# Patient Record
Sex: Female | Born: 1960
Health system: Southern US, Community
[De-identification: ages and names within clinical notes are randomized; demographics above are authoritative.]

## PROBLEM LIST (undated history)

## (undated) DIAGNOSIS — N189 Chronic kidney disease, unspecified: Secondary | ICD-10-CM

## (undated) DIAGNOSIS — Z9289 Personal history of other medical treatment: Secondary | ICD-10-CM

## (undated) DIAGNOSIS — E785 Hyperlipidemia, unspecified: Secondary | ICD-10-CM

## (undated) DIAGNOSIS — I219 Acute myocardial infarction, unspecified: Secondary | ICD-10-CM

## (undated) DIAGNOSIS — Z8489 Family history of other specified conditions: Secondary | ICD-10-CM

## (undated) DIAGNOSIS — E669 Obesity, unspecified: Secondary | ICD-10-CM

## (undated) DIAGNOSIS — I2510 Atherosclerotic heart disease of native coronary artery without angina pectoris: Secondary | ICD-10-CM

## (undated) DIAGNOSIS — I10 Essential (primary) hypertension: Secondary | ICD-10-CM

## (undated) DIAGNOSIS — G4730 Sleep apnea, unspecified: Secondary | ICD-10-CM

## (undated) DIAGNOSIS — I509 Heart failure, unspecified: Secondary | ICD-10-CM

## (undated) DIAGNOSIS — G4733 Obstructive sleep apnea (adult) (pediatric): Secondary | ICD-10-CM

## (undated) DIAGNOSIS — E119 Type 2 diabetes mellitus without complications: Secondary | ICD-10-CM

## (undated) HISTORY — DX: Obesity, unspecified: E66.9

## (undated) HISTORY — DX: Obstructive sleep apnea (adult) (pediatric): G47.33

## (undated) HISTORY — PX: COLONOSCOPY: SHX174

## (undated) NOTE — *Deleted (*Deleted)
***In Progress*** PCP:  Marcine Matar, MD         Cardiologist:  No primary care provider on file. Primary HF: Dr. Shirlee Latch  HPI:   Misty Miller is a 27 y.o. female who has poorly controlled HTN and CHF. She has had HTN and diabetes for years. She was admitted to [facility] in 2/16 with a hypertensive emergency and flash pulmonary edema. She had been off irbesartan for months. She was initially given IV furosemide x1 and started back on irbesartan. Creatinine rose from 0.95 at admission to 2.71. Irbesartan and furosemide were stopped. Echo was done, showing EF 35-40% with basal inferior akinesis and mild to moderate MR. Troponin was mildly elevated to peak 0.35. Due to elevated creatinine, she did not have cardiac cath initially. Lexiscan Cardiolite was done, showing EF 35% but no ischemia or infarction. V/Q scan was normal. BP was controlled and she was sent home. In 12/2014, she finally had cardiac cath showing severe RCA stenosis that was treated with DES. Echo in 11/2015 showed improvement in EF back to 55%, echo in 5/18 with EF 55-60%.   She returned for followup of CHF and HTN with Dr. Shirlee Latch on 04/14/20.  BP was high. She had been out of all her meds for about 2 weeks. SBP was running in the 130s when she was on her meds.  She denied exertional dyspnea or chest pain.  No orthopnea/PND. No palpitations.  She reported using CPAP occasionally but not regularly.   Today she returns to HF clinic for pharmacist medication titration. At last visit with Dr. Shirlee Latch on 04/14/20, carvedilol 25 mg BID, spironolactone 50 mg daily, hydralazine 75 mg TID, isosorbide mononitrate 60 mg daily, and furosemide 40 mg daily was re-initiated.    Overall feeling ***. Dizziness, lightheadedness, fatigue:  Chest pain or palpitations.  How is your breathing?: *** SOB Able to complete all ADLs. Activity level ***  Weight at home pounds. Takes furosemide/torsemide/bumex *** mg *** daily.  PND/Orthopnea:    Appetite ***.   HF Medications: Carvedilol 25 mg BID  Hydralazine 75 mg TID  Isosorbide mononitrate 60 mg daily  Spironolactone 50 mg daily  Furosemide 40 mg daily  Has the patient been experiencing any side effects to the medications prescribed?  {YES NO:22349}  Does the patient have any problems obtaining medications due to transportation or finances?   {YES NO:22349} Uninsured at beginning of year, supposed to have insurance per endo note  Understanding of regimen: {excellent/good/fair/poor:19665} Understanding of indications: {excellent/good/fair/poor:19665} Potential of compliance: {excellent/good/fair/poor:19665} Patient understands to avoid NSAIDs. Patient understands to avoid decongestants.    Pertinent Lab Values 04/24/20: Serum creatinine 1.19, BUN 13, Potassium 4.4, Sodium 134  Vital Signs: . Weight: *** (last clinic weight: 183 lbs) . Blood pressure: ***  . Heart rate: ***   Assessment:  1. 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 HTN has been treated and she is s/p DES to RCA, and EF on 5/17 echo was improved back to 55%. Echo in 5/18 was similar with EF 55-60%. She is not volume overloaded on exam.  -Continue furosemide 40 mg daily  - Continue spironolactone 50 mg daily  - Continue hydralazine 75 mg TID  - Continue isosorbide mononitrate 60 mg daily  2. CAD: LHC 12/23/14 showed severe proximal RCA stenosis treated with Promus DES. She was a Plavix non-responder so was transitioned to Brilinta 90 mg bid =>now off Brilinta. No chest pain.  -  Continue ASA 81.  - Continue atorvastatin 80 mg daily   3. HTN: She had marked AKI with ARB use in the past. Continue her meds as above   4. CKD: AKI in past after starting ARB, but fully recovered. No evidence for renal artery stenosis on renal artery dopplers. Suspect some baseline renal damage from long-standing HTN and  diabetes but recent creatinine within normal range   5. OSA: Asked her to be more regularly with her CPAP   6. Diabetes: She has followup with endocrinology.     Plan: 1) Medication changes: Based on clinical presentation, vital signs and recent labs will *** 2) Labs: *** 3) Follow-up: ***   Karle Plumber, PharmD, BCPS, BCCP, CPP Heart Failure Clinic Pharmacist [phone]

---

## 1990-07-11 DIAGNOSIS — Z9289 Personal history of other medical treatment: Secondary | ICD-10-CM

## 1990-07-11 HISTORY — DX: Personal history of other medical treatment: Z92.89

## 1998-01-21 ENCOUNTER — Encounter: Admission: RE | Admit: 1998-01-21 | Discharge: 1998-01-21 | Payer: Self-pay | Admitting: *Deleted

## 1999-03-12 HISTORY — PX: TUMOR EXCISION: SHX421

## 2000-12-27 ENCOUNTER — Emergency Department (HOSPITAL_COMMUNITY): Admission: EM | Admit: 2000-12-27 | Discharge: 2000-12-27 | Payer: Self-pay | Admitting: Emergency Medicine

## 2000-12-27 ENCOUNTER — Encounter: Payer: Self-pay | Admitting: Emergency Medicine

## 2001-05-12 ENCOUNTER — Encounter: Payer: Self-pay | Admitting: Emergency Medicine

## 2001-05-12 ENCOUNTER — Emergency Department (HOSPITAL_COMMUNITY): Admission: EM | Admit: 2001-05-12 | Discharge: 2001-05-12 | Payer: Self-pay | Admitting: Emergency Medicine

## 2001-10-10 ENCOUNTER — Ambulatory Visit (HOSPITAL_COMMUNITY): Admission: RE | Admit: 2001-10-10 | Discharge: 2001-10-10 | Payer: Self-pay | Admitting: Radiology

## 2002-12-16 ENCOUNTER — Emergency Department (HOSPITAL_COMMUNITY): Admission: EM | Admit: 2002-12-16 | Discharge: 2002-12-16 | Payer: Self-pay | Admitting: Emergency Medicine

## 2003-08-26 ENCOUNTER — Ambulatory Visit (HOSPITAL_COMMUNITY): Admission: RE | Admit: 2003-08-26 | Discharge: 2003-08-26 | Payer: Self-pay | Admitting: Family Medicine

## 2003-11-07 ENCOUNTER — Ambulatory Visit (HOSPITAL_COMMUNITY): Admission: RE | Admit: 2003-11-07 | Discharge: 2003-11-07 | Payer: Self-pay | Admitting: Family Medicine

## 2004-08-22 ENCOUNTER — Emergency Department (HOSPITAL_COMMUNITY): Admission: EM | Admit: 2004-08-22 | Discharge: 2004-08-22 | Payer: Self-pay | Admitting: Emergency Medicine

## 2005-04-08 ENCOUNTER — Other Ambulatory Visit: Admission: RE | Admit: 2005-04-08 | Discharge: 2005-04-08 | Payer: Self-pay | Admitting: Family Medicine

## 2005-05-26 ENCOUNTER — Ambulatory Visit (HOSPITAL_COMMUNITY): Admission: RE | Admit: 2005-05-26 | Discharge: 2005-05-26 | Payer: Self-pay | Admitting: General Surgery

## 2005-05-26 ENCOUNTER — Encounter (INDEPENDENT_AMBULATORY_CARE_PROVIDER_SITE_OTHER): Payer: Self-pay | Admitting: *Deleted

## 2005-05-26 ENCOUNTER — Ambulatory Visit (HOSPITAL_BASED_OUTPATIENT_CLINIC_OR_DEPARTMENT_OTHER): Admission: RE | Admit: 2005-05-26 | Discharge: 2005-05-26 | Payer: Self-pay | Admitting: General Surgery

## 2005-07-27 ENCOUNTER — Encounter (INDEPENDENT_AMBULATORY_CARE_PROVIDER_SITE_OTHER): Payer: Self-pay | Admitting: Specialist

## 2005-07-27 ENCOUNTER — Ambulatory Visit (HOSPITAL_BASED_OUTPATIENT_CLINIC_OR_DEPARTMENT_OTHER): Admission: RE | Admit: 2005-07-27 | Discharge: 2005-07-27 | Payer: Self-pay | Admitting: General Surgery

## 2005-07-27 ENCOUNTER — Observation Stay (HOSPITAL_COMMUNITY): Admission: EM | Admit: 2005-07-27 | Discharge: 2005-07-28 | Payer: Self-pay | Admitting: Emergency Medicine

## 2006-01-30 ENCOUNTER — Encounter: Admission: RE | Admit: 2006-01-30 | Discharge: 2006-01-30 | Payer: Self-pay | Admitting: Family Medicine

## 2006-09-18 ENCOUNTER — Ambulatory Visit (HOSPITAL_COMMUNITY): Admission: RE | Admit: 2006-09-18 | Discharge: 2006-09-18 | Payer: Self-pay | Admitting: Internal Medicine

## 2006-10-04 ENCOUNTER — Encounter: Admission: RE | Admit: 2006-10-04 | Discharge: 2006-10-04 | Payer: Self-pay | Admitting: Internal Medicine

## 2009-03-24 ENCOUNTER — Emergency Department (HOSPITAL_COMMUNITY): Admission: EM | Admit: 2009-03-24 | Discharge: 2009-03-24 | Payer: Self-pay | Admitting: Emergency Medicine

## 2010-07-31 ENCOUNTER — Encounter: Payer: Self-pay | Admitting: Family Medicine

## 2010-10-15 LAB — POCT I-STAT, CHEM 8
BUN: 12 mg/dL (ref 6–23)
Calcium, Ion: 1.16 mmol/L (ref 1.12–1.32)
Chloride: 104 mEq/L (ref 96–112)
Creatinine, Ser: 0.9 mg/dL (ref 0.4–1.2)
Glucose, Bld: 131 mg/dL — ABNORMAL HIGH (ref 70–99)
HCT: 39 % (ref 36.0–46.0)
Hemoglobin: 13.3 g/dL (ref 12.0–15.0)
Potassium: 3.5 mEq/L (ref 3.5–5.1)
Sodium: 139 mEq/L (ref 135–145)
TCO2: 26 mmol/L (ref 0–100)

## 2010-11-26 NOTE — Op Note (Signed)
NAME:  Misty Miller, Misty Miller              ACCOUNT NO.:  [id number]   MEDICAL RECORD NO.:  [id number]          PATIENT TYPE:  AMB   LOCATION:  [facility]                         FACILITY:  [facility]   PHYSICIAN:  Leonie Man, M.D.   DATE OF BIRTH:  1960/10/04   DATE OF PROCEDURE:  05/26/2005  DATE OF DISCHARGE:                                 OPERATIVE REPORT   PREOPERATIVE DIAGNOSIS:  Lipoma right thigh.   POSTOPERATIVE DIAGNOSIS:  Lipoma right thigh.   PROCEDURES:  Excision lipoma right thigh.   SURGEON:  Leonie Man, MD.   ASSISTANT:  OR tech.   ANESTHESIA:  General.   [name] is a 50 year old woman with an enlarging mass in the anterolateral  right thigh. This does not cause any significant discomfort, but does cause  significant cosmetic deformity for her. She comes to the operating room now  for removal of this mass after the risks and potential benefits of surgery  have been discussed. All questions answered and consent obtained.   Following the induction of satisfactory general anesthesia, the patient  positioned supinely and the right thigh is prepped and draped to be included  in a sterile operative field. A transverse incision is made over the mass  deep and through the skin and subcutaneous tissue, carried down to the  capsule of the lipoma. The lipoma was a multiloculated mesh which was  dissected free on all sides removed from off of the fascia of the rectus  femoris muscle. This was removed and forwarded for pathologic evaluation.  The size of mass was approximately 8 cm in greatest diameter. Hemostasis was  obtained with electrocautery and the wound was then irrigated with normal  saline. Sponge and instrument counts verified. The wound closed then in  layers with 3-0 Vicryl in the subcutaneous tissues and 4-0 Monocryl closing  the skin. The wound is reinforced with Steri-Strips and a sterile  compressive dressing applied. Anesthetic reversed and the patient removed  from the operating room to the recovery room in stable condition. She  tolerated the procedure well.      Leonie Man, M.D.  Electronically Signed     PB/MEDQ  D:  05/26/2005  T:  05/26/2005  Job:  [id number]

## 2010-11-26 NOTE — Op Note (Signed)
NAME:  Misty Miller, Misty Miller              ACCOUNT NO.:  [id number]   MEDICAL RECORD NO.:  [id number]          PATIENT TYPE:  AMB   LOCATION:  [facility]                         FACILITY:  [facility]   PHYSICIAN:  Leonie Man, M.D.   DATE OF BIRTH:  1960/07/16   DATE OF PROCEDURE:  07/27/2005  DATE OF DISCHARGE:                                 OPERATIVE REPORT   PREOPERATIVE DIAGNOSIS:  Hematoma right thigh.   POSTOPERATIVE DIAGNOSIS:  Hematoma right thigh.   PROCEDURE:  Evacuation of hematoma and control of bleeding from right thigh.   SURGEON:  Leonie Man, M.D.   ASSISTANT:  OR tech.   ANESTHESIA:  General.   SPECIMENS:  There are no specimens sent to the lab.   ESTIMATED BLOOD LOSS:  Minimal.   COMPLICATIONS:  There are no complications. The patient returned to the PACU  in good condition.   HISTORY OF PRESENT ILLNESS:  [name] is a 50 year old patient who  underwent wide excision of a precancerous lipoma of her right thigh earlier  today. After going home, she noted significant swelling and bleeding from  her dressing. She was seen and in the emergency room and noted to have a  large hematoma within the thigh. She comes to the operating room now for  evacuation and control of bleeding after the risks and potential benefits of  surgery have been discussed.   DESCRIPTION OF PROCEDURE:  Following the induction of satisfactory general  anesthesia, the patient is positioned supinely and the right thigh is  prepped and draped to be included in a sterile operative field. The incision  is then reopened taking out the two layers of sutures holding the incision  together and the large hematoma is then evacuated in its entirety and the  wound cavity irrigated with multiple aliquots of saline. In inspection of  the wound, there was a solitary small bleeder against the rectus femoris  muscle which was bleeding. This was suture ligated with a 3-0 Vicryl suture.  There are no other bleeding  points noted. The wound was again thoroughly  irrigated. Sponge, instrument and sharp counts were verified and it was  closed in two layers with interrupted 3-0 Vicryl  sutures in the subcutaneous tissues and running 4-0 Monocryl suture in the  skin. This was then reinforced with Steri-Strips, sterile dressings were  applied, the anesthetic reversed and the patient removed from the operating  room to the recovery room in stable condition. She tolerated the procedure  well.      Leonie Man, M.D.  Electronically Signed     PB/MEDQ  D:  07/27/2005  T:  07/28/2005  Job:  [id number]

## 2010-11-26 NOTE — Op Note (Signed)
NAME:  [name], [name]              ACCOUNT NO.:  [id number]   MEDICAL RECORD NO.:  [id number]          PATIENT TYPE:  AMB   LOCATION:  [facility]                         FACILITY:  [facility]   PHYSICIAN:  Leonie Man, M.D.   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   POSTOPERATIVE DIAGNOSIS:  Atypical lipoma of right thigh incompletely  excised.   PROCEDURE:  Re-excision of a typical lipoma right thigh for complete  excision.   SURGEON:  Leonie Man, M.D.   ASSISTANT:  Nurse.   ANESTHESIA:  General.   SPECIMENS TO LAB:  Skin, fascia and prior excision site.   ESTIMATED BLOOD LOSS:  Minimal.   COMPLICATIONS:  None. The patient returned to the PACU in excellent  condition.   INDICATIONS:  Ms. Misty Miller is a 50 year old female who underwent  excision of what was felt to be lipoma of the right thigh. Previously on  pathology report, she is noted to have an atypical lipoma with transection  at some of the margins. She returned to the operating room now for re-  excision of this area so as to get complete normal margins around this area.  She understands the risks and potential benefits of surgery and gives her  consent.   DESCRIPTION OF PROCEDURE:  Following the induction of satisfactory general  anesthesia, the patient is positioned supinely and the left thigh is prepped  and draped to be included in a sterile field. An elliptical incision is made  around the old scar deep and through the skin and subcutaneous tissues and  raising flaps both superiorly and inferiorly and dissecting down around the  entire previous excision site carrying the dissection down to the fascia of  the rectus murmurs muscles. A portion of that fascia was taken along with  the specimen and removed and forwarded for pathologic  evaluation and the  specimen was marked at the 12 o'clock and 9 o'clock position with sutures.  Hemostasis was obtained with electrocautery. Sponge, instrument and sharp  counts verified. The subcutaneous  tissue was reapproximated with interrupted 2-0 Vicryl sutures. Skin closed  with running 4-0 Monocryl suture and then reinforced with Steri-Strips.  Sterile dressings applied. Anesthetic reversed. The patient removed from the  operating room to the recovery room in stable condition. She tolerated the  procedure well.      Leonie Man, M.D.  Electronically Signed     PB/MEDQ  D:  07/27/2005  T:  07/27/2005  Job:  [id number]

## 2011-09-16 ENCOUNTER — Encounter (HOSPITAL_COMMUNITY): Payer: Self-pay

## 2011-09-16 ENCOUNTER — Emergency Department (HOSPITAL_COMMUNITY)
Admission: EM | Admit: 2011-09-16 | Discharge: 2011-09-16 | Disposition: A | Discharge status: Home Health | Payer: Self-pay | Attending: Emergency Medicine | Admitting: Emergency Medicine

## 2011-09-16 ENCOUNTER — Other Ambulatory Visit: Payer: Self-pay

## 2011-09-16 DIAGNOSIS — R609 Edema, unspecified: Secondary | ICD-10-CM | POA: Insufficient documentation

## 2011-09-16 DIAGNOSIS — I10 Essential (primary) hypertension: Secondary | ICD-10-CM | POA: Insufficient documentation

## 2011-09-16 DIAGNOSIS — M79609 Pain in unspecified limb: Secondary | ICD-10-CM | POA: Insufficient documentation

## 2011-09-16 DIAGNOSIS — Z79899 Other long term (current) drug therapy: Secondary | ICD-10-CM | POA: Insufficient documentation

## 2011-09-16 HISTORY — DX: Essential (primary) hypertension: I10

## 2011-09-16 LAB — POCT I-STAT, CHEM 8
BUN: 12 mg/dL (ref 6–23)
Calcium, Ion: 1.14 mmol/L (ref 1.12–1.32)
Chloride: 104 mEq/L (ref 96–112)
Creatinine, Ser: 0.8 mg/dL (ref 0.50–1.10)
Glucose, Bld: 107 mg/dL — ABNORMAL HIGH (ref 70–99)
HCT: 38 % (ref 36.0–46.0)
Hemoglobin: 12.9 g/dL (ref 12.0–15.0)
Potassium: 4.2 mEq/L (ref 3.5–5.1)
Sodium: 140 mEq/L (ref 135–145)
TCO2: 30 mmol/L (ref 0–100)

## 2011-09-16 LAB — URINALYSIS, ROUTINE W REFLEX MICROSCOPIC
Bilirubin Urine: NEGATIVE
Glucose, UA: NEGATIVE mg/dL
Hgb urine dipstick: NEGATIVE
Ketones, ur: NEGATIVE mg/dL
Leukocytes, UA: NEGATIVE
Nitrite: NEGATIVE
Protein, ur: NEGATIVE mg/dL
Specific Gravity, Urine: 1.024 (ref 1.005–1.030)
Urobilinogen, UA: 0.2 mg/dL (ref 0.0–1.0)
pH: 5.5 (ref 5.0–8.0)

## 2011-09-16 NOTE — ED Notes (Signed)
Patient presents with edema to bilateral arms and hands x 3 weeks with left arm pain upon movement. Patient denies chest pain, SOB, n/v, headache.  Patient reports arm pain is worse upon movement.

## 2011-09-16 NOTE — ED Provider Notes (Signed)
History     CSN: [id number]  Arrival date & time 09/16/11  1258   First MD Initiated Contact with Patient 09/16/11 1534      Chief Complaint  Patient presents with  . Edema    (Consider location/radiation/quality/duration/timing/severity/associated sxs/prior treatment) HPI Patient presents with complaint of bilateral upper extremity swelling primarily in her hands. She states this has been going on for 3-4 weeks. 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 She's had no lower extremity swelling. No shortness of breath or chest pain. She does take blood pressure medications but states that she's never had good control of her blood pressure despite taking these meds. She denies any nausea or vomiting any dizziness lightheadedness no changes in her vision and no focal weakness. There no other associated systemic symptoms. Symptoms have been continuous. There are no alleviating or modifying factors.  Past Medical History  Diagnosis Date  . Hypertension     History reviewed. No pertinent past surgical history.  Family History  Problem Relation Age of Onset  . Cancer Mother     History  Substance Use Topics  . Smoking status: Never Smoker   . Smokeless tobacco: Not on file  . Alcohol Use: No    OB History    Grav Para Term Preterm Abortions TAB SAB Ect Mult Living                  Review of Systems ROS reviewed and otherwise negative except for mentioned in HPI  Allergies  Review of patient's allergies indicates no known allergies.  Home Medications   Current Outpatient Rx  Name Route Sig Dispense Refill  . HYDROCHLOROTHIAZIDE 25 MG PO TABS Oral Take 25 mg by mouth daily.    Marland Kitchen LABETALOL HCL 200 MG PO TABS Oral Take 200 mg by mouth 2 (two) times daily.    Marland Kitchen LISINOPRIL 10 MG PO TABS Oral Take 10 mg by mouth daily.      BP 215/123  Pulse 83  Temp(Src) 98.3 F (36.8 C) (Oral)  Resp 20  SpO2 98%   LMP 08/26/2011 Vitals reviewed Physical Exam Physical Examination: General appearance - alert, well appearing, and in no distress Mental status - alert, oriented to person, place, and time Mouth - mucous membranes moist, pharynx normal without lesions Chest - clear to auscultation, no wheezes, rales or rhonchi, symmetric air entry Heart - normal rate, regular rhythm, normal S1, S2, no murmurs, rubs, clicks or gallops Abdomen - soft, nontender, nondistended, no masses or organomegaly Neurological - alert, oriented, normal speech, no focal findings, strength 5/5 in extremities x 4 Musculoskeletal - mild ttp over bilateral lateral upper extremities, no focal area of point tenderness, no deformity or swelling Extremities - peripheral pulses normal, no pedal edema, no clubbing or cyanosis, some soft tissue swelling of bilateral hands- nonpitting Skin - normal coloration and turgor, no rashes, no suspicious skin lesions noted  ED Course  Procedures (including critical care time)  Date: 09/16/2011  Rate: 82  Rhythm: normal sinus rhythm  QRS Axis: normal  Intervals: normal  ST/T Wave abnormalities: normal  Conduction Disutrbances:none  Narrative Interpretation: poor r wave progression, unchanged from prior  Old EKG Reviewed: unchanged    Labs Reviewed  POCT I-STAT, CHEM 8 - Abnormal; Notable for the following:    Glucose, Bld 107 (*)    All other components within normal limits  URINALYSIS, ROUTINE W REFLEX  MICROSCOPIC   No results found.   1. Hypertension       MDM  Patient presenting with concern for bilateral upper extremity hand and arm swelling which has been going on for several weeks. She also has hypertension but denies any symptoms related to that and states that her blood pressure has been chronically elevated for quite some time despite taking her prescribed blood pressure medications. Her EKG is unchanged from prior and her urine and i-STAT revealed normal kidney  function. Her neurologic exam is normal. There is no asymmetry of the swelling to suggest DVT. It is unclear what is causing her swelling and may be related to some general fluid overload which may benefit from an increase in her diuretic or change in diuretic. She was advised to arrange for followup with her primary care Dr. to make this medication change and to assist her with better control of her blood pressure. Patient is overall nontoxic and well-hydrated in appearance and is agreeable in this plan to arrange for followup as an outpatient she was also requested the number of some primary doctors which I have provided.        Ethelda Chick, MD 09/16/11 1740

## 2011-09-16 NOTE — Discharge Instructions (Signed)
Return to the ED with any concerns including difficulty breathing, chest pain, changes in vision, confusion, weakness or numbness of one arm or one leg, decreased level of alertness or lethargy, or any other alarming symptoms.

## 2011-09-16 NOTE — ED Notes (Signed)
Patient has hypertension despite taking her blood pressure medication. Patient has not seen PCP for 1 year.

## 2014-08-16 ENCOUNTER — Encounter (HOSPITAL_COMMUNITY): Payer: Self-pay | Admitting: *Deleted

## 2014-08-16 ENCOUNTER — Inpatient Hospital Stay (HOSPITAL_COMMUNITY)
Admission: EM | Admit: 2014-08-16 | Discharge: 2014-08-20 | DRG: 291 | Disposition: A | Discharge status: Home / Self Care | Payer: Managed Care, Other (non HMO) | Attending: Internal Medicine | Admitting: Internal Medicine

## 2014-08-16 ENCOUNTER — Emergency Department (HOSPITAL_COMMUNITY): Payer: Managed Care, Other (non HMO)

## 2014-08-16 DIAGNOSIS — I509 Heart failure, unspecified: Secondary | ICD-10-CM

## 2014-08-16 DIAGNOSIS — J810 Acute pulmonary edema: Secondary | ICD-10-CM

## 2014-08-16 DIAGNOSIS — D649 Anemia, unspecified: Secondary | ICD-10-CM | POA: Diagnosis present

## 2014-08-16 DIAGNOSIS — E1165 Type 2 diabetes mellitus with hyperglycemia: Secondary | ICD-10-CM | POA: Diagnosis present

## 2014-08-16 DIAGNOSIS — I2699 Other pulmonary embolism without acute cor pulmonale: Secondary | ICD-10-CM

## 2014-08-16 DIAGNOSIS — E785 Hyperlipidemia, unspecified: Secondary | ICD-10-CM | POA: Diagnosis present

## 2014-08-16 DIAGNOSIS — N179 Acute kidney failure, unspecified: Secondary | ICD-10-CM | POA: Diagnosis present

## 2014-08-16 DIAGNOSIS — E669 Obesity, unspecified: Secondary | ICD-10-CM | POA: Diagnosis present

## 2014-08-16 DIAGNOSIS — Z79899 Other long term (current) drug therapy: Secondary | ICD-10-CM | POA: Diagnosis not present

## 2014-08-16 DIAGNOSIS — Z6837 Body mass index (BMI) 37.0-37.9, adult: Secondary | ICD-10-CM | POA: Diagnosis not present

## 2014-08-16 DIAGNOSIS — J9691 Respiratory failure, unspecified with hypoxia: Secondary | ICD-10-CM | POA: Diagnosis present

## 2014-08-16 DIAGNOSIS — R079 Chest pain, unspecified: Secondary | ICD-10-CM

## 2014-08-16 DIAGNOSIS — IMO0002 Reserved for concepts with insufficient information to code with codable children: Secondary | ICD-10-CM

## 2014-08-16 DIAGNOSIS — I161 Hypertensive emergency: Secondary | ICD-10-CM

## 2014-08-16 DIAGNOSIS — Z9189 Other specified personal risk factors, not elsewhere classified: Secondary | ICD-10-CM

## 2014-08-16 DIAGNOSIS — Z9119 Patient's noncompliance with other medical treatment and regimen: Secondary | ICD-10-CM | POA: Diagnosis present

## 2014-08-16 DIAGNOSIS — N189 Chronic kidney disease, unspecified: Secondary | ICD-10-CM

## 2014-08-16 DIAGNOSIS — R0902 Hypoxemia: Secondary | ICD-10-CM

## 2014-08-16 DIAGNOSIS — I10 Essential (primary) hypertension: Secondary | ICD-10-CM

## 2014-08-16 DIAGNOSIS — I219 Acute myocardial infarction, unspecified: Secondary | ICD-10-CM

## 2014-08-16 HISTORY — DX: Heart failure, unspecified: I50.9

## 2014-08-16 HISTORY — DX: Acute myocardial infarction, unspecified: I21.9

## 2014-08-16 HISTORY — DX: Chronic kidney disease, unspecified: N18.9

## 2014-08-16 LAB — CBC WITH DIFFERENTIAL/PLATELET
Basophils Absolute: 0 10*3/uL (ref 0.0–0.1)
Basophils Relative: 0 % (ref 0–1)
Eosinophils Absolute: 0.2 10*3/uL (ref 0.0–0.7)
Eosinophils Relative: 2 % (ref 0–5)
HCT: 39.8 % (ref 36.0–46.0)
Hemoglobin: 13.2 g/dL (ref 12.0–15.0)
Lymphocytes Relative: 41 % (ref 12–46)
Lymphs Abs: 4.2 10*3/uL — ABNORMAL HIGH (ref 0.7–4.0)
MCH: 27.8 pg (ref 26.0–34.0)
MCHC: 33.2 g/dL (ref 30.0–36.0)
MCV: 83.8 fL (ref 78.0–100.0)
Monocytes Absolute: 0.5 10*3/uL (ref 0.1–1.0)
Monocytes Relative: 4 % (ref 3–12)
Neutro Abs: 5.4 10*3/uL (ref 1.7–7.7)
Neutrophils Relative %: 53 % (ref 43–77)
Platelets: 306 10*3/uL (ref 150–400)
RBC: 4.75 MIL/uL (ref 3.87–5.11)
RDW: 14.9 % (ref 11.5–15.5)
WBC: 10.3 10*3/uL (ref 4.0–10.5)

## 2014-08-16 LAB — GLUCOSE, CAPILLARY
Glucose-Capillary: 133 mg/dL — ABNORMAL HIGH (ref 70–99)
Glucose-Capillary: 196 mg/dL — ABNORMAL HIGH (ref 70–99)

## 2014-08-16 LAB — COMPREHENSIVE METABOLIC PANEL
ALT: 15 U/L (ref 0–35)
AST: 24 U/L (ref 0–37)
Albumin: 3.5 g/dL (ref 3.5–5.2)
Alkaline Phosphatase: 98 U/L (ref 39–117)
Anion gap: 11 (ref 5–15)
BUN: 14 mg/dL (ref 6–23)
CO2: 20 mmol/L (ref 19–32)
Calcium: 8.3 mg/dL — ABNORMAL LOW (ref 8.4–10.5)
Chloride: 104 mmol/L (ref 96–112)
Creatinine, Ser: 0.95 mg/dL (ref 0.50–1.10)
GFR calc Af Amer: 78 mL/min — ABNORMAL LOW (ref >90)
GFR calc non Af Amer: 67 mL/min — ABNORMAL LOW (ref >90)
Glucose, Bld: 297 mg/dL — ABNORMAL HIGH (ref 70–99)
Potassium: 3.2 mmol/L — ABNORMAL LOW (ref 3.5–5.1)
Sodium: 135 mmol/L (ref 135–145)
Total Bilirubin: 1 mg/dL (ref 0.3–1.2)
Total Protein: 7.1 g/dL (ref 6.0–8.3)

## 2014-08-16 LAB — LIPID PANEL
Cholesterol: 224 mg/dL — ABNORMAL HIGH (ref 0–200)
HDL: 51 mg/dL (ref >39)
LDL Cholesterol: 149 mg/dL — ABNORMAL HIGH (ref 0–99)
Total CHOL/HDL Ratio: 4.4 RATIO
Triglycerides: 122 mg/dL (ref <150)
VLDL: 24 mg/dL (ref 0–40)

## 2014-08-16 LAB — TROPONIN I
Troponin I: 0.03 ng/mL (ref <0.031)
Troponin I: 0.21 ng/mL — ABNORMAL HIGH (ref <0.031)
Troponin I: 0.29 ng/mL — ABNORMAL HIGH (ref <0.031)

## 2014-08-16 LAB — I-STAT ARTERIAL BLOOD GAS, ED
Acid-base deficit: 4 mmol/L — ABNORMAL HIGH (ref 0.0–2.0)
Bicarbonate: 21.5 mEq/L (ref 20.0–24.0)
O2 Saturation: 91 %
Patient temperature: 98.6
TCO2: 23 mmol/L (ref 0–100)
pCO2 arterial: 40.4 mmHg (ref 35.0–45.0)
pH, Arterial: 7.335 — ABNORMAL LOW (ref 7.350–7.450)
pO2, Arterial: 66 mmHg — ABNORMAL LOW (ref 80.0–100.0)

## 2014-08-16 LAB — CBG MONITORING, ED
Glucose-Capillary: 206 mg/dL — ABNORMAL HIGH (ref 70–99)
Glucose-Capillary: 188 mg/dL — ABNORMAL HIGH (ref 70–99)
Glucose-Capillary: 179 mg/dL — ABNORMAL HIGH (ref 70–99)

## 2014-08-16 LAB — I-STAT CG4 LACTIC ACID, ED: Lactic Acid, Venous: 2.28 mmol/L (ref 0.5–2.0)

## 2014-08-16 LAB — TSH: TSH: 1.122 u[IU]/mL (ref 0.350–4.500)

## 2014-08-16 LAB — BRAIN NATRIURETIC PEPTIDE: B Natriuretic Peptide: 158.2 pg/mL — ABNORMAL HIGH (ref 0.0–100.0)

## 2014-08-16 LAB — D-DIMER, QUANTITATIVE: D-Dimer, Quant: 2.99 ug/mL-FEU — ABNORMAL HIGH (ref 0.00–0.48)

## 2014-08-16 LAB — MRSA PCR SCREENING: MRSA by PCR: NEGATIVE

## 2014-08-16 MED ORDER — INSULIN GLARGINE 100 UNIT/ML ~~LOC~~ SOLN
10.0000 [IU] | Freq: Every day | SUBCUTANEOUS | Status: DC
Start: 2014-08-16 — End: 2014-08-17
  Administered 2014-08-16 – 2014-08-17 (×2): 10 [IU] via SUBCUTANEOUS
  Filled 2014-08-16 (×4): qty 0.1

## 2014-08-16 MED ORDER — SODIUM CHLORIDE 0.9 % IJ SOLN
3.0000 mL | Freq: Two times a day (BID) | INTRAMUSCULAR | Status: DC
  Administered 2014-08-16 – 2014-08-19 (×7): 3 mL via INTRAVENOUS

## 2014-08-16 MED ORDER — ACETAMINOPHEN 325 MG PO TABS
650.0000 mg | ORAL_TABLET | Freq: Four times a day (QID) | ORAL | Status: DC | PRN
  Administered 2014-08-17: 650 mg via ORAL
  Filled 2014-08-16: qty 2

## 2014-08-16 MED ORDER — NITROGLYCERIN IN D5W 200-5 MCG/ML-% IV SOLN
10.0000 ug/min | INTRAVENOUS | Status: DC
  Administered 2014-08-16: 100 ug/min via INTRAVENOUS
  Filled 2014-08-16: qty 250

## 2014-08-16 MED ORDER — INSULIN ASPART 100 UNIT/ML ~~LOC~~ SOLN
0.0000 [IU] | Freq: Three times a day (TID) | SUBCUTANEOUS | Status: DC
  Administered 2014-08-16: 2 [IU] via SUBCUTANEOUS
  Administered 2014-08-16: 3 [IU] via SUBCUTANEOUS
  Administered 2014-08-16: 5 [IU] via SUBCUTANEOUS
  Administered 2014-08-17: 3 [IU] via SUBCUTANEOUS
  Administered 2014-08-17 – 2014-08-20 (×4): 2 [IU] via SUBCUTANEOUS
  Filled 2014-08-16 (×2): qty 1

## 2014-08-16 MED ORDER — ACETAMINOPHEN 650 MG RE SUPP: 650.0000 mg | Freq: Four times a day (QID) | RECTAL | Status: DC | PRN

## 2014-08-16 MED ORDER — FUROSEMIDE 10 MG/ML IJ SOLN
40.0000 mg | Freq: Once | INTRAMUSCULAR | Status: AC
  Administered 2014-08-16: 40 mg via INTRAVENOUS
  Filled 2014-08-16: qty 4

## 2014-08-16 MED ORDER — LINAGLIPTIN 5 MG PO TABS
5.0000 mg | ORAL_TABLET | Freq: Every day | ORAL | Status: DC
  Administered 2014-08-16 – 2014-08-20 (×5): 5 mg via ORAL
  Filled 2014-08-16 (×5): qty 1

## 2014-08-16 MED ORDER — ATORVASTATIN CALCIUM 20 MG PO TABS
20.0000 mg | ORAL_TABLET | Freq: Every day | ORAL | Status: DC
  Administered 2014-08-16 – 2014-08-19 (×4): 20 mg via ORAL
  Filled 2014-08-16 (×6): qty 1

## 2014-08-16 MED ORDER — LABETALOL HCL 200 MG PO TABS
200.0000 mg | ORAL_TABLET | Freq: Three times a day (TID) | ORAL | Status: DC
  Administered 2014-08-16 – 2014-08-19 (×9): 200 mg via ORAL
  Filled 2014-08-16 (×15): qty 1

## 2014-08-16 MED ORDER — ASPIRIN EC 81 MG PO TBEC
81.0000 mg | DELAYED_RELEASE_TABLET | Freq: Every day | ORAL | Status: DC
  Administered 2014-08-16 – 2014-08-20 (×5): 81 mg via ORAL
  Filled 2014-08-16 (×5): qty 1

## 2014-08-16 MED ORDER — ALBUTEROL SULFATE (2.5 MG/3ML) 0.083% IN NEBU
INHALATION_SOLUTION | RESPIRATORY_TRACT | Status: AC
  Administered 2014-08-16: 5 mg
  Filled 2014-08-16: qty 6

## 2014-08-16 MED ORDER — ONDANSETRON HCL 4 MG/2ML IJ SOLN
4.0000 mg | Freq: Four times a day (QID) | INTRAMUSCULAR | Status: DC | PRN
  Filled 2014-08-16: qty 2

## 2014-08-16 MED ORDER — SODIUM CHLORIDE 0.9 % IV SOLN
INTRAVENOUS | Status: DC
  Administered 2014-08-16: 07:00:00 via INTRAVENOUS

## 2014-08-16 MED ORDER — ASPIRIN 81 MG PO CHEW
324.0000 mg | CHEWABLE_TABLET | Freq: Once | ORAL | Status: AC
  Administered 2014-08-16: 324 mg via ORAL
  Filled 2014-08-16: qty 4

## 2014-08-16 MED ORDER — ALBUTEROL SULFATE (2.5 MG/3ML) 0.083% IN NEBU: 5.0000 mg | INHALATION_SOLUTION | Freq: Once | RESPIRATORY_TRACT | Status: DC

## 2014-08-16 MED ORDER — ONDANSETRON HCL 4 MG PO TABS: 4.0000 mg | ORAL_TABLET | Freq: Four times a day (QID) | ORAL | Status: DC | PRN

## 2014-08-16 MED ORDER — IRBESARTAN 300 MG PO TABS
300.0000 mg | ORAL_TABLET | Freq: Every day | ORAL | Status: DC
  Administered 2014-08-17: 300 mg via ORAL
  Filled 2014-08-16 (×2): qty 1

## 2014-08-16 MED ORDER — ONDANSETRON HCL 4 MG/2ML IJ SOLN
4.0000 mg | Freq: Once | INTRAMUSCULAR | Status: AC
  Administered 2014-08-16: 4 mg via INTRAVENOUS
  Filled 2014-08-16: qty 2

## 2014-08-16 MED ORDER — ENOXAPARIN SODIUM 40 MG/0.4ML ~~LOC~~ SOLN
40.0000 mg | Freq: Every day | SUBCUTANEOUS | Status: DC
  Administered 2014-08-16 – 2014-08-17 (×2): 40 mg via SUBCUTANEOUS
  Filled 2014-08-16 (×2): qty 0.4

## 2014-08-16 NOTE — H&P (Signed)
Misty Miller is an 54 y.o. female.   PCP:   Default, Provider, MD   Chief Complaint:  Flash Pulm Edema, CHF, HTN Emergency, Hypoxia, N.  HPI: 17 F c PMH c/w DM2, Lipids, obesity and HTN.  Dxed c HTN 1996 c Pre-eclampsia and has been on 3-4 drugs for control lately.  She has not seen Dr Wylene Simmer since 04/20/13.  In fact 4 of last 6 appts on our EMR were No Shows.  She reports lost her job and insurance and had no money.  Somehow she continued to take her meds.  She reports she had been feeling well up until Wednesday.  Since then she had progressive DOE.  She reports SBPs at home 140-200.  Denies recent CP, sig LE, edema or other Sxs.  She said she saw a cardiologist and had ECHO in past but could not tell me name, yr, or practice.  I could find nothing in our EMR or on EPIC.  Tonight @ 12:30 am she was brought in by ambulance with Hypoxic Resp Failurte/respiratory distress that began prior to arrival. + shortness of breath, wheezing and productive cough with clear sputum that initially began yesterday and suddenly worsened prior to arrival. She reports associated mild edema of legs bilaterally onset yesterday. She denies associated fever, chills, or chest pain. She denies PMHx of COPD, bhronchitis, emphysema. She denies prior history of heart problems. Patient states she last took her BP medication less than 24 hours ago. EKG showed Sinus Tachy, Lungs sounded wet, sats were low.  EMS gave Nitro x 2. Albuterol 5mg . 94% on Bi-Pap. 178/110 CBG 240. 20G LFA.  ABG was OK.  Cr was only 0.95. Trop I was (-), BNP was 158.  CXR c/w Vascular congestion and pulmonary edema. Likely pleural effusions. Findings suggest CHF.  EDP discussed case c Cards and CCM and recommended admit buy med/PCP.  She was placed on NTG gtt.  Bipap was continued and converted to NRB mask.  She had Emesis.  After awhile Sats stabilized.  Currently in ED she is not in distress.  She is talking full and complete sentences unlabored and doing  better.  NO current CP  Past Medical History:  Past Medical History  Diagnosis Date  . Hypertension     History reviewed. No pertinent past surgical history.    Allergies:  No Known Allergies   Medications: Prior to Admission medications   Medication Sig Start Date End Date Taking? Authorizing Provider  amoxicillin (AMOXIL) 500 MG capsule Take 500-1,000 mg by mouth every 6 (six) hours. Take 1000mg  on day one and 500mg  every 6 hours until gone   Yes Historical Provider, MD  hydrochlorothiazide (HYDRODIURIL) 25 MG tablet Take 25 mg by mouth daily.   Yes Historical Provider, MD  labetalol (NORMODYNE) 200 MG tablet Take 200 mg by mouth 2 (two) times daily.   Yes Historical Provider, MD  lisinopril (PRINIVIL,ZESTRIL) 10 MG tablet Take 10 mg by mouth daily.    Historical Provider, MD      (Not in a hospital admission)   Social History:  reports that she has never smoked. She does not have any smokeless tobacco history on file. She reports that she does not drink alcohol or use illicit drugs.  Family History: Family History  Problem Relation Age of Onset  . Cancer Mother     Review of Systems:  Review of Systems - See HPI.  + SOB/DOE/Cough/Sputum/Min LE edema. (-) CP. Nausea improving. All other ROS obtained  and (-)   Physical Exam:  Blood pressure 126/81, pulse 92, temperature 97.2 F (36.2 C), temperature source Oral, resp. rate 12, height  (1.575 m), weight 90.719 kg (200 lb), SpO2 95 %. Filed Vitals:   08/16/14 0500 08/16/14 0530 08/16/14 0545 08/16/14 0550  BP: 145/80 146/102 126/81   Pulse: 85 87 92   Temp:    97.2 F (36.2 C)  TempSrc:    Oral  Resp: Height:      Weight:      SpO2: 91% 98% 95%    General appearance: A and O.  Wearing Non-Rebreather Mask Head: Normocephalic, without obvious abnormality, atraumatic Eyes: conjunctivae/corneas clear. PERRL, EOM's intact.  Nose: Nares normal. Septum midline. Mucosa normal. No drainage or  sinus tenderness. Throat: lips, mucosa, and tongue normal; teeth and gums normal Neck: Thick.  Sitting up.  No obvious JVD Resp: Rales L>R Cardio: Reg s m GI: Overweight, soft, non-tender; bowel sounds normal; no masses,  no organomegaly Extremities: min edema and equal Bilaterally Pulses: 2+ and symmetric Lymph nodes: no cervical lymphadenopathy Neurologic: Alert and oriented X 3, normal strength and tone. Normal symmetric reflexes.     Labs on Admission:   Recent Labs  08/16/14 0254  NA 135  K 3.2*  CL 104  CO2 20  GLUCOSE 297*  BUN 14  CREATININE 0.95  CALCIUM 8.3*    Recent Labs  08/16/14 0254  AST 24  ALT 15  ALKPHOS 98  BILITOT 1.0  PROT 7.1  ALBUMIN 3.5   No results for input(s): LIPASE, AMYLASE in the last 72 hours.  Recent Labs  08/16/14 0254  WBC 10.3  NEUTROABS 5.4  HGB 13.2  HCT 39.8  MCV 83.8  PLT 306    Recent Labs  08/16/14 0254  TROPONINI 0.03   No results found for: INR, PROTIME   LAB RESULT POCT:  Results for orders placed or performed during the hospital encounter of 08/16/14  Comprehensive metabolic panel  Result Value Ref Range   Sodium 135 135 - 145 mmol/L   Potassium 3.2 (L) 3.5 - 5.1 mmol/L   Chloride 104 96 - 112 mmol/L   CO2 20 19 - 32 mmol/L   Glucose, Bld 297 (H) 70 - 99 mg/dL   BUN 14 6 - 23 mg/dL   Creatinine, Ser 4.09 0.50 - 1.10 mg/dL   Calcium 8.3 (L) 8.4 - 10.5 mg/dL   Total Protein 7.1 6.0 - 8.3 g/dL   Albumin 3.5 3.5 - 5.2 g/dL   AST 24 0 - 37 U/L   ALT 15 0 - 35 U/L   Alkaline Phosphatase 98 39 - 117 U/L   Total Bilirubin 1.0 0.3 - 1.2 mg/dL   GFR calc non Af Amer 67 (L) >90 mL/min   GFR calc Af Amer 78 (L) >90 mL/min   Anion gap 11 5 - 15  Troponin I  Result Value Ref Range   Troponin I 0.03 <0.031 ng/mL  Brain natriuretic peptide  Result Value Ref Range   B Natriuretic Peptide 158.2 (H) 0.0 - 100.0 pg/mL  CBC with Differential  Result Value Ref Range   WBC 10.3 4.0 - 10.5 K/uL   RBC 4.75  3.87 - 5.11 MIL/uL   Hemoglobin 13.2 12.0 - 15.0 g/dL   HCT 81.1 91.4 - 78.2 %   MCV 83.8 78.0 - 100.0 fL   MCH 27.8 26.0 - 34.0 pg   MCHC 33.2 30.0 - 36.0 g/dL  RDW 14.9 11.5 - 15.5 %   Platelets 306 150 - 400 K/uL   Neutrophils Relative % 53 43 - 77 %   Neutro Abs 5.4 1.7 - 7.7 K/uL   Lymphocytes Relative 41 12 - 46 %   Lymphs Abs 4.2 (H) 0.7 - 4.0 K/uL   Monocytes Relative 4 3 - 12 %   Monocytes Absolute 0.5 0.1 - 1.0 K/uL   Eosinophils Relative 2 0 - 5 %   Eosinophils Absolute 0.2 0.0 - 0.7 K/uL   Basophils Relative 0 0 - 1 %   Basophils Absolute 0.0 0.0 - 0.1 K/uL  I-Stat CG4 Lactic Acid, ED  Result Value Ref Range   Lactic Acid, Venous 2.28 (HH) 0.5 - 2.0 mmol/L   Comment NOTIFIED PHYSICIAN   I-Stat arterial blood gas, ED  Result Value Ref Range   pH, Arterial 7.335 (L) 7.350 - 7.450   pCO2 arterial 40.4 35.0 - 45.0 mmHg   pO2, Arterial 66.0 (L) 80.0 - 100.0 mmHg   Bicarbonate 21.5 20.0 - 24.0 mEq/L   TCO2 23 0 - 100 mmol/L   O2 Saturation 91.0 %   Acid-base deficit 4.0 (H) 0.0 - 2.0 mmol/L   Patient temperature 98.6 F    Collection site RADIAL, ALLEN'S TEST ACCEPTABLE    Drawn by RT    Sample type ARTERIAL       Radiological Exams on Admission: Dg Chest Port 1 View  08/16/2014   CLINICAL DATA:  Respiratory distress.  EXAM: PORTABLE CHEST - 1 VIEW  COMPARISON:  08/22/2004  FINDINGS: Heart is at the upper limits of normal. There is pulmonary edema and vascular congestion. Haziness at the lung bases likely pleural effusions. There is no pneumothorax. No acute osseous abnormalities are seen.  IMPRESSION: Vascular congestion and pulmonary edema. Likely pleural effusions. Findings suggest CHF.   Electronically Signed   By: Rubye Oaks M.D.   On: 08/16/2014 03:40      Orders placed or performed during the hospital encounter of 08/16/14  . ED EKG  . ED EKG     Assessment/Plan Active Problems:   Flash pulmonary edema   CHF (congestive heart failure)    Hypertensive emergency   HTN (hypertension)   DM (diabetes mellitus), type 2, uncontrolled   At risk for sleep apnea   HTN Emergancy leading to Flash Pulm Edema and Hypoxic resp Failure improving post BiPAP and NTG gtt. Admit to step down. Rule ourt MI c serial Enzymes. Wean NTG as able.  Titrate O2 to Nasal Canula as able. BNP was not high but clinical story c/w CHF Diurese. Repeat CXR already in ordered. Will need ECHO. May have Pulm HTN Will need Outpt OSA eval. May need LE Dopplers/CTPA to R/out chronic PEs if above eval indicates. Tighten BP control. Dr Zara Chess last note had her on HCT 25, Irbersartan 300, Lebatolol 200 BID. Doubt Pheo but get Serum Catecholamines.  Expect high d/t Stress.  But if low, it will be (-) W/up.  If + will need repeat when stable. TSH and Lipid eval ordered.  DM2 - CBG poor.  Check A1C.  She is supposed to be on Janumet.  Will start Insulin.  Hold Metformin c slight increased Lactic Acid.  Lipids - Atorvastatin  HTN - Will try and Manage c Avapro, increased dose of Lebatolol, and change HCT to Lasix.  Obesity - not morbid.  Needs weight loss.  Continue ASA.  DVT Proph.  Needs better outpt compliance with follow ups.  Delana Manganello M 08/16/2014, 5:57 AM

## 2014-08-16 NOTE — ED Notes (Signed)
Pt asleep, O2 sat dropped to 88%, pt stimulated, O2 sats increased

## 2014-08-16 NOTE — ED Notes (Signed)
Attempted report 

## 2014-08-16 NOTE — ED Notes (Signed)
Family at bedside. 

## 2014-08-16 NOTE — ED Notes (Signed)
Patients family came out of room to advise that the patient felt that she was going to vomit. RT removed patient from Bipap and applied a non-rebreather.  Patient O2 saturation is 88-90 on NRB. RT made RN and MD. RT will continue to monitor.

## 2014-08-16 NOTE — ED Provider Notes (Signed)
CSN: [id number]     Arrival date & time 08/16/14  0241 History  This chart was scribed for Audree Camel, MD by Roxy Cedar, ED Scribe. This patient was seen in room [facility] and the patient's care was started at 2:42 AM.   Chief Complaint  Patient presents with  . Respiratory Distress   Patient is a 54 y.o. female presenting with cough and shortness of breath. The history is provided by the patient and the EMS personnel. No language interpreter was used.  Cough Associated symptoms: shortness of breath and wheezing   Associated symptoms: no chest pain and no fever   Shortness of Breath Severity:  Moderate Onset quality:  Sudden Progression:  Partially resolved Chronicity:  New Worsened by:  Nothing tried Ineffective treatments:  None tried Associated symptoms: cough and wheezing   Associated symptoms: no chest pain and no fever    HPI Comments: EBUNOLUWA GERNERT is a 54 y.o. female with a PMHx of diabetes and hypertension, brought in by ambulance, who presents to the Emergency Department complaining of respiratory distress that began prior to arrival. Patient reports associated shortness of breath, wheezing and productive cough with clear sputum that initially began yesterday and suddenly worsened prior to arrival at 12:30 AM. She reports associated mild edema of legs bilaterally onset yesterday. She denies associated fever, chills, or chest pain. She denies PMHx of COPD, bhronchitis, emphysema. She denies prior history of heart problems. Patient states she last took her BP medication less than 24 hours ago.   Past Medical History  Diagnosis Date  . Hypertension    History reviewed. No pertinent past surgical history. Family History  Problem Relation Age of Onset  . Cancer Mother    History  Substance Use Topics  . Smoking status: Never Smoker   . Smokeless tobacco: Not on file  . Alcohol Use: No   OB History    No data available     Review of Systems   Constitutional: Negative for fever.  Respiratory: Positive for cough, shortness of breath and wheezing.   Cardiovascular: Positive for leg swelling. Negative for chest pain.  All other systems reviewed and are negative.  Allergies  Review of patient's allergies indicates no known allergies.  Home Medications   Prior to Admission medications   Medication Sig Start Date End Date Taking? Authorizing Provider  hydrochlorothiazide (HYDRODIURIL) 25 MG tablet Take 25 mg by mouth daily.    Historical Provider, MD  labetalol (NORMODYNE) 200 MG tablet Take 200 mg by mouth 2 (two) times daily.    Historical Provider, MD  lisinopril (PRINIVIL,ZESTRIL) 10 MG tablet Take 10 mg by mouth daily.    Historical Provider, MD   Triage Vitals: BP 187/122 mmHg  Pulse 110  Resp 126  Ht  (1.575 m)  Wt 200 lb (90.719 kg)  BMI 36.57 kg/m2  SpO2 92%  LMP  (LMP Unknown)  Physical Exam  Constitutional: She is oriented to person, place, and time. She appears well-developed and well-nourished. She appears distressed.  HENT:  Head: Normocephalic and atraumatic.  Eyes: Right eye exhibits no discharge. Left eye exhibits no discharge.  Neck: Neck supple. No tracheal deviation present.  Cardiovascular: Regular rhythm and normal heart sounds.  Tachycardia present.   Pulmonary/Chest: Effort normal. No respiratory distress. She has wheezes.  Diffuse crackles and expiratory wheezes. Increased work in breathing.  Musculoskeletal: She exhibits no edema or tenderness.  Neurological: She is alert and oriented to person, place, and time.  Skin:  Skin is warm and dry. She is not diaphoretic.  Psychiatric: She has a normal mood and affect. Her behavior is normal.  Nursing note and vitals reviewed.  ED Course  Procedures (including critical care time)  DIAGNOSTIC STUDIES: Oxygen Saturation is 92% on [place], low by my interpretation.    COORDINATION OF CARE: 2:46 AM- Discussed plans to order diagnostic imaging of  chest, EKG and lab work. Will give patient nitroglycerin . Pt advised of plan for treatment and pt agrees.  Labs Review Labs Reviewed  COMPREHENSIVE METABOLIC PANEL - Abnormal; Notable for the following:    Potassium 3.2 (*)    Glucose, Bld 297 (*)    Calcium 8.3 (*)    GFR calc non Af Amer 67 (*)    GFR calc Af Amer 78 (*)    All other components within normal limits  BRAIN NATRIURETIC PEPTIDE - Abnormal; Notable for the following:    B Natriuretic Peptide 158.2 (*)    All other components within normal limits  CBC WITH DIFFERENTIAL/PLATELET - Abnormal; Notable for the following:    Lymphs Abs 4.2 (*)    All other components within normal limits  I-STAT CG4 LACTIC ACID, ED - Abnormal; Notable for the following:    Lactic Acid, Venous 2.28 (*)    All other components within normal limits  I-STAT ARTERIAL BLOOD GAS, ED - Abnormal; Notable for the following:    pH, Arterial 7.335 (*)    pO2, Arterial 66.0 (*)    Acid-base deficit 4.0 (*)    All other components within normal limits  TROPONIN I   Imaging Review Dg Chest Port 1 View  08/16/2014   CLINICAL DATA:  Respiratory distress.  EXAM: PORTABLE CHEST - 1 VIEW  COMPARISON:  08/22/2004  FINDINGS: Heart is at the upper limits of normal. There is pulmonary edema and vascular congestion. Haziness at the lung bases likely pleural effusions. There is no pneumothorax. No acute osseous abnormalities are seen.  IMPRESSION: Vascular congestion and pulmonary edema. Likely pleural effusions. Findings suggest CHF.   Electronically Signed   By: Rubye Oaks M.D.   On: 08/16/2014 03:40     Date: 08/16/2014  Rate: 111  Rhythm: sinus tachycardia  QRS Axis: normal  Intervals: QT prolonged  ST/T Wave abnormalities: nonspecific T wave changes  Conduction Disutrbances:none  Narrative Interpretation:   Old EKG Reviewed: changes noted   CRITICAL CARE Performed by: Pricilla Loveless T   Total critical care time: 60 minutes  Critical care  time was exclusive of separately billable procedures and treating other patients.  Critical care was necessary to treat or prevent imminent or life-threatening deterioration.  Critical care was time spent personally by me on the following activities: development of treatment plan with patient and/or surrogate as well as nursing, discussions with consultants, evaluation of patient's response to treatment, examination of patient, obtaining history from patient or surrogate, ordering and performing treatments and interventions, ordering and review of laboratory studies, ordering and review of radiographic studies, pulse oximetry and re-evaluation of patient's condition.  MDM   Final diagnoses:  Acute pulmonary edema  Hypoxia    Patient with acute pulmonary edema, likely secondary to hypertensive emergency. No prior history of CHF but does have poorly controlled hypertension. Patient is quite hypoxic on room air, low 80s, high 70s. Patient is currently requiring BiPAP to maintain the saturations. She was started on IV nitroglycerin drip to help reduce her blood pressure. Her work of breathing did improve with blood pressure  control. She was still not able to be taken off of BiPAP due to hypoxia. She started to feel nauseated and vomited shortly after BiPAP was removed quickly. She is in place on nonrebreather and given Zofran. Due to the vomiting, I feel BiPAP is contraindicated at this time. She is able to talk to me in full sentences but is still hypoxic. Discussed with critical care and cardiology, they have referred patient to be admitted by medicine stepdown unit. Dr. Timothy Lasso has evaluated patient and will admit. While in the ED she has progressively become less short of breath and her O2 sats are improving. I do not feel she needs intubation at this time. BNP is less than 500 and thus this seems unlikely to be CHF. No evidence of MI at this time. I do not feel Lasix would be beneficial she does not  appear volume overloaded.  I personally performed the services described in this documentation, which was scribed in my presence. The recorded information has been reviewed and is accurate.   Audree Camel, MD 08/16/14 [phone]

## 2014-08-16 NOTE — ED Notes (Signed)
Lunch tray ordered 

## 2014-08-16 NOTE — ED Notes (Signed)
Breakfast tray ordered 

## 2014-08-16 NOTE — ED Notes (Signed)
Called pharmacy requesting Lantus be sent to ED

## 2014-08-16 NOTE — Progress Notes (Signed)
08/16/2014 11:11 AM Janann Boeve, Justice Deeds, RN, BSN UR completed

## 2014-08-16 NOTE — Consult Note (Signed)
CARDIOLOGY CONSULT NOTE  Patient ID: Misty Miller  MRN: [id number]  DOB: 03/22/1961  Admit date: 08/16/2014 Date of Consult: 08/16/2014  Primary Physician: Default, Provider, MD  Reason for Consultation: New pulmonary edema  History of Present Illness: Misty Miller is a 54 y.o. female with history of hypertension requiring 3 drugs for control.  She was in her usual state of health until yesterday when she became short of breath while walking at work.  This resolved on its own after resting. Then tonight she had difficulty sleeping, and had cough and respiratory distress.  See H&P by Dr. Timothy Lasso for full details.  She was hypertensive to 178/100 on arrival after 2 SL NTG by EMS. Bipap initially required but now on NRB mask.  CXR with pulmonary edema and pleural effusions.  No chest pain.  Past Medical History  Diagnosis Date  . Hypertension     History reviewed. No pertinent past surgical history.   Home Meds: Prior to Admission medications   Medication Sig Start Date End Date Taking? Authorizing Provider  amoxicillin (AMOXIL) 500 MG capsule Take 500-1,000 mg by mouth every 6 (six) hours. Take 1000mg  on day one and 500mg  every 6 hours until gone   Yes Historical Provider, MD  hydrochlorothiazide (HYDRODIURIL) 25 MG tablet Take 25 mg by mouth daily.   Yes Historical Provider, MD  labetalol (NORMODYNE) 200 MG tablet Take 200 mg by mouth 2 (two) times daily.   Yes Historical Provider, MD  lisinopril (PRINIVIL,ZESTRIL) 10 MG tablet Take 10 mg by mouth daily.    Historical Provider, MD    Current Medications: . albuterol  5 mg Nebulization Once  . furosemide  40 mg Intravenous Once     Allergies:   No Known Allergies  Social History:   The patient  reports that she has never smoked. She does not have any smokeless tobacco history on file. She reports that she does not drink alcohol or use illicit drugs.    Family History:   The patient's family history includes Cancer in her  mother.   ROS:  Please see the history of present illness.  All other systems reviewed and negative.   Vital Signs: Blood pressure 126/81, pulse 92, temperature 97.2 F (36.2 C), temperature source Oral, resp. rate 12, height 5\' 2"  (1.575 m), weight 90.719 kg (200 lb), SpO2 95 %.   PHYSICAL EXAM: General:  Well nourished, well developed, mildly short of breath on NRB. HEENT: normal Lymph: no adenopathy Neck: no JVD Endocrine:  No thryomegaly Vascular: No carotid bruits; FA pulses 2+ bilaterally without bruits Cardiac:  normal S1, S2; RRR; no murmur Lungs:  clear to auscultation bilaterally, no wheezing, rhonchi or rales Abd: soft, nontender, no hepatomegaly Ext: no edema Musculoskeletal:  No deformities, BUE and BLE strength normal and equal Skin: warm and dry Neuro:  CNs 2-12 intact, no focal abnormalities noted Psych:  Normal affect   EKG:  pending Labs:  Recent Labs  08/16/14 0254  TROPONINI 0.03   Lab Results  Component Value Date   WBC 10.3 08/16/2014   HGB 13.2 08/16/2014   HCT 39.8 08/16/2014   MCV 83.8 08/16/2014   PLT 306 08/16/2014    Recent Labs Lab 08/16/14 0254  NA 135  K 3.2*  CL 104  CO2 20  BUN 14  CREATININE 0.95  CALCIUM 8.3*  PROT 7.1  BILITOT 1.0  ALKPHOS 98  ALT 15  AST 24  GLUCOSE 297*   No results  found for: CHOL, HDL, LDLCALC, TRIG No results found for: DDIMER  Radiology/Studies:  Dg Chest Port 1 View  08/16/2014   CLINICAL DATA:  Respiratory distress.  EXAM: PORTABLE CHEST - 1 VIEW  COMPARISON:  08/22/2004  FINDINGS: Heart is at the upper limits of normal. There is pulmonary edema and vascular congestion. Haziness at the lung bases likely pleural effusions. There is no pneumothorax. No acute osseous abnormalities are seen.  IMPRESSION: Vascular congestion and pulmonary edema. Likely pleural effusions. Findings suggest CHF.   Electronically Signed   By: Rubye Oaks M.D.   On: 08/16/2014 03:40    ASSESSMENT AND  PLAN:  1. Flash pulmonary edema - Cardiac exam is benign - possibly caused by hypertensive emergency. - Agree with NTG gtt. - Agree with echo, will leave additional recs after echo is performed.    Kelly Splinter 08/16/2014 6:02 AM

## 2014-08-16 NOTE — ED Notes (Signed)
Per Timothy Lasso, MD nitro infusion can be discontinued

## 2014-08-16 NOTE — ED Notes (Signed)
Pt arrives from home c/o a sudden onset of SOB about an hour ago. Upon EMS arrival pt had rhonchi/rales in all lung fields. EMS gave Nitro x 2. Albuterol 5mg . Pt also has a cough. 94% on Bi-Pap.  178/110 CBG 240. 20G LFA

## 2014-08-16 NOTE — ED Notes (Signed)
Patient is resting comfortably. 

## 2014-08-17 ENCOUNTER — Inpatient Hospital Stay (HOSPITAL_COMMUNITY): Payer: Managed Care, Other (non HMO)

## 2014-08-17 DIAGNOSIS — I509 Heart failure, unspecified: Secondary | ICD-10-CM

## 2014-08-17 DIAGNOSIS — N179 Acute kidney failure, unspecified: Secondary | ICD-10-CM

## 2014-08-17 DIAGNOSIS — I10 Essential (primary) hypertension: Secondary | ICD-10-CM

## 2014-08-17 DIAGNOSIS — I5031 Acute diastolic (congestive) heart failure: Secondary | ICD-10-CM

## 2014-08-17 LAB — BASIC METABOLIC PANEL
Anion gap: 10 (ref 5–15)
BUN: 34 mg/dL — ABNORMAL HIGH (ref 6–23)
CO2: 27 mmol/L (ref 19–32)
Calcium: 9.2 mg/dL (ref 8.4–10.5)
Chloride: 102 mmol/L (ref 96–112)
Creatinine, Ser: 2.69 mg/dL — ABNORMAL HIGH (ref 0.50–1.10)
GFR calc Af Amer: 22 mL/min — ABNORMAL LOW (ref >90)
GFR calc non Af Amer: 19 mL/min — ABNORMAL LOW (ref >90)
Glucose, Bld: 138 mg/dL — ABNORMAL HIGH (ref 70–99)
Potassium: 3.7 mmol/L (ref 3.5–5.1)
Sodium: 139 mmol/L (ref 135–145)

## 2014-08-17 LAB — TROPONIN I: Troponin I: 0.35 ng/mL — ABNORMAL HIGH (ref <0.031)

## 2014-08-17 LAB — COMPREHENSIVE METABOLIC PANEL
ALT: 12 U/L (ref 0–35)
AST: 21 U/L (ref 0–37)
Albumin: 3.1 g/dL — ABNORMAL LOW (ref 3.5–5.2)
Alkaline Phosphatase: 76 U/L (ref 39–117)
Anion gap: 8 (ref 5–15)
BUN: 32 mg/dL — ABNORMAL HIGH (ref 6–23)
CO2: 27 mmol/L (ref 19–32)
Calcium: 8.7 mg/dL (ref 8.4–10.5)
Chloride: 103 mmol/L (ref 96–112)
Creatinine, Ser: 2.71 mg/dL — ABNORMAL HIGH (ref 0.50–1.10)
GFR calc Af Amer: 22 mL/min — ABNORMAL LOW (ref >90)
GFR calc non Af Amer: 19 mL/min — ABNORMAL LOW (ref >90)
Glucose, Bld: 151 mg/dL — ABNORMAL HIGH (ref 70–99)
Potassium: 3.4 mmol/L — ABNORMAL LOW (ref 3.5–5.1)
Sodium: 138 mmol/L (ref 135–145)
Total Bilirubin: 1 mg/dL (ref 0.3–1.2)
Total Protein: 5.9 g/dL — ABNORMAL LOW (ref 6.0–8.3)

## 2014-08-17 LAB — CBC
HCT: 31.3 % — ABNORMAL LOW (ref 36.0–46.0)
Hemoglobin: 10.2 g/dL — ABNORMAL LOW (ref 12.0–15.0)
MCH: 27.5 pg (ref 26.0–34.0)
MCHC: 32.6 g/dL (ref 30.0–36.0)
MCV: 84.4 fL (ref 78.0–100.0)
Platelets: 214 10*3/uL (ref 150–400)
RBC: 3.71 MIL/uL — ABNORMAL LOW (ref 3.87–5.11)
RDW: 15.4 % (ref 11.5–15.5)
WBC: 10.6 10*3/uL — ABNORMAL HIGH (ref 4.0–10.5)

## 2014-08-17 LAB — GLUCOSE, CAPILLARY
Glucose-Capillary: 148 mg/dL — ABNORMAL HIGH (ref 70–99)
Glucose-Capillary: 147 mg/dL — ABNORMAL HIGH (ref 70–99)
Glucose-Capillary: 135 mg/dL — ABNORMAL HIGH (ref 70–99)
Glucose-Capillary: 136 mg/dL — ABNORMAL HIGH (ref 70–99)

## 2014-08-17 LAB — BRAIN NATRIURETIC PEPTIDE: B Natriuretic Peptide: 185.1 pg/mL — ABNORMAL HIGH (ref 0.0–100.0)

## 2014-08-17 LAB — HEPARIN LEVEL (UNFRACTIONATED): Heparin Unfractionated: 0.48 IU/mL (ref 0.30–0.70)

## 2014-08-17 IMAGING — CR DG CHEST 1V PORT
1 series · 1 of 1 positions shown · non-contrast
Comparison: [DATE]; [DATE]

CLINICAL DATA: CHF.  Shortness of breath and cough.

EXAM:
PORTABLE CHEST - 1 VIEW

[AP]
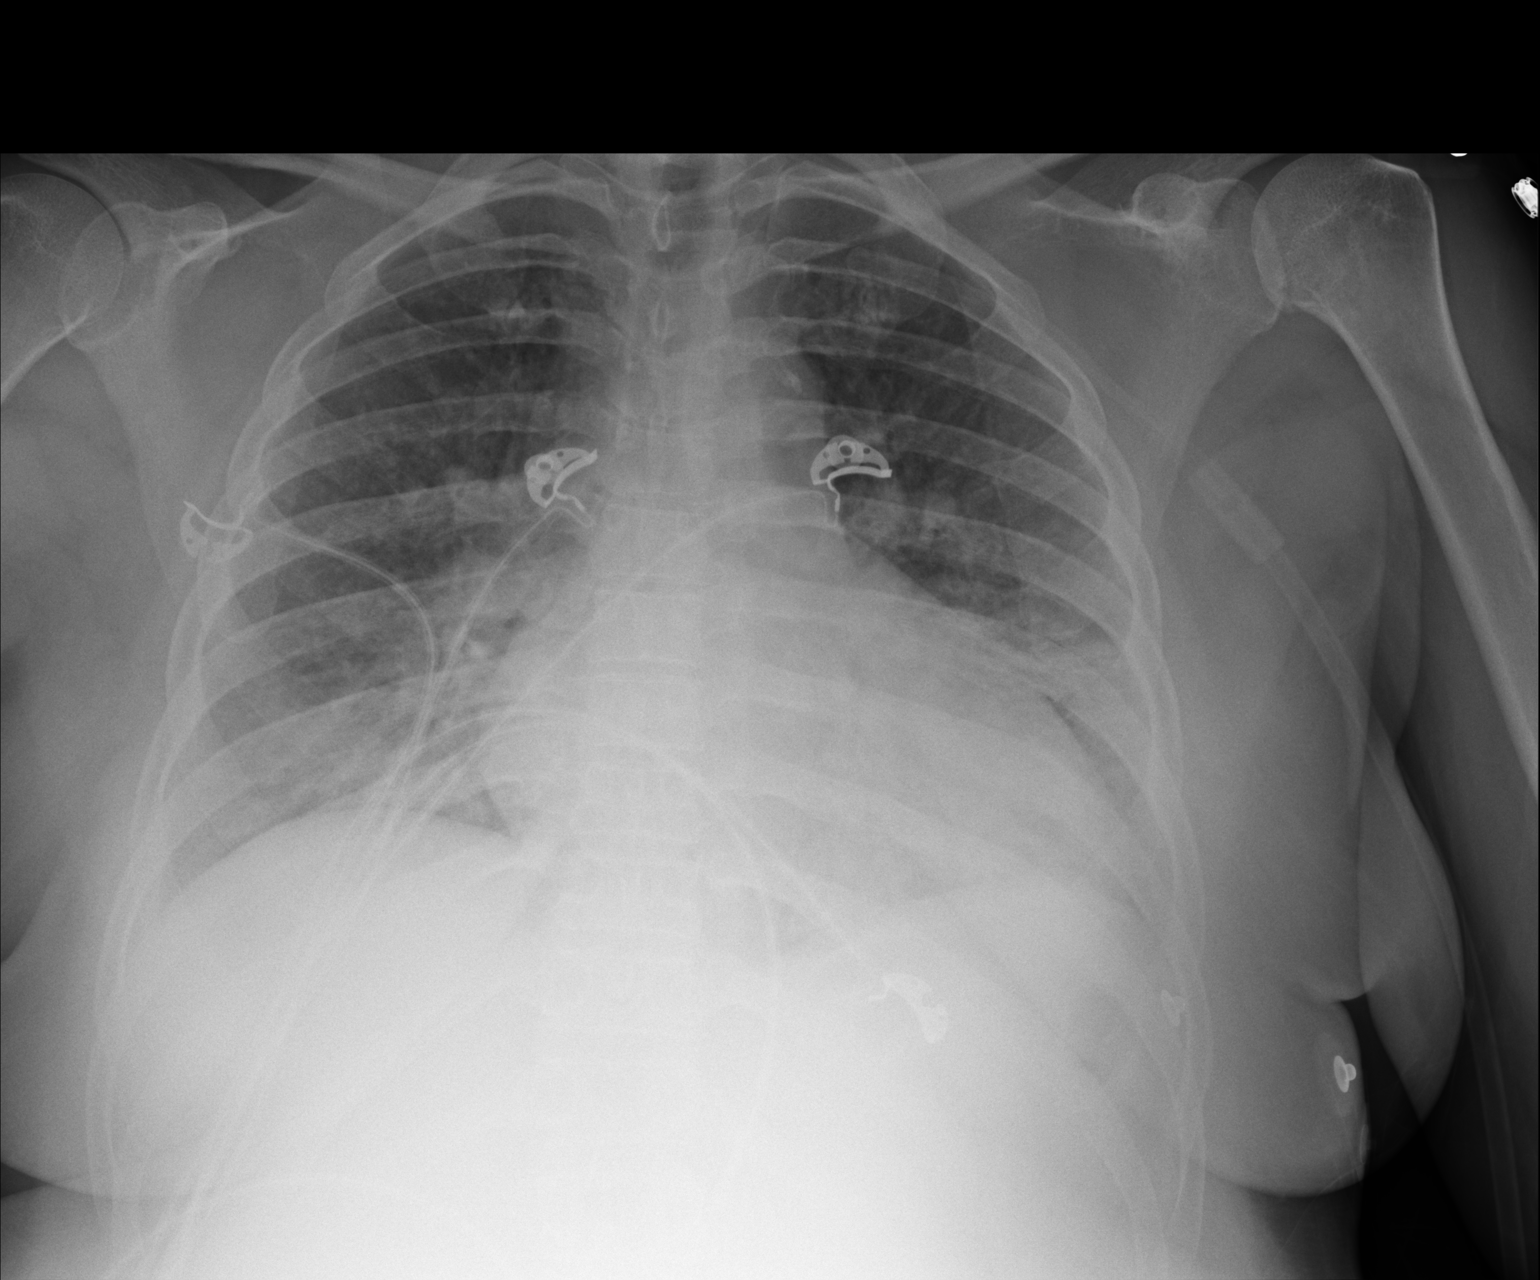

[1 of 1 positions shown; findings below may reference images not displayed]

FINDINGS: Grossly unchanged enlarged cardiac silhouette. Overall improved
aeration lungs with persistent perihilar heterogeneous opacities.
Mild residual pulmonary venous congestion without frank evidence of
edema. No new focal airspace opacities. Trace bilateral effusions
are not excluded. No pneumothorax. Unchanged bones.
IMPRESSION: 1. Improved aeration of the lungs suggests resolving edema and
atelectasis.
2. Residual pulmonary venous congestion and bilateral infrahilar
atelectasis. No new focal airspace opacities.

## 2014-08-17 MED ORDER — AMLODIPINE BESYLATE 5 MG PO TABS
5.0000 mg | ORAL_TABLET | Freq: Every day | ORAL | Status: DC
  Administered 2014-08-18 – 2014-08-20 (×3): 5 mg via ORAL
  Filled 2014-08-17 (×3): qty 1

## 2014-08-17 MED ORDER — HEPARIN BOLUS VIA INFUSION
4000.0000 [IU] | Freq: Once | INTRAVENOUS | Status: AC
  Administered 2014-08-17: 4000 [IU] via INTRAVENOUS
  Filled 2014-08-17: qty 4000

## 2014-08-17 MED ORDER — INSULIN GLARGINE 100 UNIT/ML ~~LOC~~ SOLN
12.0000 [IU] | Freq: Every day | SUBCUTANEOUS | Status: DC
  Administered 2014-08-18 – 2014-08-20 (×3): 12 [IU] via SUBCUTANEOUS
  Filled 2014-08-17 (×4): qty 0.12

## 2014-08-17 MED ORDER — TECHNETIUM TC 99M DIETHYLENETRIAME-PENTAACETIC ACID: 40.0000 | Freq: Once | INTRAVENOUS | Status: AC | PRN

## 2014-08-17 MED ORDER — TECHNETIUM TO 99M ALBUMIN AGGREGATED
6.0000 | Freq: Once | INTRAVENOUS | Status: AC | PRN
  Administered 2014-08-17: 6 via INTRAVENOUS

## 2014-08-17 MED ORDER — ENOXAPARIN SODIUM 30 MG/0.3ML ~~LOC~~ SOLN: 30.0000 mg | Freq: Every day | SUBCUTANEOUS | Status: DC

## 2014-08-17 MED ORDER — HEPARIN (PORCINE) IN NACL 100-0.45 UNIT/ML-% IJ SOLN
1000.0000 [IU]/h | INTRAMUSCULAR | Status: DC
  Administered 2014-08-17: 850 [IU]/h via INTRAVENOUS
  Administered 2014-08-18 – 2014-08-19 (×2): 1000 [IU]/h via INTRAVENOUS
  Filled 2014-08-17 (×5): qty 250

## 2014-08-17 NOTE — Progress Notes (Addendum)
ANTICOAGULATION CONSULT NOTE - Initial Consult  Pharmacy Consult for Heparin Indication: chest pain/ACS  No Known Allergies  Patient Measurements: Height: 5\' 2"  (157.5 cm) Weight: 207 lb 10.8 oz (94.2 kg) IBW/kg (Calculated) : 50.1 Heparin Dosing Weight: 72 kg  Vital Signs: Temp: 98.2 F (36.8 C) (02/07 0300) Temp Source: Oral (02/07 0300) BP: 133/68 mmHg (02/07 0600) Pulse Rate: 81 (02/07 0556)  Labs:  Recent Labs  08/16/14 0254 08/16/14 1645 08/16/14 1910 08/17/14 0324  HGB 13.2  --   --  10.2*  HCT 39.8  --   --  31.3*  PLT 306  --   --  214  CREATININE 0.95  --   --  2.71*  TROPONINI 0.03 0.21* 0.29* 0.35*    Estimated Creatinine Clearance: 25.7 mL/min (by C-G formula based on Cr of 2.71).   Medical History: Past Medical History  Diagnosis Date  . Hypertension     Assessment: 53yo female admitted on 2/6 for flash pulm edema/CHF d/t hypertensive emergency.  Determined to have small demand MI; CP and SOB resolved this AM.  Previously on dose adjusted lovenox for DVT ppx, pharmacy consulted to dose heparin for demand MI until PE ruled out.  Elevated trop x3 and D-dimer, to get V/Q scan, cannot r/o true ACS per Dr. Alford Highland note.  Hgb slightly low at 10.2, PLT WNl, no bleeding noted.  Goal of Therapy:  Heparin level 0.3-0.7 units/ml Monitor platelets by anticoagulation protocol: Yes   Plan:  Heparin bolus 4000 units IV Heparin gtt at 850 units/hr IV Check 8hr heparin level at 1830 Monitor daily HL, CBC, s/sx of bleeding  Waynette Buttery, PharmD Clinical Pharmacy Resident Pager: [phone] 08/17/2014 10:21 AM   Addum:  Heparin level therapeutic.  Cont same and f/u am labs Talbert Cage, PharmD

## 2014-08-17 NOTE — Progress Notes (Addendum)
Patient ID: Misty Miller, female   DOB: 1960/09/24, 54 y.o.   MRN: [id number]   SUBJECTIVE: No complaints today, breathing better.  Oxygen saturation 95% on room air.  No chest pain.  BP is under better control.  Of note, creatinine up from 0.9 to 2.7.  She got 1 dose Lasix 40 mg IV yesterday and irbesartan yesterday and today.    Scheduled Meds: . [START ON 08/18/2014] amLODipine  5 mg Oral Daily  . aspirin EC  81 mg Oral Daily  . atorvastatin  20 mg Oral q1800  . insulin aspart  0-15 Units Subcutaneous TID WC  . insulin glargine  10 Units Subcutaneous Daily  . labetalol  200 mg Oral 3 times per day  . linagliptin  5 mg Oral Daily  . sodium chloride  3 mL Intravenous Q12H   Continuous Infusions: . sodium chloride 10 mL/hr at 08/16/14 0700   PRN Meds:.acetaminophen **OR** acetaminophen, ondansetron **OR** ondansetron (ZOFRAN) IV    Filed Vitals:   08/17/14 0300 08/17/14 0556 08/17/14 0600 08/17/14 0830  BP: 124/67 133/68 133/68   Pulse: 76 81    Temp: 98.2 F (36.8 C)     TempSrc: Oral     Resp: 18     Height:      Weight: 207 lb 10.8 oz (94.2 kg)     SpO2: 97%   93%    Intake/Output Summary (Last 24 hours) at 08/17/14 0945 Last data filed at 08/16/14 2200  Gross per 24 hour  Intake    120 ml  Output      0 ml  Net    120 ml    LABS: Basic Metabolic Panel:  Recent Labs  47/84/12 0254 08/17/14 0324  NA 135 138  K 3.2* 3.4*  CL 104 103  CO2 20 27  GLUCOSE 297* 151*  BUN 14 32*  CREATININE 0.95 2.71*  CALCIUM 8.3* 8.7   Liver Function Tests:  Recent Labs  08/16/14 0254 08/17/14 0324  AST 24 21  ALT 15 12  ALKPHOS 98 76  BILITOT 1.0 1.0  PROT 7.1 5.9*  ALBUMIN 3.5 3.1*   No results for input(s): LIPASE, AMYLASE in the last 72 hours. CBC:  Recent Labs  08/16/14 0254 08/17/14 0324  WBC 10.3 10.6*  NEUTROABS 5.4  --   HGB 13.2 10.2*  HCT 39.8 31.3*  MCV 83.8 84.4  PLT 306 214   Cardiac Enzymes:  Recent Labs  08/16/14 1645  08/16/14 1910 08/17/14 0324  TROPONINI 0.21* 0.29* 0.35*   BNP: Invalid input(s): POCBNP D-Dimer:  Recent Labs  08/16/14 1645  DDIMER 2.99*   Hemoglobin A1C: No results for input(s): HGBA1C in the last 72 hours. Fasting Lipid Panel:  Recent Labs  08/16/14 1645  CHOL 224*  HDL 51  LDLCALC 149*  TRIG 122  CHOLHDL 4.4   Thyroid Function Tests:  Recent Labs  08/16/14 1645  TSH 1.122   Anemia Panel: No results for input(s): VITAMINB12, FOLATE, FERRITIN, TIBC, IRON, RETICCTPCT in the last 72 hours.  RADIOLOGY: Dg Chest Port 1 View  08/17/2014   CLINICAL DATA:  CHF.  Shortness of breath and cough.  EXAM: PORTABLE CHEST - 1 VIEW  COMPARISON:  08/16/2014; 08/22/2004  FINDINGS: Grossly unchanged enlarged cardiac silhouette. Overall improved aeration lungs with persistent perihilar heterogeneous opacities. Mild residual pulmonary venous congestion without frank evidence of edema. No new focal airspace opacities. Trace bilateral effusions are not excluded. No pneumothorax. Unchanged bones.  IMPRESSION: 1. Improved  aeration of the lungs suggests resolving edema and atelectasis. 2. Residual pulmonary venous congestion and bilateral infrahilar atelectasis. No new focal airspace opacities.   Electronically Signed   By: Simonne Come M.D.   On: 08/17/2014 08:37   Dg Chest Port 1 View  08/16/2014   CLINICAL DATA:  Severe shortness of breath.  Respiratory distress.  EXAM: PORTABLE CHEST - 1 VIEW  COMPARISON:  Earlier the same day at 0259 hr.  FINDINGS: Heart remains at the upper limits of normal. Grossly unchanged pulmonary edema and vascular congestion. Worsening hazy opacity at the left lung base, unchanged hazy opacity at the right lung base. No pneumothorax. Osseous structures are unchanged.  IMPRESSION: Worsening hazy opacity at the left lung base concerning for increase pleural effusion. Unchanged pulmonary edema, vascular congestion, and right pleural effusion.   Electronically Signed   By:  Rubye Oaks M.D.   On: 08/16/2014 06:54   Dg Chest Port 1 View  08/16/2014   CLINICAL DATA:  Respiratory distress.  EXAM: PORTABLE CHEST - 1 VIEW  COMPARISON:  08/22/2004  FINDINGS: Heart is at the upper limits of normal. There is pulmonary edema and vascular congestion. Haziness at the lung bases likely pleural effusions. There is no pneumothorax. No acute osseous abnormalities are seen.  IMPRESSION: Vascular congestion and pulmonary edema. Likely pleural effusions. Findings suggest CHF.   Electronically Signed   By: Rubye Oaks M.D.   On: 08/16/2014 03:40    PHYSICAL EXAM General: NAD Neck: JVP 8 cm, no thyromegaly or thyroid nodule.  Lungs: Clear to auscultation bilaterally with normal respiratory effort. CV: Nondisplaced PMI.  Heart regular S1/S2, +S4, no murmur.  No peripheral edema.   Abdomen: Soft, nontender, no hepatosplenomegaly, no distention.  Neurologic: Alert and oriented x 3.  Psych: Normal affect. Extremities: No clubbing or cyanosis.   TELEMETRY: Reviewed telemetry pt in NSR  ASSESSMENT AND PLAN: 54 yo with history of HTN and diabetes presented to [facility] with acute severe dyspnea.  She went to the ER with hypertensive emegency and flash pulmonary edema.  1. Flash pulmonary edema: Fairly sudden onset.  She says she has been taking most of her meds but had been off irbesartan for months.  She took some BP meds yesterday but does not remember which.  No chest pain, just dyspnea.  It is possible that the flash pulmonary edema could be due to uncontrolled HTN but I am also concerned about the possibility of ischemia (ischemic diastolic dysfunction with flash pulmonary edema).   - BP control is better today, continue meds (no irbesartan with AKI).  - Volume looks much better controlled now that BP is down.  JVP 8, she is off oxygen.  Given rise in creatinine, no Lasix today.  - Renal artery dopplers to look for bilateral renal artery stenosis.  - Should have ischemic workup in  some form.  With rise in creatinine, no cath for now.  May do functional study eventually.  - With sudden respiratory distress and very elevated D dimer, to get V/Q scan.  - echo today 2. HTN: BP under better control.  Continue labetalol.  Stop irbesartan.  Add amlodipine to start tomorrow.  3. AKI: Very marked rise in creatinine over one day.  She got 1 dose IV Lasix and 1 dose of irbesartan prior to having BMET drawn with creatinine 2.7.  She had not had irbesartan for about a year.  ?Hemodynamically-mediated rise in creatinine versus ARB-induced AKI.  She did not get contrast.  -  Renal artery [place] - repeat creatinine today, make sure this is really her lab. - ARB stopped.  - Would not give IV fluid at this time, she does not look dehydrated.  4. Elevated troponin: Could be demand ischemia with hypertensive emergency but cannot rule out true ACS as above triggering the current event.  Would not cath with rise in creatinine but will need at least functional study.  Will add heparin gtt for now. Continue ASA and statin.  5. DM: Per primary service.   [name] 08/17/2014 10:16 AM

## 2014-08-17 NOTE — Progress Notes (Signed)
Subjective: Admitted 2/6 c Flash Pulm edema/CHF d/t HTN Emergency Off NTG gtt and much better Ruled in for very small demand MI CXR - Improved aeration of the lungs suggests resolving edema and atelectasis. D Dimer high but 2D ECHO not done.  She is so much better post Diuresis that VTE is low likely  Doing much better - no CP or SOB this am.  Objective: Vital signs in last 24 hours: Temp:  [98.2 F (36.8 C)-99.2 F (37.3 C)] 98.2 F (36.8 C) (02/07 0300) Pulse Rate:  [76-99] 81 (02/07 0556) Resp:  [16-26] 18 (02/07 0300) BP: (107-153)/(64-86) 133/68 mmHg (02/07 0600) SpO2:  [88 %-100 %] 93 % (02/07 0830) Weight:  [94.2 kg (207 lb 10.8 oz)] 94.2 kg (207 lb 10.8 oz) (02/07 0300) Weight change: 3.481 kg (7 lb 10.8 oz) Last BM Date: 08/16/14  CBG (last 3)   Recent Labs  08/16/14 1600 08/16/14 2120 08/17/14 0824  GLUCAP 133* 196* 148*    Intake/Output from previous day:  Intake/Output Summary (Last 24 hours) at 08/17/14 1006 Last data filed at 08/16/14 2200  Gross per 24 hour  Intake    120 ml  Output      0 ml  Net    120 ml   02/06 0701 - 02/07 0700 In: 120 [P.O.:120] Out: -    Physical Exam General appearance: A and O - Off O2.  Looks much better Eyes: no scleral icterus Throat: oropharynx moist without erythema Resp: CTA B - much better Cardio: Reg GI: soft, non-tender; bowel sounds normal; no masses,  no organomegaly Extremities: no clubbing, cyanosis or edema   Lab Results:  Recent Labs  08/16/14 0254 08/17/14 0324  NA 135 138  K 3.2* 3.4*  CL 104 103  CO2 20 27  GLUCOSE 297* 151*  BUN 14 32*  CREATININE 0.95 2.71*  CALCIUM 8.3* 8.7     Recent Labs  08/16/14 0254 08/17/14 0324  AST 24 21  ALT 15 12  ALKPHOS 98 76  BILITOT 1.0 1.0  PROT 7.1 5.9*  ALBUMIN 3.5 3.1*     Recent Labs  08/16/14 0254 08/17/14 0324  WBC 10.3 10.6*  NEUTROABS 5.4  --   HGB 13.2 10.2*  HCT 39.8 31.3*  MCV 83.8 84.4  PLT 306 214    No results  found for: INR, PROTIME   Recent Labs  08/16/14 1645 08/16/14 1910 08/17/14 0324  TROPONINI 0.21* 0.29* 0.35*     Recent Labs  08/16/14 1645  TSH 1.122    No results for input(s): VITAMINB12, FOLATE, FERRITIN, TIBC, IRON, RETICCTPCT in the last 72 hours.  Micro Results: Recent Results (from the past 240 hour(s))  MRSA PCR Screening     Status: None   Collection Time: 08/16/14  2:16 PM  Result Value Ref Range Status   MRSA by PCR NEGATIVE NEGATIVE Final    Comment:        The GeneXpert MRSA Assay (FDA approved for NASAL specimens only), is one component of a comprehensive MRSA colonization surveillance program. It is not intended to diagnose MRSA infection nor to guide or monitor treatment for MRSA infections.      Studies/Results: Dg Chest Port 1 View  08/17/2014   CLINICAL DATA:  CHF.  Shortness of breath and cough.  EXAM: PORTABLE CHEST - 1 VIEW  COMPARISON:  08/16/2014; 08/22/2004  FINDINGS: Grossly unchanged enlarged cardiac silhouette. Overall improved aeration lungs with persistent perihilar heterogeneous opacities. Mild residual pulmonary venous congestion without  frank evidence of edema. No new focal airspace opacities. Trace bilateral effusions are not excluded. No pneumothorax. Unchanged bones.  IMPRESSION: 1. Improved aeration of the lungs suggests resolving edema and atelectasis. 2. Residual pulmonary venous congestion and bilateral infrahilar atelectasis. No new focal airspace opacities.   Electronically Signed   By: Simonne Come M.D.   On: 08/17/2014 08:37   Dg Chest Port 1 View  08/16/2014   CLINICAL DATA:  Severe shortness of breath.  Respiratory distress.  EXAM: PORTABLE CHEST - 1 VIEW  COMPARISON:  Earlier the same day at 0259 hr.  FINDINGS: Heart remains at the upper limits of normal. Grossly unchanged pulmonary edema and vascular congestion. Worsening hazy opacity at the left lung base, unchanged hazy opacity at the right lung base. No pneumothorax.  Osseous structures are unchanged.  IMPRESSION: Worsening hazy opacity at the left lung base concerning for increase pleural effusion. Unchanged pulmonary edema, vascular congestion, and right pleural effusion.   Electronically Signed   By: Rubye Oaks M.D.   On: 08/16/2014 06:54   Dg Chest Port 1 View  08/16/2014   CLINICAL DATA:  Respiratory distress.  EXAM: PORTABLE CHEST - 1 VIEW  COMPARISON:  08/22/2004  FINDINGS: Heart is at the upper limits of normal. There is pulmonary edema and vascular congestion. Haziness at the lung bases likely pleural effusions. There is no pneumothorax. No acute osseous abnormalities are seen.  IMPRESSION: Vascular congestion and pulmonary edema. Likely pleural effusions. Findings suggest CHF.   Electronically Signed   By: Rubye Oaks M.D.   On: 08/16/2014 03:40     Medications: Scheduled: . [START ON 08/18/2014] amLODipine  5 mg Oral Daily  . aspirin EC  81 mg Oral Daily  . atorvastatin  20 mg Oral q1800  . insulin aspart  0-15 Units Subcutaneous TID WC  . insulin glargine  10 Units Subcutaneous Daily  . labetalol  200 mg Oral 3 times per day  . linagliptin  5 mg Oral Daily  . sodium chloride  3 mL Intravenous Q12H   Continuous: . sodium chloride 10 mL/hr at 08/16/14 0700     Assessment/Plan: Active Problems:   Flash pulmonary edema   CHF (congestive heart failure)   Hypertensive emergency   HTN (hypertension)   DM (diabetes mellitus), type 2, uncontrolled   At risk for sleep apnea   Acute kidney injury  Flash Pulm Edema c Hypoxic Resp Failure d/t HTN Emergency - She is much better post Diuresis but Cr jumped up and she ruled in for Demand MI.  Still await 2D ECHO.  She was seen by Cards yesterday am but not CARDS attending.  I need their input and case discussed c Dr Maggie Font.  Lasix is off.  Avapro on hold. Check Renal US and dopplers as well as ECHO. Transfer to floor.   NTG is off  AKI - Probably from the HTN Emergency (or lab error) -  Check Renal US.  Watch Cr.  Resend.  May have Pulm HTN and may need Outpt OSA eval.  HTN - Serum catechols on order.  BP back to Normal  TSH 1.12 and D DIMER elevated @ 2.99 - She improved c Lasix and CHF care - lower risk of PE. After discussion c Dr D we will order VQ  Hyperlipidemia - poor compliance.  LDL 149  Obesity - not morbid. Needs weight loss.  DM2 - CBGs fine - On Lantus ISS.  Off Metformin d/t Lactate and Elevated Cr-  Recent Labs  08/16/14 1600 08/16/14 2120 08/17/14 0824  GLUCAP 133* 196* 148*   DVT Prophylaxis - I lowered Lovenox from 40 to 30 and Dr D put her on Hep gtt for the Demand MI and until PE ruled out  Hbg dropped to 10.2 - recheck in am.  OK to transfer to tele.    LOS: 1 day   Krue Peterka M 08/17/2014, 10:06 AM

## 2014-08-17 NOTE — Progress Notes (Signed)
*  PRELIMINARY RESULTS* Echocardiogram 2D Echocardiogram has been performed.  Misty Miller 08/17/2014, 12:41 PM

## 2014-08-18 DIAGNOSIS — J810 Acute pulmonary edema: Secondary | ICD-10-CM

## 2014-08-18 DIAGNOSIS — I5041 Acute combined systolic (congestive) and diastolic (congestive) heart failure: Secondary | ICD-10-CM

## 2014-08-18 LAB — HEPARIN LEVEL (UNFRACTIONATED): Heparin Unfractionated: 0.24 IU/mL — ABNORMAL LOW (ref 0.30–0.70)

## 2014-08-18 LAB — BASIC METABOLIC PANEL
Anion gap: 7 (ref 5–15)
Anion gap: 10 (ref 5–15)
BUN: 35 mg/dL — ABNORMAL HIGH (ref 6–23)
BUN: 38 mg/dL — ABNORMAL HIGH (ref 6–23)
CO2: 25 mmol/L (ref 19–32)
CO2: 24 mmol/L (ref 19–32)
Calcium: 9 mg/dL (ref 8.4–10.5)
Calcium: 9.1 mg/dL (ref 8.4–10.5)
Chloride: 109 mmol/L (ref 96–112)
Chloride: 105 mmol/L (ref 96–112)
Creatinine, Ser: 2.45 mg/dL — ABNORMAL HIGH (ref 0.50–1.10)
Creatinine, Ser: 2.35 mg/dL — ABNORMAL HIGH (ref 0.50–1.10)
GFR calc Af Amer: 25 mL/min — ABNORMAL LOW (ref >90)
GFR calc Af Amer: 26 mL/min — ABNORMAL LOW (ref >90)
GFR calc non Af Amer: 21 mL/min — ABNORMAL LOW (ref >90)
GFR calc non Af Amer: 22 mL/min — ABNORMAL LOW (ref >90)
Glucose, Bld: 129 mg/dL — ABNORMAL HIGH (ref 70–99)
Glucose, Bld: 118 mg/dL — ABNORMAL HIGH (ref 70–99)
Potassium: 4 mmol/L (ref 3.5–5.1)
Potassium: 4.1 mmol/L (ref 3.5–5.1)
Sodium: 141 mmol/L (ref 135–145)
Sodium: 139 mmol/L (ref 135–145)

## 2014-08-18 LAB — GLUCOSE, CAPILLARY: Glucose-Capillary: 115 mg/dL — ABNORMAL HIGH (ref 70–99)

## 2014-08-18 LAB — HEMOGLOBIN A1C
Hgb A1c MFr Bld: 8.5 % — ABNORMAL HIGH (ref 4.8–5.6)
Hgb A1c MFr Bld: 8.8 % — ABNORMAL HIGH (ref 4.8–5.6)
Mean Plasma Glucose: 197 mg/dL
Mean Plasma Glucose: 206 mg/dL

## 2014-08-18 LAB — CBC
HCT: 30.1 % — ABNORMAL LOW (ref 36.0–46.0)
Hemoglobin: 9.9 g/dL — ABNORMAL LOW (ref 12.0–15.0)
MCH: 28.3 pg (ref 26.0–34.0)
MCHC: 32.9 g/dL (ref 30.0–36.0)
MCV: 86 fL (ref 78.0–100.0)
Platelets: 202 10*3/uL (ref 150–400)
RBC: 3.5 MIL/uL — ABNORMAL LOW (ref 3.87–5.11)
RDW: 15.6 % — ABNORMAL HIGH (ref 11.5–15.5)
WBC: 8 10*3/uL (ref 4.0–10.5)

## 2014-08-18 NOTE — Progress Notes (Addendum)
Patient Profile:  54 yo with history of HTN and diabetes presented to [facility] with acute severe dyspnea. She went to the ER with hypertensive emegency and flash pulmonary edema.   Subjective: Breathing improved. No chest pain.   Objective: Vital signs in last 24 hours: Temp:  [98.5 F (36.9 C)-99.2 F (37.3 C)] 98.9 F (37.2 C) (02/08 0453) Pulse Rate:  [77-84] 80 (02/08 0453) Resp:  [16-18] 16 (02/08 0453) BP: (118-143)/(56-85) 133/85 mmHg (02/08 0453) SpO2:  [96 %] 96 % (02/08 0453) Weight:  [206 lb 3.2 oz (93.532 kg)] 206 lb 3.2 oz (93.532 kg) (02/08 0453) Last BM Date: 08/16/14  Intake/Output from previous day: 02/07 0701 - 02/08 0700 In: 480 [P.O.:480] Out: -  Intake/Output this shift: Total I/O In: 120 [P.O.:120] Out: -   Medications Current Facility-Administered Medications  Medication Dose Route Frequency Provider Last Rate Last Dose  . 0.9 %  sodium chloride infusion   Intravenous Continuous Gwen Pounds, MD 10 mL/hr at 08/16/14 0700    . acetaminophen (TYLENOL) tablet 650 mg  650 mg Oral Q6H PRN Gwen Pounds, MD   650 mg at 08/17/14 1235   Or  . acetaminophen (TYLENOL) suppository 650 mg  650 mg Rectal Q6H PRN Gwen Pounds, MD      . amLODipine (NORVASC) tablet 5 mg  5 mg Oral Daily Laurey Morale, MD   5 mg at 08/18/14 [phone]  . aspirin EC tablet 81 mg  81 mg Oral Daily Gwen Pounds, MD   81 mg at 08/18/14 9604  . atorvastatin (LIPITOR) tablet 20 mg  20 mg Oral q1800 Gwen Pounds, MD   20 mg at 08/17/14 1835  . heparin ADULT infusion 100 units/mL (25000 units/250 mL)  1,000 Units/hr Intravenous Continuous Gaspar Garbe, MD 10 mL/hr at 08/18/14 0958 1,000 Units/hr at 08/18/14 0958  . insulin aspart (novoLOG) injection 0-15 Units  0-15 Units Subcutaneous TID WC Gwen Pounds, MD   3 Units at 08/17/14 1700  . insulin glargine (LANTUS) injection 12 Units  12 Units Subcutaneous Daily Gwen Pounds, MD   12 Units at 08/18/14 1120  . labetalol (NORMODYNE) tablet 200 mg   200 mg Oral 3 times per day Gaspar Garbe, MD   200 mg at 08/18/14 0602  . linagliptin (TRADJENTA) tablet 5 mg  5 mg Oral Daily Gwen Pounds, MD   5 mg at 08/18/14 5409  . ondansetron (ZOFRAN) tablet 4 mg  4 mg Oral Q6H PRN Gwen Pounds, MD       Or  . ondansetron [facility]) injection 4 mg  4 mg Intravenous Q6H PRN Gwen Pounds, MD      . sodium chloride 0.9 % injection 3 mL  3 mL Intravenous Q12H Gwen Pounds, MD   3 mL at 08/18/14 1001    PE: General appearance: alert, cooperative and no distress Neck: no carotid bruit and no JVD Lungs: clear to auscultation bilaterally Heart: regular rate and rhythm, S1, S2 normal, no murmur, click, rub or gallop Extremities: no LEE Pulses: 2+ and symmetric Skin: warm and dry Neurologic: Grossly normal  Lab Results:   Recent Labs  08/16/14 0254 08/17/14 0324 08/18/14 0529  WBC 10.3 10.6* 8.0  HGB 13.2 10.2* 9.9*  HCT 39.8 31.3* 30.1*  PLT 306 214 202   BMET  Recent Labs  08/17/14 0324 08/17/14 1212 08/18/14 0913  NA 138 139 141  K 3.4* 3.7 4.0  CL 103  102 109  CO2 27 27 25   GLUCOSE 151* 138* 129*  BUN 32* 34* 35*  CREATININE 2.71* 2.69* 2.45*  CALCIUM 8.7 9.2 9.0   PT/INR No results for input(s): LABPROT, INR in the last 72 hours. Cholesterol  Recent Labs  08/16/14 1645  CHOL 224*   Cardiac Panel (last 3 results)  Recent Labs  08/16/14 1645 08/16/14 1910 08/17/14 0324  TROPONINI 0.21* 0.29* 0.35*    Studies/Results: 2D echo: 08/17/14 Study Conclusions  - Left ventricle: LV strain is abnormal at -12.9% The cavity size was normal. Systolic function was moderately reduced. The estimated ejection fraction was in the range of 35% to 40%. There is akinesis of the basalinferior myocardium. Doppler parameters are consistent with elevated ventricular end-diastolic filling pressure. - Aortic valve: Trileaflet; normal thickness, mildly calcified leaflets. - Mitral valve: There was mild to moderate  regurgitation. - Left atrium: The atrium was mildly dilated.  V/Q scan 08/17/14 FINDINGS: Review of chest radiograph performed 08/17/2014 demonstrates enlarged cardiac silhouette and mediastinal contours. Persistent hypoventilation. Pulmonary vasculature remains indistinct with cephalization of flow. Grossly unchanged bibasilar heterogeneous opacities. Trace pleural effusion not excluded.  Ventilation: There is minimal clumping of inhaled radiotracer about the bilateral pulmonary hila, right greater than left. There is a minimal amount of ingested radiotracer seen within the superior aspect of the esophagus.  Perfusion: There is homogeneous distribution of injected radiotracer without discrete segmental or subsegmental mismatched filling defect to suggest pulmonary embolism.  IMPRESSION: Pulmonary embolism absent (very low probability of pulmonary embolism).    Assessment/Plan  Active Problems:   Flash pulmonary edema   CHF (congestive heart failure)   Hypertensive emergency   HTN (hypertension)   DM (diabetes mellitus), type 2, uncontrolled   At risk for sleep apnea   Acute kidney injury  1. Flash Pulmonary Edema: in the setting of hypertensive urgency. BP is improved. Renal dopplers pending. Also with systolic dysfunction with EF of 35-40% and akinesis of the basal inferior myocardium. V/Q scan low risk for PE.   2. Systolic dysfunction: EF 35-40% on echo with akinesis of the basal inferior myocardium. She is not currently a candidate for LHC due to AKI with SCr at 2.45. Recommend NST to assess for ischemia. If high risk, may consider cath if improvement in renal function. Consider adjusting BP meds for HF (change BB from labetalol  to coreg + add hydralazine and Imdur for afterload reduction (no ACE/ARB given renal function).   3. HTN: better controlled. See recs in #2.   4. AKI: Scr improving but remains elevated. Renal dopplers pending.   5. Elevated troponin: high  likelihood secondary to demand ischemia, however due to LV dysfunction with WMA, recommend continuation of heparin until stress test.     LOS: 2 days    Brittainy M. Sharol Harness, PA-C 08/18/2014 11:33 AM  I have seen and examined the patient along with Brittainy M. Sharol Harness, PA-C.  I have reviewed the chart, notes and new data.  I agree with PA's note.  PLAN: Continue vasodilators/diuretics/beta blockers for systolic CHF Differential diagnosis is hypertensive cardiomyopathy versus ischemic cardiomyopathy. Would like to avoid cath unless clearly necessary due to risk of contrast related acute renal failure. Lexiscan Myoview first.  Thurmon Fair, MD, [facility] [phone] 08/18/2014, 12:41 PM

## 2014-08-18 NOTE — Progress Notes (Signed)
ANTICOAGULATION CONSULT NOTE   Pharmacy Consult for Heparin Indication: chest pain/ACS  No Known Allergies  Labs:  Recent Labs  08/16/14 0254 08/16/14 1645 08/16/14 1910 08/17/14 0324 08/17/14 1212 08/17/14 1810 08/18/14 0529  HGB 13.2  --   --  10.2*  --   --  9.9*  HCT 39.8  --   --  31.3*  --   --  30.1*  PLT 306  --   --  214  --   --  202  HEPARINUNFRC  --   --   --   --   --  0.48 0.24*  CREATININE 0.95  --   --  2.71* 2.69*  --   --   TROPONINI 0.03 0.21* 0.29* 0.35*  --   --   --     Estimated Creatinine Clearance: 25.8 mL/min (by C-G formula based on Cr of 2.69).   Medical History: Past Medical History  Diagnosis Date  . Hypertension     Assessment: 54 year old female continues on heparin for chest pain Heparin level low this AM at 0.24 No bleeding noted  Goal of Therapy:  Heparin level 0.3-0.7 units/ml Monitor platelets by anticoagulation protocol: Yes   Plan:  Increase heparin to 1000 units / hr Follow up AM labs  Thank you. Okey Regal, PharmD [phone] 08/18/2014 9:34 AM

## 2014-08-18 NOTE — Progress Notes (Signed)
Subjective: Feeling ok this AM.  Family in room with her   Her sister is also my patient and was present as well.  Notes that since she had lost insurance she is working for a [employer] and is at this point on her [employer], but was not covered for some time.  Was refilling meds that had year refills still active, but not taking them every day in an effort to "stretch them out" as she had not been see by me since 2014.  Objective: Vital signs in last 24 hours: Temp:  [98.2 F (36.8 C)-99.2 F (37.3 C)] 98.9 F (37.2 C) (02/08 0453) Pulse Rate:  [77-84] 80 (02/08 0453) Resp:  [16-18] 16 (02/08 0453) BP: (118-143)/(56-85) 133/85 mmHg (02/08 0453) SpO2:  [91 %-96 %] 96 % (02/08 0453) Weight:  [93.532 kg (206 lb 3.2 oz)] 93.532 kg (206 lb 3.2 oz) (02/08 0453) Weight change: -0.668 kg (-1 lb 7.6 oz) Last BM Date: 08/16/14  Intake/Output from previous day: 02/07 0701 - 02/08 0700 In: 480 [P.O.:480] Out: -  Intake/Output this shift: Total I/O In: 120 [P.O.:120] Out: -   General appearance: alert, cooperative and appears stated age Neck: no adenopathy, no carotid bruit, no JVD, supple, symmetrical, trachea midline and thyroid not enlarged, symmetric, no tenderness/mass/nodules Resp: clear to auscultation bilaterally Cardio: regular rate and rhythm, S1, S2 normal, no murmur, click, rub or gallop GI: soft, non-tender; bowel sounds normal; no masses,  no organomegaly Extremities: extremities normal, atraumatic, no cyanosis or edema Skin: Skin color, texture, turgor normal. No rashes or lesions Neurologic: Grossly normal  Lab Results:  Recent Labs  08/17/14 0324 08/18/14 0529  WBC 10.6* 8.0  HGB 10.2* 9.9*  HCT 31.3* 30.1*  PLT 214 202   BMET  Recent Labs  08/17/14 0324 08/17/14 1212  NA 138 139  K 3.4* 3.7  CL 103 102  CO2 27 27  GLUCOSE 151* 138*  BUN 32* 34*  CREATININE 2.71* 2.69*  CALCIUM 8.7 9.2    Studies/Results: US Renal  08/17/2014   CLINICAL  DATA:  Acute kidney injury  EXAM: RENAL/URINARY TRACT ULTRASOUND COMPLETE  COMPARISON:  None.  FINDINGS: Right Kidney:  Length: 11.9 cm. Echogenic renal parenchyma, suggesting medical renal disease. No hydronephrosis.  Left Kidney:  Length: 11.3 cm. Echogenic renal parenchyma, suggesting medical renal disease. No hydronephrosis.  Bladder:  Underdistended but unremarkable.  Additional comments: Contracted gallbladder with possible gallstones. Small bilateral pleural effusions.  IMPRESSION: Echogenic renal parenchyma, suggesting medical renal disease.  No hydronephrosis.   Electronically Signed   By: Charline Bills M.D.   On: 08/17/2014 13:22   Nm Pulmonary Perf And Vent  08/17/2014   CLINICAL DATA:  Shortness of breath for 5 days. History of hypertension. Former smoker. Evaluate for pulmonary embolism.  EXAM: NUCLEAR MEDICINE VENTILATION - PERFUSION LUNG SCAN  TECHNIQUE: Ventilation images were obtained in multiple projections using inhaled aerosol technetium 99 M DTPA. Perfusion images were obtained in multiple projections after intravenous injection of Tc-71m MAA.  RADIOPHARMACEUTICALS:  40 mCi Tc-34m DTPA aerosol and 6 mCi Tc-56m MAA  COMPARISON:  Chest radiograph - 08/17/2014; 08/16/2014; 08/22/2004  FINDINGS: Review of chest radiograph performed 08/17/2014 demonstrates enlarged cardiac silhouette and mediastinal contours. Persistent hypoventilation. Pulmonary vasculature remains indistinct with cephalization of flow. Grossly unchanged bibasilar heterogeneous opacities. Trace pleural effusion not excluded.  Ventilation: There is minimal clumping of inhaled radiotracer about the bilateral pulmonary hila, right greater than left. There is a minimal amount of ingested radiotracer seen  within the superior aspect of the esophagus.  Perfusion: There is homogeneous distribution of injected radiotracer without discrete segmental or subsegmental mismatched filling defect to suggest pulmonary embolism.  IMPRESSION:  Pulmonary embolism absent (very low probability of pulmonary embolism).   Electronically Signed   By: Simonne Come M.D.   On: 08/17/2014 15:11   Dg Chest Port 1 View  08/17/2014   CLINICAL DATA:  CHF.  Shortness of breath and cough.  EXAM: PORTABLE CHEST - 1 VIEW  COMPARISON:  08/16/2014; 08/22/2004  FINDINGS: Grossly unchanged enlarged cardiac silhouette. Overall improved aeration lungs with persistent perihilar heterogeneous opacities. Mild residual pulmonary venous congestion without frank evidence of edema. No new focal airspace opacities. Trace bilateral effusions are not excluded. No pneumothorax. Unchanged bones.  IMPRESSION: 1. Improved aeration of the lungs suggests resolving edema and atelectasis. 2. Residual pulmonary venous congestion and bilateral infrahilar atelectasis. No new focal airspace opacities.   Electronically Signed   By: Simonne Come M.D.   On: 08/17/2014 08:37   ------------------------------------------------------------------- Transthoracic Echocardiography  Patient:  Misty Miller, Misty Miller MR #:    [id number] Study Date: 08/17/2014 Gender:   F Age:    54 Height:   157.5 cm Weight:   93.9 kg BSA:    2.07 m^2 Pt. Status: Room:    [facility]  ATTENDING  Tisovec, Adelfa Koh Christa See, Margit Banda REFERRING  Gwen Pounds SONOGRAPHER Jeryl Columbia ADMITTING  Pricilla Loveless T PERFORMING  [facility], Inpatient  cc:  ------------------------------------------------------------------- LV EF: 35% -  40%  ------------------------------------------------------------------- Indications:   CHF - 428.0.  ------------------------------------------------------------------- History:  PMH: Flash Pulmonary Edema. Congestive heart failure. Risk factors: Hypertension. Diabetes mellitus.  ------------------------------------------------------------------- Study Conclusions  - Left ventricle: LV strain is abnormal at -12.9% The cavity  size was normal. Systolic function was moderately reduced. The estimated ejection fraction was in the range of 35% to 40%. There is akinesis of the basalinferior myocardium. Doppler parameters are consistent with elevated ventricular end-diastolic filling pressure. - Aortic valve: Trileaflet; normal thickness, mildly calcified leaflets. - Mitral valve: There was mild to moderate regurgitation. - Left atrium: The atrium was mildly dilated.  Transthoracic echocardiography. M-mode, complete 2D, spectral Doppler, and color Doppler. Birthdate: Patient birthdate: 01-19-1961. Age: Patient is 54 yr old. Sex: Gender: female. BMI: 37.9 kg/m^2. Blood pressure:   125/67 Patient status: Inpatient. Study date: Study date: 08/17/2014. Study time: 12:06 PM. Location: Bedside.  -------------------------------------------------------------------  ------------------------------------------------------------------- Left ventricle: LV strain is abnormal at -12.9% The cavity size was normal. Systolic function was moderately reduced. The estimated ejection fraction was in the range of 35% to 40%. Regional wall motion abnormalities:  There is akinesis of the basalinferior myocardium. Doppler parameters are consistent with elevated ventricular end-diastolic filling pressure.  ------------------------------------------------------------------- Aortic valve:  Trileaflet; normal thickness, mildly calcified leaflets. Mobility was not restricted. Doppler: Transvalvular velocity was within the normal range. There was no stenosis. There was no regurgitation.  ------------------------------------------------------------------- Aorta: Aortic root: The aortic root was normal in size.  ------------------------------------------------------------------- Mitral valve:  Structurally normal valve.  Mobility was not restricted. Doppler: Transvalvular velocity was within the  normal range. There was no evidence for stenosis. There was mild to moderate regurgitation.  Peak gradient (D): 5 mm Hg.  ------------------------------------------------------------------- Left atrium: The atrium was mildly dilated.  ------------------------------------------------------------------- Right ventricle: The cavity size was normal. Wall thickness was normal. Systolic function was normal.  ------------------------------------------------------------------- Pulmonic valve:  Doppler: Transvalvular velocity was within the normal range. There was no evidence for stenosis.  -------------------------------------------------------------------  Tricuspid valve:  Structurally normal valve.  Doppler: Transvalvular velocity was within the normal range. There was no regurgitation.  ------------------------------------------------------------------- Pulmonary artery:  The main pulmonary artery was normal-sized. Systolic pressure was within the normal range.  ------------------------------------------------------------------- Right atrium: The atrium was normal in size.  ------------------------------------------------------------------- Pericardium: There was no pericardial effusion.  ------------------------------------------------------------------- Systemic veins: Inferior vena cava: The vessel was normal in size.  ------------------------------------------------------------------- Measurements  Left ventricle               Value    Reference LV ID, ED, PLAX chordal           51  mm   43 - 52 LV ID, ES, PLAX chordal           38  mm   23 - 38 LV fx shortening, PLAX chordal   (L)   25  %   >=29 LV PW thickness, ED             13  mm   --------- IVS/LV PW ratio, ED             0.77     <=1.3 LV e&', lateral               21.1 cm/s  --------- LV  E/e&', lateral              5.12     --------- LV e&', medial                17.4 cm/s  --------- LV E/e&', medial               6.21     --------- LV e&', average               19.25 cm/s  --------- LV E/e&', average              5.61     ---------  Ventricular septum             Value    Reference IVS thickness, ED              10  mm   ---------  Aorta                    Value    Reference Aortic root ID, ED             29  mm   ---------  Left atrium                 Value    Reference LA ID, A-P, ES               43  mm   --------- LA ID/bsa, A-P               2.07 cm/m^2 <=2.2 LA volume, S                67.5 ml   --------- LA volume/bsa, S              32.5 ml/m^2 --------- LA volume, ES, 1-p A4C           65.2 ml   --------- LA volume/bsa, ES, 1-p A4C         31.4 ml/m^2 --------- LA volume, ES, 1-p A2C           69.2 ml   --------- LA volume/bsa, ES, 1-p A2C  33.4 ml/m^2 ---------  Mitral valve                Value    Reference Mitral E-wave peak velocity         108  cm/s  --------- Mitral A-wave peak velocity         75.3 cm/s  --------- Mitral deceleration time          162  ms   150 - 230 Mitral peak gradient, D           5   mm Hg --------- Mitral E/A ratio, peak           1.4     --------- Mitral maximal regurg velocity,       584  cm/s  --------- PISA Mitral regurg VTI, PISA           171  cm   ---------  Systemic veins               Value    Reference Estimated CVP                3   mm Hg  ---------  Legend: (L) and (H) mark values outside specified reference range.  ------------------------------------------------------------------- Prepared and Electronically Authenticated by  Armanda Magic, MD 2016-02-07T13:14:00 Medications:  I have reviewed the patient's current medications. Scheduled: . amLODipine  5 mg Oral Daily  . aspirin EC  81 mg Oral Daily  . atorvastatin  20 mg Oral q1800  . insulin aspart  0-15 Units Subcutaneous TID WC  . insulin glargine  12 Units Subcutaneous Daily  . labetalol  200 mg Oral 3 times per day  . linagliptin  5 mg Oral Daily  . sodium chloride  3 mL Intravenous Q12H   Continuous: . sodium chloride 10 mL/hr at 08/16/14 0700  . heparin 850 Units/hr (08/17/14 1123)   HQI:[id number] **OR** acetaminophen, ondansetron **OR** ondansetron (ZOFRAN) IV  Assessment/Plan: Chf with flash pulmonary edema.  Her echo result shows EF moderately reduced with inferior/basal hypokinesis.  This is coupled with elevation in troponins as well.  Cardiology following and while she would be a good candidate for heart cath, her increased renal indices may mean the need for medical therapy and to attempt at a later date. Acute renal insufficiency:  Her BMET is yet to result, and ? If lost today, will order a stat read so we can have a result for when Cards sees her later today. DM2:  Given renal insufficiency and possible cath, Metformin held.  Tradjenta + Lantus + SSI for now, wil reconsider other orals once renal indices settle down. Hyperlipidemia:  Atorvastatin HTN:  Off ARB due to acute renal insufficiency.  Likely chronic poor control with rationing, but hard to know baseline as had not been seen by me since 2014.  Pheo workup pending but doubt this will be positive. Anemia:  9.9, will watch on Heparin, anemia panel for tomorrow  Dispo:  Likely home tomorrow if no intervention planned with close medical followup.  This will depend on her Renal indices  and Cards impression as she clearly has an abnormal echo suggestive of infarct/ischemia/   LOS: 2 days   TISOVEC,RICHARD W 08/18/2014, 8:51 AM

## 2014-08-18 NOTE — Progress Notes (Signed)
Nutrition Education Note  RD consulted for nutrition education for low salt diet and weight loss for patient with diabetes.   RD provided "Low Sodium Nutrition Therapy" handout from the Academy of [employer]. Reviewed patient's dietary recall. Provided examples on ways to decrease sodium intake in diet. Discouraged intake of processed foods and use of salt shaker. Encouraged fresh fruits and vegetables as well as whole grain sources of carbohydrates to maximize fiber intake. Discussed ways to balance meals and snacks to better control blood sugar. Answered patient's questions regarding diabetic diet. Pt reports being noncompliant with her medications and diet PTA but, she is ready to get back on track. Pt expresses interest in losing weight. RD provided and discussed "Weight loss Tips" from the Academy of [employer] and [employer]etics. Discussed appropriate portion sizes of carbohydrate containing foods and provided sample menus.   RD discussed why it is important for patient to adhere to diet recommendations, and emphasized foods to avoid. Teach back method used.  Expect good compliance.  Body mass index is 37.71 kg/(m^2). Pt meets criteria for Obesity based on current BMI.  Current diet order is Heart Healthy/Carb Modified, patient is consuming approximately 75-100% of meals at this time. Labs and medications reviewed. No further nutrition interventions warranted at this time. RD contact information provided. If additional nutrition issues arise, please re-consult RD.   Ian Malkin RD, LDN Inpatient Clinical Dietitian Pager: [phone] After Hours Pager: [phone]

## 2014-08-19 ENCOUNTER — Other Ambulatory Visit (HOSPITAL_COMMUNITY): Payer: Managed Care, Other (non HMO)

## 2014-08-19 ENCOUNTER — Inpatient Hospital Stay (HOSPITAL_COMMUNITY): Payer: Managed Care, Other (non HMO)

## 2014-08-19 DIAGNOSIS — R079 Chest pain, unspecified: Secondary | ICD-10-CM

## 2014-08-19 DIAGNOSIS — E1165 Type 2 diabetes mellitus with hyperglycemia: Secondary | ICD-10-CM

## 2014-08-19 LAB — GLUCOSE, CAPILLARY
Glucose-Capillary: 123 mg/dL — ABNORMAL HIGH (ref 70–99)
Glucose-Capillary: 103 mg/dL — ABNORMAL HIGH (ref 70–99)
Glucose-Capillary: 119 mg/dL — ABNORMAL HIGH (ref 70–99)
Glucose-Capillary: 144 mg/dL — ABNORMAL HIGH (ref 70–99)
Glucose-Capillary: 85 mg/dL (ref 70–99)
Glucose-Capillary: 147 mg/dL — ABNORMAL HIGH (ref 70–99)

## 2014-08-19 LAB — HEPARIN LEVEL (UNFRACTIONATED): Heparin Unfractionated: 0.21 IU/mL — ABNORMAL LOW (ref 0.30–0.70)

## 2014-08-19 MED ORDER — REGADENOSON 0.4 MG/5ML IV SOLN
0.4000 mg | Freq: Once | INTRAVENOUS | Status: AC
  Administered 2014-08-19: 0.4 mg via INTRAVENOUS
  Filled 2014-08-19: qty 5

## 2014-08-19 MED ORDER — TECHNETIUM TC 99M SESTAMIBI GENERIC - CARDIOLITE
30.0000 | Freq: Once | INTRAVENOUS | Status: AC | PRN
  Administered 2014-08-19: 30 via INTRAVENOUS

## 2014-08-19 MED ORDER — TECHNETIUM TC 99M SESTAMIBI GENERIC - CARDIOLITE
10.0000 | Freq: Once | INTRAVENOUS | Status: AC | PRN
  Administered 2014-08-19: 10 via INTRAVENOUS

## 2014-08-19 MED ORDER — CARVEDILOL 12.5 MG PO TABS
12.5000 mg | ORAL_TABLET | Freq: Two times a day (BID) | ORAL | Status: DC
  Administered 2014-08-19 – 2014-08-20 (×2): 12.5 mg via ORAL
  Filled 2014-08-19 (×5): qty 1

## 2014-08-19 MED ORDER — REGADENOSON 0.4 MG/5ML IV SOLN
INTRAVENOUS | Status: AC
  Filled 2014-08-19: qty 5

## 2014-08-19 NOTE — Progress Notes (Signed)
    Subjective:  No SOB  Objective:  Vital Signs in the last 24 hours: Temp:  [98.7 F (37.1 C)-99.4 F (37.4 C)] 99.4 F (37.4 C) (02/09 0444) Pulse Rate:  [79-81] 79 (02/09 0444) Resp:  [18] 18 (02/09 0444) BP: (121-140)/(70-86) 121/74 mmHg (02/09 0444) SpO2:  [97 %-98 %] 97 % (02/09 0444)  Intake/Output from previous day:  Intake/Output Summary (Last 24 hours) at 08/19/14 0930 Last data filed at 08/18/14 1804  Gross per 24 hour  Intake  32202 ml  Output      0 ml  Net  12170 ml    Physical Exam: General appearance: alert, cooperative, no distress and mildly obese Lungs: scattered crackles Heart: regular rate and rhythm   Rate: 78  Rhythm: normal sinus rhythm  Lab Results:  Recent Labs  08/17/14 0324 08/18/14 0529  WBC 10.6* 8.0  HGB 10.2* 9.9*  PLT 214 202    Recent Labs  08/18/14 0849 08/18/14 0913  NA 139 141  K 4.1 4.0  CL 105 109  CO2 24 25  GLUCOSE 118* 129*  BUN 38* 35*  CREATININE 2.35* 2.45*    Recent Labs  08/16/14 1910 08/17/14 0324  TROPONINI 0.29* 0.35*   No results for input(s): INR in the last 72 hours.  Imaging: Imaging results have been reviewed  Cardiac Studies:  Assessment/Plan:   Active Problems:   Flash pulmonary edema   CHF (congestive heart failure)   Hypertensive emergency   HTN (hypertension)   DM (diabetes mellitus), type 2, uncontrolled   At risk for sleep apnea   Acute kidney injury   PLAN: Lexsiscan Myoview this am  Corine Shelter PA-C Beeper [phone] 08/19/2014, 9:30 AM   Tolerated Lexiscan well, images pending I have seen and examined the patient along with Corine Shelter PA-C.  I have reviewed the chart, notes and new data.  I agree with PA's note.  Intake of 12L is probably an error, as is the output of zero. Weight is down 1 lb since yesterday  PLAN: Further diagnostic evaluation based on Myoview results. Prefer carvedilol to labetalol for depressed LVEF  Thurmon Fair, MD,  [facility] [phone] 08/19/2014, 10:38 AM

## 2014-08-19 NOTE — Plan of Care (Signed)
Problem: Phase III Progression Outcomes Goal: Other Phase III Outcomes/Goals Outcome: Progressing Pt verbalizes NPO status for am stress test

## 2014-08-19 NOTE — Progress Notes (Signed)
Subjective: No issues overnight.  Stress test this AM.  Objective: Vital signs in last 24 hours: Temp:  [98.7 F (37.1 C)-99.4 F (37.4 C)] 99.4 F (37.4 C) (02/09 0444) Pulse Rate:  [79-81] 79 (02/09 0444) Resp:  [18] 18 (02/09 0444) BP: (121-140)/(70-86) 121/74 mmHg (02/09 0444) SpO2:  [97 %-98 %] 97 % (02/09 0444) Weight change:  Last BM Date: 08/16/14  Intake/Output from previous day: 02/08 0701 - 02/09 0700 In: 16109 [P.O.:12290] Out: -  Intake/Output this shift:   General appearance: alert, cooperative and appears stated age Neck: no adenopathy, no carotid bruit, no JVD, supple, symmetrical, trachea midline and thyroid not enlarged, symmetric, no tenderness/mass/nodules Resp: clear to auscultation bilaterally Cardio: regular rate and rhythm, S1, S2 normal, no murmur, click, rub or gallop GI: soft, non-tender; bowel sounds normal; no masses, no organomegaly Extremities: extremities normal, atraumatic, no cyanosis or edema Skin: Skin color, texture, turgor normal. No rashes or lesions Neurologic: Grossly normal  Lab Results:  Recent Labs  08/17/14 0324 08/18/14 0529  WBC 10.6* 8.0  HGB 10.2* 9.9*  HCT 31.3* 30.1*  PLT 214 202   BMET  Recent Labs  08/18/14 0849 08/18/14 0913  NA 139 141  K 4.1 4.0  CL 105 109  CO2 24 25  GLUCOSE 118* 129*  BUN 38* 35*  CREATININE 2.35* 2.45*  CALCIUM 9.1 9.0    Studies/Results: US Renal  08/17/2014   CLINICAL DATA:  Acute kidney injury  EXAM: RENAL/URINARY TRACT ULTRASOUND COMPLETE  COMPARISON:  None.  FINDINGS: Right Kidney:  Length: 11.9 cm. Echogenic renal parenchyma, suggesting medical renal disease. No hydronephrosis.  Left Kidney:  Length: 11.3 cm. Echogenic renal parenchyma, suggesting medical renal disease. No hydronephrosis.  Bladder:  Underdistended but unremarkable.  Additional comments: Contracted gallbladder with possible gallstones. Small bilateral pleural effusions.  IMPRESSION: Echogenic renal parenchyma,  suggesting medical renal disease.  No hydronephrosis.   Electronically Signed   By: Charline Bills M.D.   On: 08/17/2014 13:22   Nm Pulmonary Perf And Vent  08/17/2014   CLINICAL DATA:  Shortness of breath for 5 days. History of hypertension. Former smoker. Evaluate for pulmonary embolism.  EXAM: NUCLEAR MEDICINE VENTILATION - PERFUSION LUNG SCAN  TECHNIQUE: Ventilation images were obtained in multiple projections using inhaled aerosol technetium 99 M DTPA. Perfusion images were obtained in multiple projections after intravenous injection of Tc-62m MAA.  RADIOPHARMACEUTICALS:  40 mCi Tc-62m DTPA aerosol and 6 mCi Tc-109m MAA  COMPARISON:  Chest radiograph - 08/17/2014; 08/16/2014; 08/22/2004  FINDINGS: Review of chest radiograph performed 08/17/2014 demonstrates enlarged cardiac silhouette and mediastinal contours. Persistent hypoventilation. Pulmonary vasculature remains indistinct with cephalization of flow. Grossly unchanged bibasilar heterogeneous opacities. Trace pleural effusion not excluded.  Ventilation: There is minimal clumping of inhaled radiotracer about the bilateral pulmonary hila, right greater than left. There is a minimal amount of ingested radiotracer seen within the superior aspect of the esophagus.  Perfusion: There is homogeneous distribution of injected radiotracer without discrete segmental or subsegmental mismatched filling defect to suggest pulmonary embolism.  IMPRESSION: Pulmonary embolism absent (very low probability of pulmonary embolism).   Electronically Signed   By: Simonne Come M.D.   On: 08/17/2014 15:11    Medications:  I have reviewed the patient's current medications. Scheduled: . amLODipine  5 mg Oral Daily  . aspirin EC  81 mg Oral Daily  . atorvastatin  20 mg Oral q1800  . insulin aspart  0-15 Units Subcutaneous TID WC  . insulin glargine  12  Units Subcutaneous Daily  . labetalol  200 mg Oral 3 times per day  . linagliptin  5 mg Oral Daily  . sodium chloride  3  mL Intravenous Q12H   Continuous: . sodium chloride 10 mL/hr at 08/16/14 0700  . heparin 1,000 Units/hr (08/18/14 1141)   TWS:[id number] **OR** acetaminophen, ondansetron **OR** ondansetron (ZOFRAN) IV  Assessment/Plan: CHF with flash pulmonary edema. Her echo result shows EF moderately reduced with inferior/basal hypokinesis. This is coupled with elevation in troponins as well. Cardiology following and while she would be a good candidate for heart cath, her increased renal indices may mean the need for medical therapy and to attempt at a later date.  Stress test this AM. Acute renal insufficiency: Her BMET is yet to result, and ? If lost today, will order a stat read so we can have a result for when Cards sees her later today. DM2: Given renal insufficiency and possible cath, Metformin held. Tradjenta + Lantus + SSI for now, wil reconsider other orals once renal indices settle down. Hyperlipidemia: Atorvastatin HTN: Off ARB due to acute renal insufficiency. Likely chronic poor control with rationing, but hard to know baseline as had not been seen by me since 2014. Pheo workup pending but doubt this will be positive. Anemia: 9.9, will watch on Heparin, anemia panel for tomorrow  Dispo: If Cards can get stress test and meds finalized by end of day, possibly home after stress test as she is clinically stable with close f/u at my office next week for repeat renal indices and DM med adjustment.   LOS: 3 days   Teresea Donley W 08/19/2014, 7:54 AM

## 2014-08-19 NOTE — Progress Notes (Signed)
Called Dr. Wylene Simmer to see if results of stress test or plan to discharge were ready. Dr. Wylene Simmer has deferred to cardiology to give results to pt.  Will inform pt. Pt resting with call bell within reach.  Will continue to monitor. Thomas Hoff, RN

## 2014-08-20 LAB — HEPARIN LEVEL (UNFRACTIONATED): Heparin Unfractionated: 0.24 IU/mL — ABNORMAL LOW (ref 0.30–0.70)

## 2014-08-20 LAB — CBC
HCT: 31 % — ABNORMAL LOW (ref 36.0–46.0)
Hemoglobin: 10 g/dL — ABNORMAL LOW (ref 12.0–15.0)
MCH: 27.6 pg (ref 26.0–34.0)
MCHC: 32.3 g/dL (ref 30.0–36.0)
MCV: 85.6 fL (ref 78.0–100.0)
Platelets: 213 10*3/uL (ref 150–400)
RBC: 3.62 MIL/uL — ABNORMAL LOW (ref 3.87–5.11)
RDW: 15.5 % (ref 11.5–15.5)
WBC: 7.5 10*3/uL (ref 4.0–10.5)

## 2014-08-20 LAB — GLUCOSE, CAPILLARY
Glucose-Capillary: 98 mg/dL (ref 70–99)
Glucose-Capillary: 116 mg/dL — ABNORMAL HIGH (ref 70–99)
Glucose-Capillary: 124 mg/dL — ABNORMAL HIGH (ref 70–99)

## 2014-08-20 LAB — BASIC METABOLIC PANEL
Anion gap: 6 (ref 5–15)
BUN: 25 mg/dL — ABNORMAL HIGH (ref 6–23)
CO2: 23 mmol/L (ref 19–32)
Calcium: 8.9 mg/dL (ref 8.4–10.5)
Chloride: 108 mmol/L (ref 96–112)
Creatinine, Ser: 1.78 mg/dL — ABNORMAL HIGH (ref 0.50–1.10)
GFR calc Af Amer: 36 mL/min — ABNORMAL LOW (ref >90)
GFR calc non Af Amer: 31 mL/min — ABNORMAL LOW (ref >90)
Glucose, Bld: 127 mg/dL — ABNORMAL HIGH (ref 70–99)
Potassium: 4.3 mmol/L (ref 3.5–5.1)
Sodium: 137 mmol/L (ref 135–145)

## 2014-08-20 MED ORDER — ASPIRIN 81 MG PO TBEC: 81.0000 mg | DELAYED_RELEASE_TABLET | Freq: Every day | ORAL | Status: DC

## 2014-08-20 MED ORDER — INSULIN PEN NEEDLE 32G X 5 MM MISC: 1.0000 | Freq: Every day | Status: DC

## 2014-08-20 MED ORDER — CARVEDILOL 12.5 MG PO TABS: 12.5000 mg | ORAL_TABLET | Freq: Two times a day (BID) | ORAL | Status: DC

## 2014-08-20 MED ORDER — INSULIN GLARGINE 100 UNIT/ML SOLOSTAR PEN: 12.0000 [IU] | PEN_INJECTOR | Freq: Every day | SUBCUTANEOUS | Status: DC

## 2014-08-20 MED ORDER — ATORVASTATIN CALCIUM 20 MG PO TABS: 20.0000 mg | ORAL_TABLET | Freq: Every day | ORAL | Status: DC

## 2014-08-20 MED ORDER — LINAGLIPTIN 5 MG PO TABS: 5.0000 mg | ORAL_TABLET | Freq: Every day | ORAL | Status: DC

## 2014-08-20 MED ORDER — AMLODIPINE BESYLATE 5 MG PO TABS: 10.0000 mg | ORAL_TABLET | Freq: Every day | ORAL | Status: DC

## 2014-08-20 NOTE — Progress Notes (Signed)
Patient is pending discharge. Echo shows EF 35-40%, akinesis of basal inferior myocardium. Myoview yesterday showed intermediate risk study, no ischemia or scar, EF 35%. BP better controlled.  Will arrange Cardiology followup. Expect uptitrate coreg on followup. Renal function prevent addition of ACEI or ARB, will add hydralazine and Imdur as BP can tolerate. Plan to repeat echo in 3 month.  Cardiology will sign off.   Ramond Dial PA Pager: [phone]

## 2014-08-20 NOTE — Progress Notes (Signed)
Heart Failure Navigator Consult Note  Presentation: Misty Miller is a 69 F c PMH c/w DM2, Lipids, obesity and HTN. Dxed c HTN 1996 c Pre-eclampsia and has been on 3-4 drugs for control lately. She has not seen Dr Wylene Simmer since 04/20/13. In fact 4 of last 6 appts on our EMR were No Shows. She reports lost her job and insurance and had no money. Somehow she continued to take her meds. She reports she had been feeling well up until Wednesday. Since then she had progressive DOE. She reports SBPs at home 140-200. Denies recent CP, sig LE, edema or other Sxs. She said she saw a cardiologist and had ECHO in past but could not tell me name, yr, or practice. I could find nothing in our EMR or on EPIC. Tonight @ 12:30 am she was brought in by ambulance with Hypoxic Resp Failurte/respiratory distress that began prior to arrival. + shortness of breath, wheezing and productive cough with clear sputum that initially began yesterday and suddenly worsened prior to arrival. She reports associated mild edema of legs bilaterally onset yesterday. She denies associated fever, chills, or chest pain. She denies PMHx of COPD, bhronchitis, emphysema. She denies prior history of heart problems. Patient states she last took her BP medication less than 24 hours ago. EKG showed Sinus Tachy, Lungs sounded wet, sats were low. EMS gave Nitro x 2. Albuterol 5mg . 94% on Bi-Pap. 178/110 CBG 240. 20G LFA. ABG was OK. Cr was only 0.95. Trop I was (-), BNP was 158. CXR c/w Vascular congestion and pulmonary edema. Likely pleural effusions. Findings suggest CHF. EDP discussed case c Cards and CCM and recommended admit buy med/PCP. She was placed on NTG gtt. Bipap was continued and converted to NRB mask. She had Emesis. After awhile Sats stabilized. Currently in ED she is not in distress. She is talking full and complete sentences unlabored and doing better. NO current CP.   Past Medical History  Diagnosis Date  .  Hypertension     History   Social History  . Marital Status: Married    Spouse Name: N/A  . Number of Children: N/A  . Years of Education: N/A   Social History Main Topics  . Smoking status: Never Smoker   . Smokeless tobacco: Not on file  . Alcohol Use: No  . Drug Use: No  . Sexual Activity: Not on file   Other Topics Concern  . None   Social History Narrative    ECHO:Study Conclusions--08/17/14  - Left ventricle: LV strain is abnormal at -12.9% The cavity size was normal. Systolic function was moderately reduced. The estimated ejection fraction was in the range of 35% to 40%. There is akinesis of the basalinferior myocardium. Doppler parameters are consistent with elevated ventricular end-diastolic filling pressure. - Aortic valve: Trileaflet; normal thickness, mildly calcified leaflets. - Mitral valve: There was mild to moderate regurgitation. - Left atrium: The atrium was mildly dilated.  Transthoracic echocardiography. M-mode, complete 2D, spectral Doppler, and color Doppler. Birthdate: Patient birthdate: [birth year]. Age: Patient is 54 yr old. Sex: Gender: female. BMI: 37.9 kg/m^2. Blood pressure:   125/67 Patient status: Inpatient. Study date: Study date: 08/17/2014. Study time: 12:06 PM. Location: Bedside.  BNP    Component Value Date/Time   BNP 185.1* 08/17/2014 0324    ProBNP No results found for: PROBNP   Education Assessment and Provision:  Detailed education and instructions provided on heart failure disease management including the following:  Signs and symptoms of Heart Failure  When to call the physician Importance of daily weights Low sodium diet Fluid restriction Medication management Anticipated future follow-up appointments  Patient education given on each of the above topics.  Patient acknowledges understanding and acceptance of all instructions.  I spoke at length with Ms. Oshea regarding her new HF  diagnosis.  She seems very motivated and willing to make changes necessary for her health.  She is able to teach back above topics.  We discussed a low sodium diet and high sodium foods to avoid.  She knows when to call physician and has a scale for daily weights.  I encouraged her to call with me with any issues/concerns.  Education Materials:  "Living Better With Heart Failure" Booklet, Daily Weight Tracker Tool    High Risk Criteria for Readmission and/or Poor Patient Outcomes:   EF <30%- No 35-40%  2 or more admissions in 6 months- No  Difficult social situation- No  Demonstrates medication noncompliance- No   Barriers of Care:  Knowledge--new HF  Discharge Planning:   Plans to discharge to home with her daughter

## 2014-08-20 NOTE — Discharge Summary (Signed)
DISCHARGE SUMMARY  LAKEISHIA TRULUCK  MR#: [id number]  DOB:[birth year]  Date of Admission: 08/16/2014 Date of Discharge: 08/20/2014  Attending Physician:TISOVEC,RICHARD W  Patient's WUJ:WJXBJYN, Adelfa Koh  Consults:  [place], Cards  Discharge Diagnoses: Active Problems:   Flash pulmonary edema   CHF (congestive heart failure), EF 35%  Nonreversible ischemia per nuclear scan   Hypertensive emergency   HTN (hypertension)   DM (diabetes mellitus), type 2, uncontrolled   At risk for sleep apnea   Acute kidney injury, resolving   Hyperlipidemia   Obesity   Medicine and visit noncompliance    Discharge Medications:   Medication List    STOP taking these medications        amoxicillin 500 MG capsule  Commonly known as:  AMOXIL     hydrochlorothiazide 25 MG tablet  Commonly known as:  HYDRODIURIL     labetalol 200 MG tablet  Commonly known as:  NORMODYNE     lisinopril 10 MG tablet  Commonly known as:  PRINIVIL,ZESTRIL      TAKE these medications        amLODipine 5 MG tablet  Commonly known as:  NORVASC  Take 2 tablets (10 mg total) by mouth daily.     aspirin 81 MG EC tablet  Take 1 tablet (81 mg total) by mouth daily.     atorvastatin 20 MG tablet  Commonly known as:  LIPITOR  Take 1 tablet (20 mg total) by mouth daily at 6 PM.     carvedilol 12.5 MG tablet  Commonly known as:  COREG  Take 1 tablet (12.5 mg total) by mouth 2 (two) times daily with a meal.     Insulin Glargine 100 UNIT/ML Solostar Pen  Commonly known as:  LANTUS SOLOSTAR  Inject 12 Units into the skin daily at 10 pm.     Insulin Pen Needle 32G X 5 MM Misc  1 Mutually Defined by Does not apply route daily at 8 pm.     linagliptin 5 MG Tabs tablet  Commonly known as:  TRADJENTA  Take 1 tablet (5 mg total) by mouth daily.        Hospital Procedures: US Renal  08/17/2014   CLINICAL DATA:  Acute kidney injury  EXAM: RENAL/URINARY TRACT ULTRASOUND COMPLETE  COMPARISON:  None.  FINDINGS:  Right Kidney:  Length: 11.9 cm. Echogenic renal parenchyma, suggesting medical renal disease. No hydronephrosis.  Left Kidney:  Length: 11.3 cm. Echogenic renal parenchyma, suggesting medical renal disease. No hydronephrosis.  Bladder:  Underdistended but unremarkable.  Additional comments: Contracted gallbladder with possible gallstones. Small bilateral pleural effusions.  IMPRESSION: Echogenic renal parenchyma, suggesting medical renal disease.  No hydronephrosis.   Electronically Signed   By: Charline Bills M.D.   On: 08/17/2014 13:22   Nm Myocar Multi W/spect W/wall Motion / Ef  08/19/2014   CLINICAL DATA:  Chest pain  EXAM: MYOCARDIAL IMAGING WITH SPECT (REST AND PHARMACOLOGIC-STRESS)  GATED LEFT VENTRICULAR WALL MOTION STUDY  LEFT VENTRICULAR EJECTION FRACTION  TECHNIQUE: Standard myocardial SPECT imaging was performed after resting intravenous injection of 10 mCi Tc-25m sestamibi. Subsequently, intravenous infusion of Lexiscan was performed under the supervision of the Cardiology staff. At peak effect of the drug, 30 mCi Tc-38m sestamibi was injected intravenously and standard myocardial SPECT imaging was performed. Quantitative gated imaging was also performed to evaluate left ventricular wall motion, and estimate left ventricular ejection fraction.  COMPARISON:  None.  FINDINGS: Baseline EKG showed NSR with nonspecific T wave abnormality. During [employer]  infusion there were no changes from baseline EKG.  RAW images show mild breast attenuation artifact and increased gut uptake below the diaphragm.  Perfusion: No decreased activity in the left ventricle on stress imaging to suggest reversible ischemia or infarction.  Wall Motion: Dilated Left ventricular cavity with moderate to severely reduced LV function.  Left Ventricular Ejection Fraction: 35 %  End diastolic volume 149 ml  End systolic volume 97 ml  IMPRESSION: 1. No reversible ischemia or infarction.  2.  Moderate to severely reduced LV function.   3. Left ventricular ejection fraction 35%  4. Intermediate-risk stress test findings*.  *2012 Appropriate Use Criteria for Coronary Revascularization Focused Update: J Am Coll Cardiol. 2012;59(9):857-881. http://content.dementiazones.com.aspx?articleid=[email or web address]   Electronically Signed   By: Armanda Magic   On: 08/19/2014 17:37   Nm Pulmonary Perf And Vent  08/17/2014   CLINICAL DATA:  Shortness of breath for 5 days. History of hypertension. Former smoker. Evaluate for pulmonary embolism.  EXAM: NUCLEAR MEDICINE VENTILATION - PERFUSION LUNG SCAN  TECHNIQUE: Ventilation images were obtained in multiple projections using inhaled aerosol technetium 99 M DTPA. Perfusion images were obtained in multiple projections after intravenous injection of Tc-58m MAA.  RADIOPHARMACEUTICALS:  40 mCi Tc-82m DTPA aerosol and 6 mCi Tc-42m MAA  COMPARISON:  Chest radiograph - 08/17/2014; 08/16/2014; 08/22/2004  FINDINGS: Review of chest radiograph performed 08/17/2014 demonstrates enlarged cardiac silhouette and mediastinal contours. Persistent hypoventilation. Pulmonary vasculature remains indistinct with cephalization of flow. Grossly unchanged bibasilar heterogeneous opacities. Trace pleural effusion not excluded.  Ventilation: There is minimal clumping of inhaled radiotracer about the bilateral pulmonary hila, right greater than left. There is a minimal amount of ingested radiotracer seen within the superior aspect of the esophagus.  Perfusion: There is homogeneous distribution of injected radiotracer without discrete segmental or subsegmental mismatched filling defect to suggest pulmonary embolism.  IMPRESSION: Pulmonary embolism absent (very low probability of pulmonary embolism).   Electronically Signed   By: Simonne Come M.D.   On: 08/17/2014 15:11   Dg Chest Port 1 View  08/17/2014   CLINICAL DATA:  CHF.  Shortness of breath and cough.  EXAM: PORTABLE CHEST - 1 VIEW  COMPARISON:  08/16/2014; 08/22/2004  FINDINGS: Grossly  unchanged enlarged cardiac silhouette. Overall improved aeration lungs with persistent perihilar heterogeneous opacities. Mild residual pulmonary venous congestion without frank evidence of edema. No new focal airspace opacities. Trace bilateral effusions are not excluded. No pneumothorax. Unchanged bones.  IMPRESSION: 1. Improved aeration of the lungs suggests resolving edema and atelectasis. 2. Residual pulmonary venous congestion and bilateral infrahilar atelectasis. No new focal airspace opacities.   Electronically Signed   By: Simonne Come M.D.   On: 08/17/2014 08:37   Dg Chest Port 1 View  08/16/2014   CLINICAL DATA:  Severe shortness of breath.  Respiratory distress.  EXAM: PORTABLE CHEST - 1 VIEW  COMPARISON:  Earlier the same day at 0259 hr.  FINDINGS: Heart remains at the upper limits of normal. Grossly unchanged pulmonary edema and vascular congestion. Worsening hazy opacity at the left lung base, unchanged hazy opacity at the right lung base. No pneumothorax. Osseous structures are unchanged.  IMPRESSION: Worsening hazy opacity at the left lung base concerning for increase pleural effusion. Unchanged pulmonary edema, vascular congestion, and right pleural effusion.   Electronically Signed   By: Rubye Oaks M.D.   On: 08/16/2014 06:54   Dg Chest Port 1 View  08/16/2014   CLINICAL DATA:  Respiratory distress.  EXAM: PORTABLE CHEST - 1 VIEW  COMPARISON:  08/22/2004  FINDINGS: Heart is at the upper limits of normal. There is pulmonary edema and vascular congestion. Haziness at the lung bases likely pleural effusions. There is no pneumothorax. No acute osseous abnormalities are seen.  IMPRESSION: Vascular congestion and pulmonary edema. Likely pleural effusions. Findings suggest CHF.   Electronically Signed   By: Rubye Oaks M.D.   On: 08/16/2014 03:40   ------------------------------------------------------------------- Transthoracic Echocardiography  Patient:  Gailene, Youkhana MR #:     [id number] Study Date: 08/17/2014 Gender:   F Age:    57 Height:   157.5 cm Weight:   93.9 kg BSA:    2.07 m^2 Pt. Status: Room:    [facility]  ATTENDING  Tisovec, Adelfa Koh Christa See, Margit Banda REFERRING  Gwen Pounds SONOGRAPHER Jeryl Columbia ADMITTING  Pricilla Loveless T PERFORMING  [facility], Inpatient  cc:  ------------------------------------------------------------------- LV EF: 35% -  40%  ------------------------------------------------------------------- Indications:   CHF - 428.0.  ------------------------------------------------------------------- History:  PMH: Flash Pulmonary Edema. Congestive heart failure. Risk factors: Hypertension. Diabetes mellitus.  ------------------------------------------------------------------- Study Conclusions  - Left ventricle: LV strain is abnormal at -12.9% The cavity size was normal. Systolic function was moderately reduced. The estimated ejection fraction was in the range of 35% to 40%. There is akinesis of the basalinferior myocardium. Doppler parameters are consistent with elevated ventricular end-diastolic filling pressure. - Aortic valve: Trileaflet; normal thickness, mildly calcified leaflets. - Mitral valve: There was mild to moderate regurgitation. - Left atrium: The atrium was mildly dilated.  Transthoracic echocardiography. M-mode, complete 2D, spectral Doppler, and color Doppler. Birthdate: Patient birthdate: 12-16-1960. Age: Patient is 55 yr old. Sex: Gender: female. BMI: 37.9 kg/m^2. Blood pressure:   125/67 Patient status: Inpatient. Study date: Study date: 08/17/2014. Study time: 12:06 PM. Location: Bedside.  -------------------------------------------------------------------  ------------------------------------------------------------------- Left ventricle: LV strain is abnormal at -12.9% The cavity size was normal. Systolic function  was moderately reduced. The estimated ejection fraction was in the range of 35% to 40%. Regional wall motion abnormalities:  There is akinesis of the basalinferior myocardium. Doppler parameters are consistent with elevated ventricular end-diastolic filling pressure.  ------------------------------------------------------------------- Aortic valve:  Trileaflet; normal thickness, mildly calcified leaflets. Mobility was not restricted. Doppler: Transvalvular velocity was within the normal range. There was no stenosis. There was no regurgitation.  ------------------------------------------------------------------- Aorta: Aortic root: The aortic root was normal in size.  ------------------------------------------------------------------- Mitral valve:  Structurally normal valve.  Mobility was not restricted. Doppler: Transvalvular velocity was within the normal range. There was no evidence for stenosis. There was mild to moderate regurgitation.  Peak gradient (D): 5 mm Hg.  ------------------------------------------------------------------- Left atrium: The atrium was mildly dilated.  ------------------------------------------------------------------- Right ventricle: The cavity size was normal. Wall thickness was normal. Systolic function was normal.  ------------------------------------------------------------------- Pulmonic valve:  Doppler: Transvalvular velocity was within the normal range. There was no evidence for stenosis.  ------------------------------------------------------------------- Tricuspid valve:  Structurally normal valve.  Doppler: Transvalvular velocity was within the normal range. There was no regurgitation.  ------------------------------------------------------------------- Pulmonary artery:  The main pulmonary artery was normal-sized. Systolic pressure was within the normal  range.  ------------------------------------------------------------------- Right atrium: The atrium was normal in size.  ------------------------------------------------------------------- Pericardium: There was no pericardial effusion.  ------------------------------------------------------------------- Systemic veins: Inferior vena cava: The vessel was normal in size.  ------------------------------------------------------------------- Measurements  Left ventricle               Value    Reference LV ID, ED, PLAX chordal  51  mm   43 - 52 LV ID, ES, PLAX chordal           38  mm   23 - 38 LV fx shortening, PLAX chordal   (L)   25  %   >=29 LV PW thickness, ED             13  mm   --------- IVS/LV PW ratio, ED             0.77     <=1.3 LV e&', lateral               21.1 cm/s  --------- LV E/e&', lateral              5.12     --------- LV e&', medial                17.4 cm/s  --------- LV E/e&', medial               6.21     --------- LV e&', average               19.25 cm/s  --------- LV E/e&', average              5.61     ---------  Ventricular septum             Value    Reference IVS thickness, ED              10  mm   ---------  Aorta                    Value    Reference Aortic root ID, ED             29  mm   ---------  Left atrium                 Value    Reference LA ID, A-P, ES               43  mm   --------- LA ID/bsa, A-P               2.07 cm/m^2 <=2.2 LA volume, S                67.5 ml   --------- LA volume/bsa, S              32.5 ml/m^2 --------- LA volume, ES, 1-p A4C            65.2 ml   --------- LA volume/bsa, ES, 1-p A4C         31.4 ml/m^2 --------- LA volume, ES, 1-p A2C           69.2 ml   --------- LA volume/bsa, ES, 1-p A2C         33.4 ml/m^2 ---------  Mitral valve                Value    Reference Mitral E-wave peak velocity         108  cm/s  --------- Mitral A-wave peak velocity         75.3 cm/s  --------- Mitral deceleration time          162  ms   150 - 230 Mitral peak gradient, D           5   mm Hg --------- Mitral E/A ratio, peak  1.4     --------- Mitral maximal regurg velocity,       584  cm/s  --------- PISA Mitral regurg VTI, PISA           171  cm   ---------  Systemic veins               Value    Reference Estimated CVP                3   mm Hg ---------  Legend: (L) and (H) mark values outside specified reference range.  ------------------------------------------------------------------- Prepared and Electronically Authenticated by  Armanda Magic, MD 2016-02-07T13:14:00  History of Present Illness: Patient is a 54 year old AA female with history of HTN and DM2 last seen by me in 2014 who was lost to followup due to personal and insurance reasons who presented to the ER with flash pulmonary edema and HTN urgency  Hospital Course: [place] was admitted by my partner Dr. Timothy Lasso with [facility] to the cardiac unit and was initiated on a NTG drip due to flash pulmonary edema.  Given Lasix IV in the ER as well.  Had considerable improvement with her breathing over the course of the next day and was transferred to the telemetry floor.  Cardiology consultation occurred at admission as well.  It was noted that she had been using meds last prescribed well over a year ago, and with questioning, she had  "rationed" her meds but not made any attempt to come back to the office to be seen.  Her A1C was in the high 8 range per labs as well.  During the above event, her renal indices jumped from 0.9 at admission to the mid 2's.  ACE inhibitor was held (although she was not likely taking it).  Converted from Labetalol to Coreg and Amlodipine was added as well.  Echo showed at 35% EF with inferiorbasal hypokinesis.  Subsequent nuclear scan showed irreversible areas.  Decision not to pursue cardiac cath was based on her poor renal function acutely.  She remained on IV heparin during this time as well.  As she had clinically improved but was still somewhat hypertensive, her amlodipine was increased prior to discharge.  She was also instructed on the use of insulin as she is not capable of taking metformin and certainly not TZD's given her above diagnoses.  She is being discharged home in stable condition with close followup early next week on her HTN and renal function planned.  Day of Discharge Exam BP 150/87 mmHg  Pulse 76  Temp(Src) 99 F (37.2 C) (Oral)  Resp 18  Ht 5\' 2"  (1.575 m)  Wt 93.532 kg (206 lb 3.2 oz)  BMI 37.71 kg/m2  SpO2 94%  LMP  (LMP Unknown)  Physical Exam: General appearance: alert, cooperative and appears stated age Neck: no adenopathy, no carotid bruit, no JVD, supple, symmetrical, trachea midline and thyroid not enlarged, symmetric, no tenderness/mass/nodules Resp: clear to auscultation bilaterally Cardio: regular rate and rhythm, S1, S2 normal, no murmur, click, rub or gallop GI: soft, non-tender; bowel sounds normal; no masses, no organomegaly Extremities: extremities normal, atraumatic, no cyanosis or edema Skin: Skin color, texture, turgor normal. No rashes or lesions Neurologic: Grossly normal  Discharge Labs:  Recent Labs  08/18/14 0913 08/20/14 0522  NA 141 137  K 4.0 4.3  CL 109 108  CO2 25 23  GLUCOSE 129* 127*  BUN 35* 25*  CREATININE 2.45* 1.78*   CALCIUM 9.0  8.9    Recent Labs  08/18/14 0529 08/20/14 0522  WBC 8.0 7.5  HGB 9.9* 10.0*  HCT 30.1* 31.0*  MCV 86.0 85.6  PLT 202 213   LDL cholesterol 149. Total 224, HDL 51, TG 122 TSH 1.122 MRSA screen negative A1C 8.8%  Discharge instructions:  Take insulin as prescribed, do not miss followup next week   Disposition: Home  Follow-up Appts: Follow-up with Dr. Wylene Simmer at [facility]  early next week.  Call for appointment.  Condition on Discharge: Stable  Tests Needing Follow-up: BMET early next week, repeat BP check and adjustment of meds, to possibly include metformin and ARB if renal indices have normalized.  Signed: Gaspar Garbe 08/20/2014, 8:10 AM

## 2014-08-20 NOTE — Progress Notes (Signed)
DC IV and tele per MD orders and protocol; DC instructions reviewed with patient and family at bedside; no further questions from patient or family.  Avangeline Stockburger M, RN   

## 2014-08-21 LAB — CATECHOLAMINES, FRACTIONATED, PLASMA
Norepinephrine: 444 pg/mL
Total Catecholamines (Nor+Epi): 444 pg/mL

## 2014-09-01 ENCOUNTER — Ambulatory Visit (HOSPITAL_COMMUNITY)
Admission: RE | Admit: 2014-09-01 | Discharge: 2014-09-01 | Disposition: A | Discharge status: Home / Self Care | Payer: Managed Care, Other (non HMO) | Source: Ambulatory Visit | Attending: Cardiology | Admitting: Cardiology

## 2014-09-01 VITALS — BP 146/82 | HR 78 | Wt 202.5 lb

## 2014-09-01 DIAGNOSIS — I129 Hypertensive chronic kidney disease with stage 1 through stage 4 chronic kidney disease, or unspecified chronic kidney disease: Secondary | ICD-10-CM | POA: Diagnosis not present

## 2014-09-01 DIAGNOSIS — Z7982 Long term (current) use of aspirin: Secondary | ICD-10-CM | POA: Diagnosis not present

## 2014-09-01 DIAGNOSIS — N189 Chronic kidney disease, unspecified: Secondary | ICD-10-CM | POA: Insufficient documentation

## 2014-09-01 DIAGNOSIS — E119 Type 2 diabetes mellitus without complications: Secondary | ICD-10-CM | POA: Diagnosis not present

## 2014-09-01 DIAGNOSIS — E785 Hyperlipidemia, unspecified: Secondary | ICD-10-CM | POA: Diagnosis not present

## 2014-09-01 DIAGNOSIS — I5022 Chronic systolic (congestive) heart failure: Secondary | ICD-10-CM | POA: Insufficient documentation

## 2014-09-01 DIAGNOSIS — I5041 Acute combined systolic (congestive) and diastolic (congestive) heart failure: Secondary | ICD-10-CM

## 2014-09-01 DIAGNOSIS — I701 Atherosclerosis of renal artery: Secondary | ICD-10-CM | POA: Insufficient documentation

## 2014-09-01 DIAGNOSIS — Z79899 Other long term (current) drug therapy: Secondary | ICD-10-CM | POA: Insufficient documentation

## 2014-09-01 DIAGNOSIS — I10 Essential (primary) hypertension: Secondary | ICD-10-CM

## 2014-09-01 MED ORDER — ISOSORB DINITRATE-HYDRALAZINE 20-37.5 MG PO TABS: 1.0000 | ORAL_TABLET | Freq: Three times a day (TID) | ORAL | Status: DC

## 2014-09-01 MED ORDER — FUROSEMIDE 40 MG PO TABS: 40.0000 mg | ORAL_TABLET | Freq: Every day | ORAL | Status: DC

## 2014-09-01 NOTE — Progress Notes (Signed)
Patient ID: [name], female   DOB: 1961/04/30, 54 y.o.   MRN: [id number] PCP: Dr. Wylene Simmer  54 yo with poorly controlled HTN and CHF presents for cardiology followup.  She has had HTN and diabetes for years.  She has had somewhat sporadic medical care due to economic issues (out of work).  She had been on BP meds in the past but had been rationing them due to finances.  She was admitted to [facility] in 2/16 with a hypertensive emergency and flash pulmonary edema. She had been off irbesartan for months.  She was initially given IV Lasix (just one dose) and started back on irbesartan.  Creatinine rose from 0.95 at admission to 2.71.  Irbesartan and Lasix were stopped.  Echo was done, showing EF 35-40% with basal inferior akinesis and mild to moderate MR.  Troponin was mildly elevated to peak 0.35.  Due to elevated creatinine, she did not have cardiac cath.  Lexiscan Cardiolite was done, showing EF 35% but no ischemia or infarction.  V/Q scan was normal.  BP was controlled and she was sent home.  Given AKI, no Lasix was prescribed.   Patient reports ongoing dyspnea.  She is short of breath walking from bedroom to bathroom.  She has 4-pillow orthopnea.  She has a dry cough.  No chest pain, no lightheadedness.  SBP running in the 140s-150s at home.  She is taking her meds. She has bendopnea.   ECG: NSR, nonspecific T wave flattening, normal QRS  Labs (2/16): K 4.3, creatinine 0.95 => 2.71 => 1.78, TnI 0.35, Hgb 10, LDL 149  PMH: 1. HTN: For > 18 yrs, poor control historically.  2. Type II diabetes. 3. Chronic systolic CHF: Admitted 2/16 with hypertensive emergency and pulmonary edema.  Echo (2/16) with EF 35-40%, basal inferior akinesis, mild-moderate MR.  Lexiscan Cardiolite (2/16) with EF 35%, no ischemia or infarction.  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   4. Hyperlipidemia 5. AKI/CKD: AKI in 2/16 in the setting of ARB and diuresis.   SH: Married, unemployed, nonsmoker, no ETOH.   FH:  Diabetes and HTN in multiple family members.  Grandfather MI at 60.  Mother with CHF, no history of MI.  ROS: All systems reviewed and negative except as per HPI.   Current Outpatient Prescriptions  Medication Sig Dispense Refill  . amLODipine (NORVASC) 5 MG tablet Take 2 tablets (10 mg total) by mouth daily. 30 tablet 2  . aspirin EC 81 MG EC tablet Take 1 tablet (81 mg total) by mouth daily.    Marland Kitchen atorvastatin (LIPITOR) 20 MG tablet Take 1 tablet (20 mg total) by mouth daily at 6 PM. 30 tablet 2  . carvedilol (COREG) 12.5 MG tablet Take 1 tablet (12.5 mg total) by mouth 2 (two) times daily with a meal. 60 tablet 2  . furosemide (LASIX) 40 MG tablet Take 1 tablet (40 mg total) by mouth daily. 30 tablet 3  . isosorbide-hydrALAZINE (BIDIL) 20-37.5 MG per tablet Take 1 tablet by mouth 3 (three) times daily. 90 tablet 3   No current facility-administered medications for this encounter.   BP 146/82 mmHg  Pulse 78  Wt 202 lb 8 oz (91.853 kg)  SpO2 98%  LMP  (LMP Unknown) General: NAD Neck: JVP 10-12 cm, no thyromegaly or thyroid nodule.  Lungs: Clear to auscultation bilaterally with normal respiratory effort. CV: Nondisplaced PMI.  Heart regular S1/S2, +S4, no murmur.  No peripheral edema.  No carotid bruit.  Normal pedal pulses.  Abdomen:  Soft, nontender, no hepatosplenomegaly, no distention.  Skin: Intact without lesions or rashes.  Neurologic: Alert and oriented x 3.  Psych: Normal affect. Extremities: No clubbing or cyanosis.  HEENT: Normal.   Assessment/Plan: 1. Chronic systolic CHF: EF 38-46% by echo.  No ischemia or infarction on Cardiolite.  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  This may be due to years of poorly controlled HTN.  She is volume overloaded on exam with NYHA class III symptoms.  - Needs good BP control. - Add Lasix 40 mg daily, will need to follow creatinine closely.   - Continue current Coreg. - Add Bidil 1 tab tid.  - Repeat echo in 6  months or so after BP is controlled.  2. HTN: BP poorly controlled still.  As above, imperative to control.  Will add Bidil 1 tab tid.  She will get renal artery dopplers to look for bilateral renal artery stenosis with flash pulmonary edema.  3. CKD: Creatinine rose rapidly during last admission.  Suspect some baseline renal damage from long-standing HTN and diabetes.   - Repeat BMET in 10 days after starting Lasix.  - I will refer to nephrology.   [name] 09/01/2014 4:30 PM

## 2014-09-01 NOTE — Patient Instructions (Signed)
START Bidil one pill three times per day START Lasix 40 mg one tab daily  Your physician recommends that you schedule a follow-up appointment in: 10 days with labs(BMET,BNP)  You have been referred to [facility], they will contact you for an appointment  Do the following things EVERYDAY: 1) Weigh yourself in the morning before breakfast. Write it down and keep it in a log. 2) Take your medicines as prescribed 3) Eat low salt foods-Limit salt (sodium) to 2000 mg per day.  4) Stay as active as you can everyday 5) Limit all fluids for the day to less than 2 liters 6)

## 2014-09-02 MED ORDER — ISOSORB DINITRATE-HYDRALAZINE 20-37.5 MG PO TABS: 1.0000 | ORAL_TABLET | Freq: Three times a day (TID) | ORAL | Status: DC

## 2014-09-02 NOTE — Addendum Note (Signed)
Encounter addended by: Theresia Bough, CMA on: 09/02/2014 11:21 AM<BR>     Documentation filed: [employer], Orders

## 2014-09-03 ENCOUNTER — Ambulatory Visit (HOSPITAL_COMMUNITY): Payer: Managed Care, Other (non HMO) | Attending: Cardiology | Admitting: Cardiology

## 2014-09-03 DIAGNOSIS — E119 Type 2 diabetes mellitus without complications: Secondary | ICD-10-CM | POA: Insufficient documentation

## 2014-09-03 DIAGNOSIS — N289 Disorder of kidney and ureter, unspecified: Secondary | ICD-10-CM | POA: Insufficient documentation

## 2014-09-03 DIAGNOSIS — N179 Acute kidney failure, unspecified: Secondary | ICD-10-CM

## 2014-09-03 DIAGNOSIS — I10 Essential (primary) hypertension: Secondary | ICD-10-CM | POA: Diagnosis present

## 2014-09-03 DIAGNOSIS — N189 Chronic kidney disease, unspecified: Secondary | ICD-10-CM

## 2014-09-03 NOTE — Progress Notes (Signed)
Renal artery duplex 

## 2014-09-10 ENCOUNTER — Encounter (HOSPITAL_COMMUNITY): Payer: Managed Care, Other (non HMO)

## 2014-09-11 ENCOUNTER — Encounter (HOSPITAL_COMMUNITY): Payer: Self-pay

## 2014-09-11 ENCOUNTER — Ambulatory Visit (HOSPITAL_COMMUNITY)
Admission: RE | Admit: 2014-09-11 | Discharge: 2014-09-11 | Disposition: A | Discharge status: Home / Self Care | Payer: Managed Care, Other (non HMO) | Source: Ambulatory Visit | Attending: Cardiology | Admitting: Cardiology

## 2014-09-11 VITALS — BP 152/86 | HR 70 | Wt 200.5 lb

## 2014-09-11 DIAGNOSIS — E119 Type 2 diabetes mellitus without complications: Secondary | ICD-10-CM | POA: Diagnosis not present

## 2014-09-11 DIAGNOSIS — N183 Chronic kidney disease, stage 3 (moderate): Secondary | ICD-10-CM

## 2014-09-11 DIAGNOSIS — I5022 Chronic systolic (congestive) heart failure: Secondary | ICD-10-CM | POA: Insufficient documentation

## 2014-09-11 DIAGNOSIS — Z7982 Long term (current) use of aspirin: Secondary | ICD-10-CM | POA: Insufficient documentation

## 2014-09-11 DIAGNOSIS — Z79899 Other long term (current) drug therapy: Secondary | ICD-10-CM | POA: Diagnosis not present

## 2014-09-11 DIAGNOSIS — I10 Essential (primary) hypertension: Secondary | ICD-10-CM | POA: Diagnosis not present

## 2014-09-11 DIAGNOSIS — E785 Hyperlipidemia, unspecified: Secondary | ICD-10-CM | POA: Diagnosis not present

## 2014-09-11 DIAGNOSIS — Z9189 Other specified personal risk factors, not elsewhere classified: Secondary | ICD-10-CM

## 2014-09-11 LAB — BASIC METABOLIC PANEL
Anion gap: 10 (ref 5–15)
BUN: 18 mg/dL (ref 6–23)
CO2: 29 mmol/L (ref 19–32)
Calcium: 9.8 mg/dL (ref 8.4–10.5)
Chloride: 101 mmol/L (ref 96–112)
Creatinine, Ser: 1.11 mg/dL — ABNORMAL HIGH (ref 0.50–1.10)
GFR calc Af Amer: 65 mL/min — ABNORMAL LOW (ref >90)
GFR calc non Af Amer: 56 mL/min — ABNORMAL LOW (ref >90)
Glucose, Bld: 148 mg/dL — ABNORMAL HIGH (ref 70–99)
Potassium: 4 mmol/L (ref 3.5–5.1)
Sodium: 140 mmol/L (ref 135–145)

## 2014-09-11 MED ORDER — AMLODIPINE BESYLATE 10 MG PO TABS: 10.0000 mg | ORAL_TABLET | Freq: Every day | ORAL | Status: DC

## 2014-09-11 NOTE — Patient Instructions (Addendum)
Increase Amlodipine to 10 mg daily  Labs today  Your physician has recommended that you have a sleep study. This test records several body functions during sleep, including: brain activity, eye movement, oxygen and carbon dioxide blood levels, heart rate and rhythm, breathing rate and rhythm, the flow of air through your mouth and nose, snoring, body muscle movements, and chest and belly movement.  We have provided you an order for a 24 hour urine, please take the prescription to a [facility] or [facility] and they will provide you with a container and instructions  Your physician discussed the importance of regular exercise and recommended that you start or continue a regular exercise program for good health.  Dr Shirlee Latch recommends that you walk 10-15 minutes daily  Your physician recommends that you schedule a follow-up appointment in: 1 month

## 2014-09-12 NOTE — Addendum Note (Signed)
Encounter addended by: Laurey Morale, MD on: 09/12/2014 12:40 AM<BR>     Documentation filed: Notes Section

## 2014-09-12 NOTE — Progress Notes (Addendum)
Patient ID: Misty Miller, female   DOB: [birth year], 54 y.o.   MRN: [id number] PCP: Dr. Wylene Simmer  54 yo with poorly controlled HTN and CHF presents for cardiology followup.  She has had HTN and diabetes for years.  She has had somewhat sporadic medical care due to economic issues (out of work).  She had been on BP meds in the past but had been rationing them due to finances.  She was admitted to [facility] in 2/16 with a hypertensive emergency and flash pulmonary edema. She had been off irbesartan for months.  She was initially given IV Lasix (just one dose) and started back on irbesartan.  Creatinine rose from 0.95 at admission to 2.71.  Irbesartan and Lasix were stopped.  Echo was done, showing EF 35-40% with basal inferior akinesis and mild to moderate MR.  Troponin was mildly elevated to peak 0.35.  Due to elevated creatinine, she did not have cardiac cath.  Lexiscan Cardiolite was done, showing EF 35% but no ischemia or infarction.  V/Q scan was normal.  BP was controlled and she was sent home.  Given AKI, no Lasix was prescribed.   At last appointment, I restarted her on Lasix given volume overload and adjusted her BP meds.  SBP remains 140s-150s at home.  Weight is down 2 lbs.  She gets too dizzy if she takes Bidil tid (can only tolerate it bid).  She remains short of breath walking about 50-100 feet.  She still has 4-pillow orthopnea. No chest pain, no lightheadedness. She has bendopnea. She snores and has daytime sleepiness. Trying to watch sodium intake.   Labs (2/16): K 4.3, creatinine 0.95 => 2.71 => 1.78, TnI 0.35, Hgb 10, LDL 149, HCT 31  PMH: 1. HTN: For > 18 yrs, poor control historically. Renal artery dopplers showed no renal artery stenosis (2/16).   2. Type II diabetes. 3. Chronic systolic CHF: Admitted 2/16 with hypertensive emergency and pulmonary edema.  Echo (2/16) with EF 35-40%, basal inferior akinesis, mild-moderate MR.  Lexiscan Cardiolite (2/16) with EF 35%, no ischemia or  infarction.  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   4. Hyperlipidemia 5. AKI/CKD: AKI in 2/16 in the setting of ARB and diuresis.   SH: Married, unemployed, nonsmoker, no ETOH.   FH: Diabetes and HTN in multiple family members.  Grandfather MI at 41.  Mother with CHF, no history of MI.  ROS: All systems reviewed and negative except as per HPI.   Current Outpatient Prescriptions  Medication Sig Dispense Refill  . amLODipine (NORVASC) 10 MG tablet Take 1 tablet (10 mg total) by mouth daily. 30 tablet 3  . aspirin EC 81 MG EC tablet Take 1 tablet (81 mg total) by mouth daily.    Marland Kitchen atorvastatin (LIPITOR) 20 MG tablet Take 1 tablet (20 mg total) by mouth daily at 6 PM. (Patient taking differently: Take 20 mg by mouth daily at 6 PM. ) 30 tablet 2  . carvedilol (COREG) 12.5 MG tablet Take 1 tablet (12.5 mg total) by mouth 2 (two) times daily with a meal. 60 tablet 2  . furosemide (LASIX) 40 MG tablet Take 1 tablet (40 mg total) by mouth daily. 30 tablet 3  . isosorbide-hydrALAZINE (BIDIL) 20-37.5 MG per tablet Take 1 tablet by mouth 3 (three) times daily. 90 tablet 3   No current facility-administered medications for this encounter.   BP 152/86 mmHg  Pulse 70  Wt 200 lb 8 oz (90.946 kg)  SpO2 97%  LMP  (LMP Unknown)  General: NAD Neck: JVP 8 cm, no thyromegaly or thyroid nodule.  Lungs: Clear to auscultation bilaterally with normal respiratory effort. CV: Nondisplaced PMI.  Heart regular S1/S2, +S4, no murmur.  No peripheral edema.  No carotid bruit.  Normal pedal pulses.  Abdomen: Soft, nontender, no hepatosplenomegaly, no distention.  Skin: Intact without lesions or rashes.  Neurologic: Alert and oriented x 3.  Psych: Normal affect. Extremities: No clubbing or cyanosis.  HEENT: Normal.   Assessment/Plan: 1. Chronic systolic CHF: EF 72-62% by echo.  No ischemia or infarction on Cardiolite.  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  This may be due to years of  poorly controlled HTN.  Volume looks better now that she is back on Lasix, but symptoms still NYHA class III. - Needs good BP control. - Continue current Lasix.  BMET today.  - Continue current Coreg and bid Bidil (cannot tolerate tid). - No ACEI while creatinine is elevated - Repeat echo in 6 months or so after BP is controlled.  2. HTN: BP poorly controlled still.  Renal artery dopplers normal.  As above, imperative to control.   - Continue Coreg and Bidil. - Increase amlodipine to 10 mg daily.  - Check serum renin and aldosterone.  24 hour urine collection for catecholeamines and metanephrines.   - She has daytime sleepiness and snores. OSA can worsen HTN.  I will arrange for sleep study.  3. CKD: Creatinine rose rapidly during last admission.  Suspect some baseline renal damage from long-standing HTN and diabetes.   - BMET today.   - I referred her to nephrology.   Followup in 1 month.   [name] 09/12/2014

## 2014-09-15 ENCOUNTER — Telehealth (HOSPITAL_COMMUNITY): Payer: Self-pay | Admitting: Vascular Surgery

## 2014-09-15 NOTE — Telephone Encounter (Signed)
Attempted to call pt back, VM is not set up

## 2014-09-15 NOTE — Telephone Encounter (Signed)
Pt has an order for 24 hr urine test, she was told not to take her medicine for 3 days, she wants to know if that's ok with [facility]

## 2014-09-15 NOTE — Telephone Encounter (Signed)
Per Dr Shirlee Latch pt does not need to hold any meds for 24 hour urine, she is aware

## 2014-09-15 NOTE — Telephone Encounter (Signed)
Pt called she has an order for a 24 hour urine test , she was told not take her medicine for 3 days she wants to know if the doctor is all right with that.. Please advise

## 2014-09-16 ENCOUNTER — Telehealth (HOSPITAL_COMMUNITY): Payer: Self-pay | Admitting: *Deleted

## 2014-09-16 NOTE — Telephone Encounter (Signed)
Received fax from [facility], pt is sch to see Dr Hyman Hopes on 3/15 at 4:15

## 2014-09-17 LAB — ALDOSTERONE: Aldosterone, Serum: 1 ng/dL

## 2014-09-18 LAB — RENIN: Renin Activity: 0.15 ng/mL/h — ABNORMAL LOW (ref 0.25–5.82)

## 2014-10-13 ENCOUNTER — Encounter (HOSPITAL_COMMUNITY): Payer: Self-pay

## 2014-10-13 ENCOUNTER — Ambulatory Visit (HOSPITAL_COMMUNITY)
Admission: RE | Admit: 2014-10-13 | Discharge: 2014-10-13 | Disposition: A | Discharge status: Home / Self Care | Payer: Managed Care, Other (non HMO) | Source: Ambulatory Visit | Attending: Cardiology | Admitting: Cardiology

## 2014-10-13 VITALS — BP 180/100 | HR 73 | Wt 207.5 lb

## 2014-10-13 DIAGNOSIS — E119 Type 2 diabetes mellitus without complications: Secondary | ICD-10-CM | POA: Diagnosis not present

## 2014-10-13 DIAGNOSIS — I129 Hypertensive chronic kidney disease with stage 1 through stage 4 chronic kidney disease, or unspecified chronic kidney disease: Secondary | ICD-10-CM | POA: Diagnosis not present

## 2014-10-13 DIAGNOSIS — N183 Chronic kidney disease, stage 3 (moderate): Secondary | ICD-10-CM

## 2014-10-13 DIAGNOSIS — I5022 Chronic systolic (congestive) heart failure: Secondary | ICD-10-CM | POA: Insufficient documentation

## 2014-10-13 DIAGNOSIS — E785 Hyperlipidemia, unspecified: Secondary | ICD-10-CM | POA: Diagnosis not present

## 2014-10-13 DIAGNOSIS — G4733 Obstructive sleep apnea (adult) (pediatric): Secondary | ICD-10-CM | POA: Insufficient documentation

## 2014-10-13 DIAGNOSIS — I10 Essential (primary) hypertension: Secondary | ICD-10-CM

## 2014-10-13 DIAGNOSIS — R002 Palpitations: Secondary | ICD-10-CM | POA: Diagnosis not present

## 2014-10-13 DIAGNOSIS — N189 Chronic kidney disease, unspecified: Secondary | ICD-10-CM | POA: Insufficient documentation

## 2014-10-13 MED ORDER — CARVEDILOL 12.5 MG PO TABS: 18.7500 mg | ORAL_TABLET | Freq: Two times a day (BID) | ORAL | Status: DC

## 2014-10-13 MED ORDER — SPIRONOLACTONE 25 MG PO TABS: 12.5000 mg | ORAL_TABLET | Freq: Every day | ORAL | Status: DC

## 2014-10-13 NOTE — Patient Instructions (Signed)
INCREASE Carvedilol (Coreg) to 18.75mg  (1.5 tablets) twice daily.  START Spironolactone 12.5mg  (1/2 tablet) once daily.  Return in 10 days for lab work.  Follow up in 2 weeks for routine appointment.  Will schedule you to have a 48 hour holter (heart) monitor.  Do the following things EVERYDAY: 1) Weigh yourself in the morning before breakfast. Write it down and keep it in a log. 2) Take your medicines as prescribed 3) Eat low salt foods-Limit salt (sodium) to 2000 mg per day.  4) Stay as active as you can everyday 5) Limit all fluids for the day to less than 2 liters

## 2014-10-13 NOTE — Progress Notes (Signed)
Patient ID: Misty Miller, female   DOB: 1961/04/11, 54 y.o.   MRN: [id number] PCP: Dr. Wylene Simmer  54 yo with poorly controlled HTN and CHF presents for cardiology followup.  She has had HTN and diabetes for years.  She has had somewhat sporadic medical care due to economic issues (out of work).  She had been on BP meds in the past but had been rationing them due to finances.  She was admitted to [facility] in 2/16 with a hypertensive emergency and flash pulmonary edema. She had been off irbesartan for months.  She was initially given IV Lasix (just one dose) and started back on irbesartan.  Creatinine rose from 0.95 at admission to 2.71.  Irbesartan and Lasix were stopped.  Echo was done, showing EF 35-40% with basal inferior akinesis and mild to moderate MR.  Troponin was mildly elevated to peak 0.35.  Due to elevated creatinine, she did not have cardiac cath.  Lexiscan Cardiolite was done, showing EF 35% but no ischemia or infarction.  V/Q scan was normal.  BP was controlled and she was sent home.  Creatinine has improved and she is now on Lasix.    Breathing is better now, she can walk about 400 feet before getting short of breath.  No exertional chest discomfort.  She does get chest heaviness at random times associated with a sensation of rapid heart beat.  She gets too dizzy if she takes Bidil tid (can only tolerate it bid).   No lightheadedness. She has bendopnea. She snores and has daytime sleepiness. Trying to watch sodium intake. She has seen nephrology.  BP high in the office today, says she took her meds.  SBP running in 150s when she checks at home.   Labs (2/16): K 4.3, creatinine 0.95 => 2.71 => 1.78, TnI 0.35, Hgb 10, LDL 149, HCT 31, TSH normal, plasma aldosterone 1, urine catecholeamines normal.  Labs (3/16): K 4, creatinine 1.11  PMH: 1. HTN: For > 18 yrs, poor control historically. Renal artery dopplers showed no renal artery stenosis (2/16).   2. Type II diabetes. 3. Chronic systolic  CHF: Admitted 2/16 with hypertensive emergency and pulmonary edema.  Echo (2/16) with EF 35-40%, basal inferior akinesis, mild-moderate MR.  Lexiscan Cardiolite (2/16) with EF 35%, no ischemia or infarction.  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   4. Hyperlipidemia 5. AKI/CKD: AKI in 2/16 in the setting of ARB and diuresis.   SH: Married, unemployed, nonsmoker, no ETOH.   FH: Diabetes and HTN in multiple family members.  Grandfather MI at 54.  Mother with CHF, no history of MI.  ROS: All systems reviewed and negative except as per HPI.   Current Outpatient Prescriptions  Medication Sig Dispense Refill  . amLODipine (NORVASC) 10 MG tablet Take 1 tablet (10 mg total) by mouth daily. 30 tablet 3  . aspirin EC 81 MG EC tablet Take 1 tablet (81 mg total) by mouth daily.    Marland Kitchen atorvastatin (LIPITOR) 20 MG tablet Take 1 tablet (20 mg total) by mouth daily at 6 PM. (Patient taking differently: Take 20 mg by mouth daily at 6 PM. ) 30 tablet 2  . carvedilol (COREG) 12.5 MG tablet Take 1.5 tablets (18.75 mg total) by mouth 2 (two) times daily with a meal. 90 tablet 3  . furosemide (LASIX) 40 MG tablet Take 1 tablet (40 mg total) by mouth daily. 30 tablet 3  . isosorbide-hydrALAZINE (BIDIL) 20-37.5 MG per tablet Take 1 tablet by mouth 3 (three) times  daily. (Patient taking differently: Take 1 tablet by mouth 2 (two) times daily. ) 90 tablet 3  . spironolactone (ALDACTONE) 25 MG tablet Take 0.5 tablets (12.5 mg total) by mouth daily. 45 tablet 3   No current facility-administered medications for this encounter.   BP 180/100 mmHg  Pulse 73  Wt 207 lb 8 oz (94.121 kg)  SpO2 98%  LMP  (LMP Unknown) General: NAD Neck: JVP 7 cm, no thyromegaly or thyroid nodule.  Lungs: Clear to auscultation bilaterally with normal respiratory effort. CV: Nondisplaced PMI.  Heart regular S1/S2, +S4, no murmur.  No peripheral edema.  No carotid bruit.  Normal pedal pulses.  Abdomen: Soft, nontender, no  hepatosplenomegaly, no distention.  Skin: Intact without lesions or rashes.  Neurologic: Alert and oriented x 3.  Psych: Normal affect. Extremities: No clubbing or cyanosis.  HEENT: Normal.   Assessment/Plan: 1. Chronic systolic CHF: EF 44-03% by echo.  No ischemia or infarction on Cardiolite.  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  This may be due to years of poorly controlled HTN.  Consider catheterization in future if renal function remains improved and EF does not improve with BP control.  Volume looks better now that she is back on Lasix, but symptoms still NYHA class II-III. - Needs good BP control. - Continue current Lasix.  - Continue current bid Bidil (cannot tolerate tid). - Add spironolactone 12.5 mg daily with BMET in 10 days.  - Increase Coreg to 18.75 mg bid.  - No ACEI for now with recent AKI.  - Repeat echo in 6 months or so after BP is controlled.  2. HTN: BP poorly controlled still.  Renal artery dopplers normal.  Urinary catecholeamines negative, screen for hyperaldosteronism negative.  As above, imperative to control BP.   - Increase Coreg and add spironolactone as above.  - Continue amlodipine and Bidil.   - She has daytime sleepiness and snores. OSA can worsen HTN.  Sleep study scheduled for 5/16.  - She will write down BP readings and bring them with her to next appt.  3. CKD: Creatinine rose rapidly during last admission.  Suspect some baseline renal damage from long-standing HTN and diabetes.  Creatinine much improved.  Repeat BMET after starting spironolactone.  Still holding off on ACEI.  She has seen nephrology.  4. Chest heaviness: Not associated with exertion.  Random onset, associated with palpitations and happens on most days.  I will arrange 48 hour holter to assess for arrhythmias.   Followup in 2 wks  [name] 10/13/2014

## 2014-10-16 ENCOUNTER — Encounter (INDEPENDENT_AMBULATORY_CARE_PROVIDER_SITE_OTHER): Payer: Managed Care, Other (non HMO)

## 2014-10-16 ENCOUNTER — Encounter: Payer: Self-pay | Admitting: *Deleted

## 2014-10-16 ENCOUNTER — Encounter: Payer: Self-pay | Admitting: Cardiology

## 2014-10-16 DIAGNOSIS — R002 Palpitations: Secondary | ICD-10-CM | POA: Diagnosis not present

## 2014-10-16 NOTE — Progress Notes (Signed)
Patient ID: Misty Miller, female   DOB: 1961/02/06, 54 y.o.   MRN: [id number] [facility] 48 hour holter monitor applied to patient.

## 2014-10-21 ENCOUNTER — Encounter (HOSPITAL_COMMUNITY): Payer: Self-pay

## 2014-10-21 NOTE — Progress Notes (Signed)
Disability Paperwork received via mail from [facility] [employer]ability [employer] requesting "all records from 09/09/2014 to present. Case # [id number] Records available in Epic by our office faxed to given fax number of [phone] Copy of paperwork faxed into patient's chart for future reference.  Ave Filter

## 2014-10-22 ENCOUNTER — Ambulatory Visit (HOSPITAL_COMMUNITY)
Admission: RE | Admit: 2014-10-22 | Discharge: 2014-10-22 | Disposition: A | Discharge status: Home / Self Care | Payer: Managed Care, Other (non HMO) | Source: Ambulatory Visit | Attending: Internal Medicine | Admitting: Internal Medicine

## 2014-10-22 DIAGNOSIS — I5022 Chronic systolic (congestive) heart failure: Secondary | ICD-10-CM | POA: Insufficient documentation

## 2014-10-22 LAB — BASIC METABOLIC PANEL
Anion gap: 8 (ref 5–15)
BUN: 22 mg/dL (ref 6–23)
CO2: 29 mmol/L (ref 19–32)
Calcium: 9.3 mg/dL (ref 8.4–10.5)
Chloride: 98 mmol/L (ref 96–112)
Creatinine, Ser: 1.09 mg/dL (ref 0.50–1.10)
GFR calc Af Amer: 66 mL/min — ABNORMAL LOW (ref >90)
GFR calc non Af Amer: 57 mL/min — ABNORMAL LOW (ref >90)
Glucose, Bld: 398 mg/dL — ABNORMAL HIGH (ref 70–99)
Potassium: 3.9 mmol/L (ref 3.5–5.1)
Sodium: 135 mmol/L (ref 135–145)

## 2014-10-27 ENCOUNTER — Ambulatory Visit (HOSPITAL_COMMUNITY)
Admission: RE | Admit: 2014-10-27 | Discharge: 2014-10-27 | Disposition: A | Discharge status: Home / Self Care | Payer: Managed Care, Other (non HMO) | Source: Ambulatory Visit | Attending: Cardiology | Admitting: Cardiology

## 2014-10-27 VITALS — BP 138/78 | HR 80 | Wt 203.5 lb

## 2014-10-27 DIAGNOSIS — G4733 Obstructive sleep apnea (adult) (pediatric): Secondary | ICD-10-CM | POA: Insufficient documentation

## 2014-10-27 DIAGNOSIS — N183 Chronic kidney disease, stage 3 (moderate): Secondary | ICD-10-CM | POA: Diagnosis not present

## 2014-10-27 DIAGNOSIS — I5022 Chronic systolic (congestive) heart failure: Secondary | ICD-10-CM | POA: Diagnosis not present

## 2014-10-27 DIAGNOSIS — I10 Essential (primary) hypertension: Secondary | ICD-10-CM | POA: Diagnosis not present

## 2014-10-27 DIAGNOSIS — Z79899 Other long term (current) drug therapy: Secondary | ICD-10-CM | POA: Diagnosis not present

## 2014-10-27 DIAGNOSIS — E785 Hyperlipidemia, unspecified: Secondary | ICD-10-CM | POA: Insufficient documentation

## 2014-10-27 DIAGNOSIS — E119 Type 2 diabetes mellitus without complications: Secondary | ICD-10-CM | POA: Insufficient documentation

## 2014-10-27 DIAGNOSIS — Z7982 Long term (current) use of aspirin: Secondary | ICD-10-CM | POA: Diagnosis not present

## 2014-10-27 DIAGNOSIS — R002 Palpitations: Secondary | ICD-10-CM | POA: Insufficient documentation

## 2014-10-27 MED ORDER — SPIRONOLACTONE 25 MG PO TABS: 25.0000 mg | ORAL_TABLET | Freq: Every day | ORAL | Status: DC

## 2014-10-27 MED ORDER — ISOSORB DINITRATE-HYDRALAZINE 20-37.5 MG PO TABS: 1.0000 | ORAL_TABLET | Freq: Three times a day (TID) | ORAL | Status: DC

## 2014-10-27 NOTE — Progress Notes (Signed)
Patient ID: Misty Miller, female   DOB: 1961/02/12, 54 y.o.   MRN: [id number] PCP: Dr. Wylene Simmer  54 yo with poorly controlled HTN and CHF presents for cardiology followup.  She has had HTN and diabetes for years.  She has had somewhat sporadic medical care due to economic issues (out of work).  She had been on BP meds in the past but had been rationing them due to finances.  She was admitted to [facility] in 2/16 with a hypertensive emergency and flash pulmonary edema. She had been off irbesartan for months.  She was initially given IV Lasix (just one dose) and started back on irbesartan.  Creatinine rose from 0.95 at admission to 2.71.  Irbesartan and Lasix were stopped.  Echo was done, showing EF 35-40% with basal inferior akinesis and mild to moderate MR.  Troponin was mildly elevated to peak 0.35.  Due to elevated creatinine, she did not have cardiac cath.  Lexiscan Cardiolite was done, showing EF 35% but no ischemia or infarction.  V/Q scan was normal.  BP was controlled and she was sent home.  Creatinine has improved and she is now on Lasix.    Breathing is better now, she can walk on flat ground without dyspnea.  No exertional chest discomfort.  No orthopnea/PND. She snores and has daytime sleepiness. Trying to watch sodium intake. She has seen nephrology.  BP doing better but still too high, SBP 140s-170s at home.  She has been getting palpitations ("fluttering sensation") periodically.  Weight is down 4 lbs.   Labs (2/16): K 4.3, creatinine 0.95 => 2.71 => 1.78, TnI 0.35, Hgb 10, LDL 149, HCT 31, TSH normal, plasma aldosterone 1, urine catecholeamines normal.  Labs (3/16): K 4, creatinine 1.11 Labs (4/16): K 3.9, creatinine 1.09  PMH: 1. HTN: For > 18 yrs, poor control historically. Renal artery dopplers showed no renal artery stenosis (2/16).   2. Type II diabetes. 3. Chronic systolic CHF: Admitted 2/16 with hypertensive emergency and pulmonary edema.  Echo (2/16) with EF 35-40%, basal  inferior akinesis, mild-moderate MR.  Lexiscan Cardiolite (2/16) with EF 35%, no ischemia or infarction.  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   4. Hyperlipidemia 5. AKI/CKD: AKI in 2/16 in the setting of ARB and diuresis.   SH: Married, unemployed, nonsmoker, no ETOH.   FH: Diabetes and HTN in multiple family members.  Grandfather MI at 28.  Mother with CHF, no history of MI.  ROS: All systems reviewed and negative except as per HPI.   Current Outpatient Prescriptions  Medication Sig Dispense Refill  . amLODipine (NORVASC) 10 MG tablet Take 1 tablet (10 mg total) by mouth daily. 30 tablet 3  . aspirin EC 81 MG EC tablet Take 1 tablet (81 mg total) by mouth daily.    Marland Kitchen atorvastatin (LIPITOR) 20 MG tablet Take 1 tablet (20 mg total) by mouth daily at 6 PM. 30 tablet 2  . carvedilol (COREG) 12.5 MG tablet Take 1.5 tablets (18.75 mg total) by mouth 2 (two) times daily with a meal. 90 tablet 3  . furosemide (LASIX) 40 MG tablet Take 1 tablet (40 mg total) by mouth daily. 30 tablet 3  . isosorbide-hydrALAZINE (BIDIL) 20-37.5 MG per tablet Take 1 tablet by mouth 3 (three) times daily. 90 tablet 3  . spironolactone (ALDACTONE) 25 MG tablet Take 1 tablet (25 mg total) by mouth daily. 90 tablet 3   No current facility-administered medications for this encounter.   BP 138/78 mmHg  Pulse 80  Wt 203 lb 8 oz (92.307 kg)  SpO2 97%  LMP  (LMP Unknown) General: NAD Neck: JVP 7 cm, no thyromegaly or thyroid nodule.  Lungs: Clear to auscultation bilaterally with normal respiratory effort. CV: Nondisplaced PMI.  Heart regular S1/S2, +S4, no murmur.  No peripheral edema.  No carotid bruit.  Normal pedal pulses.  Abdomen: Soft, nontender, no hepatosplenomegaly, no distention.  Skin: Intact without lesions or rashes.  Neurologic: Alert and oriented x 3.  Psych: Normal affect. Extremities: No clubbing or cyanosis.  HEENT: Normal.   Assessment/Plan: 1. Chronic systolic CHF: EF 16-10% by echo.  No  ischemia or infarction on Cardiolite.  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  This may be due to years of poorly controlled HTN.  Consider catheterization in future if renal function remains improved and EF does not improve with BP control.  Volume looks better now that she is back on Lasix and symptoms improved, NYHA class II. - Needs good BP control. - Continue current Lasix.  - She will try again to increase Bidil to tid.  - Increase spironolactone to 25 mg daily with BMET in 7 days.   - Continue current Coreg.  - No ACEI for now with recent AKI.  - Repeat echo in 8/16 after BP is controlled.  2. HTN: BP still not ideal.  Renal artery dopplers normal.  Urinary catecholeamines negative, screen for hyperaldosteronism negative.  As above, imperative to control BP.   - Increase Bidil and spironolactone as above.  - Continue amlodipine and Coreg.   - She has daytime sleepiness and snores. OSA can worsen HTN.  Sleep study scheduled for 5/16.  3. CKD: Creatinine rose rapidly during last admission.  Suspect some baseline renal damage from long-standing HTN and diabetes.  Creatinine much improved.  Repeat BMET in 7 days after increasing spironolactone.  Still holding off on ACEI.  She has seen nephrology.  4. Palpitations: Holter turned in, awaiting print out.   Followup in 3 wks  [name] 10/27/2014

## 2014-10-27 NOTE — Patient Instructions (Signed)
Increase Spironolactone to 25 mg daily, prescription sent to [facility]  Increase Bidil to 1 tab Three times a day, prescription sent to [facility] in 1 week (bmet)  Your physician recommends that you schedule a follow-up appointment in: 3 weeks

## 2014-11-11 ENCOUNTER — Telehealth (HOSPITAL_COMMUNITY): Payer: Self-pay

## 2014-11-11 NOTE — Telephone Encounter (Signed)
Patient made aware that her disability paperwork for [employer] ready to be picked up and completed by herself and her employer before mailing to company.  Patient states she will pick this up tomorrow morning.  Chantel CMA made aware that paperwork will be placed in disability files in our clinic when patient retrieves it tomorrow.  Ave Filter

## 2014-11-17 ENCOUNTER — Encounter (HOSPITAL_COMMUNITY): Payer: Self-pay | Admitting: *Deleted

## 2014-11-17 ENCOUNTER — Ambulatory Visit (HOSPITAL_COMMUNITY)
Admission: RE | Admit: 2014-11-17 | Discharge: 2014-11-17 | Disposition: A | Discharge status: Home / Self Care | Payer: Managed Care, Other (non HMO) | Source: Ambulatory Visit | Attending: Internal Medicine | Admitting: Internal Medicine

## 2014-11-17 ENCOUNTER — Encounter (HOSPITAL_COMMUNITY): Payer: Self-pay

## 2014-11-17 VITALS — BP 136/74 | HR 82 | Wt 205.0 lb

## 2014-11-17 DIAGNOSIS — N189 Chronic kidney disease, unspecified: Secondary | ICD-10-CM | POA: Insufficient documentation

## 2014-11-17 DIAGNOSIS — I10 Essential (primary) hypertension: Secondary | ICD-10-CM

## 2014-11-17 DIAGNOSIS — I5022 Chronic systolic (congestive) heart failure: Secondary | ICD-10-CM | POA: Diagnosis not present

## 2014-11-17 DIAGNOSIS — R002 Palpitations: Secondary | ICD-10-CM | POA: Insufficient documentation

## 2014-11-17 MED ORDER — CARVEDILOL 25 MG PO TABS: 25.0000 mg | ORAL_TABLET | Freq: Two times a day (BID) | ORAL | Status: DC

## 2014-11-17 NOTE — Progress Notes (Signed)
Patient ID: Misty Miller, female   DOB: [birth year], 54 y.o.   MRN: [id number] PCP: Dr. Wylene Simmer  54 yo with poorly controlled HTN and CHF presents for cardiology followup.  She has had HTN and diabetes for years.  She has had somewhat sporadic medical care due to economic issues (out of work).  She had been on BP meds in the past but had been rationing them due to finances.  She was admitted to [facility] in 2/16 with a hypertensive emergency and flash pulmonary edema. She had been off irbesartan for months.  She was initially given IV Lasix (just one dose) and started back on irbesartan.  Creatinine rose from 0.95 at admission to 2.71.  Irbesartan and Lasix were stopped.  Echo was done, showing EF 35-40% with basal inferior akinesis and mild to moderate MR.  Troponin was mildly elevated to peak 0.35.  Due to elevated creatinine, she did not have cardiac cath.  Lexiscan Cardiolite was done, showing EF 35% but no ischemia or infarction.  V/Q scan was normal.  BP was controlled and she was sent home.  Creatinine has improved and she is now on Lasix.    Breathing is better now, she can walk on flat ground without dyspnea.  She is walking on a track for exercise.  No exertional chest discomfort.  No orthopnea/PND. She snores and has daytime sleepiness, has sleep study tomorrow. Trying to watch sodium intake. She has seen nephrology.  BP doing better but still too high, SBP 130s-150s at home.  She has been getting palpitations ("fluttering sensation") periodically.  This is better on higher dose of Coreg.  Holter done, waiting for result.    Labs (2/16): K 4.3, creatinine 0.95 => 2.71 => 1.78, TnI 0.35, Hgb 10, LDL 149, HCT 31, TSH normal, plasma aldosterone 1, urine catecholeamines normal.  Labs (3/16): K 4, creatinine 1.11 Labs (4/16): K 3.9, creatinine 1.09  PMH: 1. HTN: For > 18 yrs, poor control historically. Renal artery dopplers showed no renal artery stenosis (2/16).   2. Type II diabetes. 3.  Chronic systolic CHF: Admitted 2/16 with hypertensive emergency and pulmonary edema.  Echo (2/16) with EF 35-40%, basal inferior akinesis, mild-moderate MR.  Lexiscan Cardiolite (2/16) with EF 35%, no ischemia or infarction.  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   4. Hyperlipidemia 5. AKI/CKD: AKI in 2/16 in the setting of ARB and diuresis.   SH: Married, unemployed, nonsmoker, no ETOH.   FH: Diabetes and HTN in multiple family members.  Grandfather MI at 27.  Mother with CHF, no history of MI.  ROS: All systems reviewed and negative except as per HPI.   Current Outpatient Prescriptions  Medication Sig Dispense Refill  . amLODipine (NORVASC) 10 MG tablet Take 1 tablet (10 mg total) by mouth daily. 30 tablet 3  . aspirin EC 81 MG EC tablet Take 1 tablet (81 mg total) by mouth daily.    Marland Kitchen atorvastatin (LIPITOR) 20 MG tablet Take 1 tablet (20 mg total) by mouth daily at 6 PM. 30 tablet 2  . carvedilol (COREG) 25 MG tablet Take 1 tablet (25 mg total) by mouth 2 (two) times daily with a meal. 60 tablet 3  . furosemide (LASIX) 40 MG tablet Take 1 tablet (40 mg total) by mouth daily. 30 tablet 3  . isosorbide-hydrALAZINE (BIDIL) 20-37.5 MG per tablet Take 1 tablet by mouth 3 (three) times daily. 90 tablet 3  . spironolactone (ALDACTONE) 25 MG tablet Take 1 tablet (25 mg total) by  mouth daily. 90 tablet 3   No current facility-administered medications for this encounter.   BP 136/74 mmHg  Pulse 82  Wt 205 lb (92.987 kg)  SpO2 97%  LMP  (LMP Unknown) General: NAD Neck: JVP 7 cm, no thyromegaly or thyroid nodule.  Lungs: Clear to auscultation bilaterally with normal respiratory effort. CV: Nondisplaced PMI.  Heart regular S1/S2, +S4, no murmur.  No peripheral edema.  No carotid bruit.  Normal pedal pulses.  Abdomen: Soft, nontender, no hepatosplenomegaly, no distention.  Skin: Intact without lesions or rashes.  Neurologic: Alert and oriented x 3.  Psych: Normal affect. Extremities: No  clubbing or cyanosis.  HEENT: Normal.   Assessment/Plan: 1. Chronic systolic CHF: EF 38-32% by echo.  No ischemia or infarction on Cardiolite.  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  This may be due to years of poorly controlled HTN.  Consider catheterization in future if renal function remains improved and EF does not improve with BP control.  Volume looks better now that she is back on Lasix and symptoms improved, NYHA class II. - Needs good BP control. - Continue current Lasix.  - Now tolerating Bidil 1 tab tid.  - Continue spironolactone.    - Increase Coreg to 25 mg bid.   - No ACEI for now with recent AKI.  - Repeat echo in 8/16 after BP is controlled.  2. HTN: BP still not ideal.  Renal artery dopplers normal.  Urinary catecholeamines negative, screen for hyperaldosteronism negative.  As above, imperative to control BP.   - Increase Coreg as above.   - Continue amlodipine, spironolactone, and Bidil.   - She has daytime sleepiness and snores. OSA can worsen HTN.  Sleep study scheduled for tomorrow.  3. CKD: Creatinine rose rapidly during last admission.  Suspect some baseline renal damage from long-standing HTN and diabetes.  Creatinine much improved.  Still holding off on ACEI.  She has seen nephrology.  4. Palpitations: Awaiting holter print out. Palpitations better with increased Coreg.   [name] 11/17/2014

## 2014-11-17 NOTE — Patient Instructions (Signed)
Increase Carvedilol to 25 mg Twice daily   Your physician recommends that you schedule a follow-up appointment in: 1 month

## 2014-11-18 ENCOUNTER — Ambulatory Visit (HOSPITAL_BASED_OUTPATIENT_CLINIC_OR_DEPARTMENT_OTHER): Payer: Managed Care, Other (non HMO) | Attending: Cardiology | Admitting: Radiology

## 2014-11-18 VITALS — Ht 63.0 in | Wt 202.0 lb

## 2014-11-18 DIAGNOSIS — Z9189 Other specified personal risk factors, not elsewhere classified: Secondary | ICD-10-CM

## 2014-11-18 DIAGNOSIS — I493 Ventricular premature depolarization: Secondary | ICD-10-CM | POA: Diagnosis not present

## 2014-11-18 DIAGNOSIS — G4733 Obstructive sleep apnea (adult) (pediatric): Secondary | ICD-10-CM

## 2014-11-18 DIAGNOSIS — R0683 Snoring: Secondary | ICD-10-CM | POA: Insufficient documentation

## 2014-11-18 DIAGNOSIS — G4719 Other hypersomnia: Secondary | ICD-10-CM | POA: Diagnosis present

## 2014-11-19 ENCOUNTER — Telehealth: Payer: Self-pay | Admitting: Cardiology

## 2014-11-19 NOTE — Addendum Note (Signed)
Addended by: Armanda Magic R on: 11/19/2014 10:36 AM   Modules accepted: Orders

## 2014-11-19 NOTE — Sleep Study (Signed)
NAME: [place] DATE OF BIRTH:  1961-07-03 MEDICAL RECORD NUMBER [id number]  LOCATION: [facility]  PHYSICIAN: TURNER,TRACI R  DATE OF STUDY: 11/18/2014  SLEEP STUDY TYPE: Split Night Nocturnal Polysomnogram with CPAP titration               REFERRING PHYSICIAN: Laurey Morale, MD  INDICATION FOR STUDY: snoring, excessive daytime sleepiness  EPWORTH SLEEPINESS SCORE: 13 HEIGHT:  (160 cm)  WEIGHT: 202 lb (91.627 kg)    Body mass index is 35.79 kg/(m^2).  NECK SIZE: 15.25 in.  MEDICATIONS: Reviewed in the chart  SLEEP ARCHITECTURE: During the diagnostic portion of the study, the patient slept for a total of 122 minutes out of a total sleep time of 132 minutes.  There was no slow wave sleep and 38 minutes of REM sleep.  The onset to sleep latency was delayed at 80 minutes and onset to REM sleep latency was normal at 84 minutes.  The sleep efficiency was reduced at 58%.  During the CPAP titration, the patient slept for a total of 213 minutes out of a total sleep time of 225 minutes.  There was no slow wave sleep and 28 minutes of REM sleep.  The onset to REM sleep latency was normal at 115 minutes.  The sleep efficiency was normal at 94%.  RESPIRATORY DATA: During the diagnostic portion of the study, there were 28 obstructive apnea.  There were 28 hypopneas.  Most events occurred during REM sleep in the non supine position.  The total AHI was 27.4 events per hour consistent with moderate obstructive sleep apnea/hypopnea syndrome.  There was moderate snoring noted.  The patient was started on CPAP at 5cm H2O and titrated for respiratory events and snoring to 20cm H2O.  The patient was able to achieve REM sleep at optimum pressure but unable to maintain the supine position.  The AHI at an optimum pressure of 14cm H2O was 4.3 events per hour.  Snoring was eliminated with CPAP.  OXYGEN DATA: During the diagnostic portion of the study, the lowest oxygen saturation  was 75%.  The average oxygen saturation was 75%.  The time spent with oxygen saturations <88% was 2.5 minutes.  During the CPAP titration, the average oxygen saturation was 95%.  The lowest oxygen saturation was 85%.  The time spent with oxygen saturations <88% was 0.1 minutes.    CARDIAC DATA: The patient maintained NSR with PAC's and PVC's during the study.  The average heart rate was 64-70bpm.  The lowest heart rate was 31 bpm and the highest heart rate was 187 bpm.  MOVEMENT/PARASOMNIA: There were no periodic limb movements or REM sleep behavior disorders noted during sleep.  IMPRESSION/ RECOMMENDATION:   1.  Moderate obstructive sleep apnea/hypopnea syndrome with an AHI of 27.4 events per hour.   Most events occurred during REM sleep in the non supine position. 2.  Reduced sleep efficiency with increased frequency of arousals due to respiratory events.   3.  Oxygen desaturations to 75% related to respiratory events.  During the CPAP titration, the time spent with oxygen saturations <88% was 0.1 minutes.   4.  Abnormal sleep architecture with no slow wave sleep and reduced REM sleep. 5.  There were PAC's and PVC's noted during the study.  6.  Moderate snoring was noted that resolved with CPAP. 7.  Successful CPAP titration to 14cm H2O.  The patient was able to achieve REM sleep at optimum pressure but unable to  maintain the supine position.  The AHI at an optimum pressure of 14cm H2O was 4.3 events per hour 8.  Recommend ResMed CPAP at 14cm H2O with heated humidifier and medium [employer] Simplus full face mask. 9. The patient should be counseled in good sleep hygiene and weight loss.  Signed: Quintella Reichert Diplomate, American Board of Sleep Medicine  ELECTRONICALLY SIGNED ON:  11/19/2014, 10:19 AM [facility] PH: [phone]   FX: [phone] ACCREDITED BY THE AMERICAN ACADEMY OF SLEEP MEDICINE

## 2014-11-19 NOTE — Telephone Encounter (Signed)
Please let patient know that they have sleep apnea and had successful PAP titration. Please setup appointment in 10 weeks. Please let [facility] know that order for PAP is in [facility].   

## 2014-11-20 NOTE — Telephone Encounter (Signed)
Left message to call back  

## 2014-11-21 NOTE — Telephone Encounter (Signed)
Left message to call back  

## 2014-11-24 NOTE — Telephone Encounter (Signed)
Left message to call back  

## 2014-11-25 NOTE — Telephone Encounter (Signed)
Pt is aware of results and stated verbal understanding. Follow-Up Appt made for 02/16/15 @ 8:15am.     [facility] Notified

## 2014-12-18 ENCOUNTER — Ambulatory Visit (HOSPITAL_COMMUNITY)
Admission: RE | Admit: 2014-12-18 | Discharge: 2014-12-18 | Disposition: A | Discharge status: Home / Self Care | Payer: Managed Care, Other (non HMO) | Source: Ambulatory Visit | Attending: Internal Medicine | Admitting: Internal Medicine

## 2014-12-18 ENCOUNTER — Encounter (HOSPITAL_COMMUNITY): Payer: Self-pay | Admitting: *Deleted

## 2014-12-18 ENCOUNTER — Encounter (HOSPITAL_COMMUNITY): Payer: Self-pay

## 2014-12-18 VITALS — BP 170/108 | HR 72 | Wt 201.2 lb

## 2014-12-18 DIAGNOSIS — R079 Chest pain, unspecified: Secondary | ICD-10-CM

## 2014-12-18 DIAGNOSIS — I5022 Chronic systolic (congestive) heart failure: Secondary | ICD-10-CM | POA: Diagnosis not present

## 2014-12-18 DIAGNOSIS — R9431 Abnormal electrocardiogram [ECG] [EKG]: Secondary | ICD-10-CM | POA: Insufficient documentation

## 2014-12-18 DIAGNOSIS — I10 Essential (primary) hypertension: Secondary | ICD-10-CM | POA: Diagnosis not present

## 2014-12-18 DIAGNOSIS — R0989 Other specified symptoms and signs involving the circulatory and respiratory systems: Secondary | ICD-10-CM | POA: Diagnosis present

## 2014-12-18 MED ORDER — ISOSORB DINITRATE-HYDRALAZINE 20-37.5 MG PO TABS: 2.0000 | ORAL_TABLET | Freq: Two times a day (BID) | ORAL | Status: DC

## 2014-12-18 NOTE — Patient Instructions (Signed)
Your provider has requested you have a Right Heart Cath Performed Tuesday June 14th 2016 at 11am.  INCREASE Bidil to 2 tablets twice a day.  FOLLOW UP in 2 weeks.

## 2014-12-18 NOTE — Progress Notes (Signed)
Patient ID: Misty Miller, female   DOB: 11/21/1960, 54 y.o.   MRN: [id number] PCP: Dr. Tisovec  54 yo with poorly controlled HTN and CHF presents for cardiology followup.  She has had HTN and diabetes for years.  She has had somewhat sporadic medical care due to economic issues (out of work).  She had been on BP meds in the past but had been rationing them due to finances.  She was admitted to [facility] in 2/16 with a hypertensive emergency and flash pulmonary edema. She had been off irbesartan for months.  She was initially given IV Lasix (just one dose) and started back on irbesartan.  Creatinine rose from 0.95 at admission to 2.71.  Irbesartan and Lasix were stopped.  Echo was done, showing EF 35-40% with basal inferior akinesis and mild to moderate MR.  Troponin was mildly elevated to peak 0.35.  Due to elevated creatinine, she did not have cardiac cath.  Lexiscan Cardiolite was done, showing EF 35% but no ischemia or infarction.  V/Q scan was normal.  BP was controlled and she was sent home.  Creatinine has improved and she is now on Lasix.    She returns for HF follow up. Last visit coreg was increased to 25 mg twice a day. Has intermittent chest heaviness at rest and with exertion. She has noticed when she performs activities. Chest heaviness not reproducible. Taking all medications. S BP at home 140-150. Not currently working    Labs (2/16): K 4.3, creatinine 0.95 => 2.71 => 1.78, TnI 0.35, Hgb 10, LDL 149, HCT 31, TSH normal, plasma aldosterone 1, urine catecholeamines normal.  Labs (3/16): K 4, creatinine 1.11 Labs (4/16): K 3.9, creatinine 1.09  PMH: 1. HTN: For > 18 yrs, poor control historically. Renal artery dopplers showed no renal artery stenosis (2/16).   2. Type II diabetes. 3. Chronic systolic CHF: Admitted 2/16 with hypertensive emergency and pulmonary edema.  Echo (2/16) with EF 35-40%, basal inferior akinesis, mild-moderate MR.  Lexiscan Cardiolite (2/16) with EF 35%, no  ischemia or infarction.  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   4. Hyperlipidemia 5. AKI/CKD: AKI in 2/16 in the setting of ARB and diuresis.   SH: Married, unemployed, nonsmoker, no ETOH.   FH: Diabetes and HTN in multiple family members.  Grandfather MI at 57.  Mother with CHF, no history of MI.  ROS: All systems reviewed and negative except as per HPI.   Current Outpatient Prescriptions  Medication Sig Dispense Refill  . amLODipine (NORVASC) 10 MG tablet Take 1 tablet (10 mg total) by mouth daily. 30 tablet 3  . aspirin EC 81 MG EC tablet Take 1 tablet (81 mg total) by mouth daily.    . atorvastatin (LIPITOR) 20 MG tablet Take 1 tablet (20 mg total) by mouth daily at 6 PM. 30 tablet 2  . carvedilol (COREG) 25 MG tablet Take 1 tablet (25 mg total) by mouth 2 (two) times daily with a meal. 60 tablet 3  . furosemide (LASIX) 40 MG tablet Take 1 tablet (40 mg total) by mouth daily. 30 tablet 3  . isosorbide-hydrALAZINE (BIDIL) 20-37.5 MG per tablet Take 1 tablet by mouth 3 (three) times daily. 90 tablet 3  . spironolactone (ALDACTONE) 25 MG tablet Take 1 tablet (25 mg total) by mouth daily. 90 tablet 3   No current facility-administered medications for this encounter.   BP 170/108 mmHg  Pulse 72  Wt 201 lb 4 oz (91.286 kg)  SpO2 98%  LMP  (  LMP Unknown) General: NAD Neck: JVP 7 cm, no thyromegaly or thyroid nodule.  Lungs: Clear to auscultation bilaterally with normal respiratory effort. CV: Nondisplaced PMI.  Heart regular S1/S2, +S4, no murmur.  No peripheral edema.  No carotid bruit.  Normal pedal pulses.  Abdomen: Soft, nontender, no hepatosplenomegaly, no distention.  Skin: Intact without lesions or rashes.  Neurologic: Alert and oriented x 3.  Psych: Normal affect. Extremities: No clubbing or cyanosis.  HEENT: Normal.   Assessment/Plan: 1. Chronic systolic CHF: EF 35-40% by echo.  No ischemia or infarction on Cardiolite.  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  This may be due to years of poorly controlled HTN.  Consider catheterization in future if renal function remains improved and EF does not improve with BP control.  Volume looks better now that she is back on Lasix and symptoms improved, NYHA class II. - Continue current Lasix.  - Increase Bidil 2 tablets three times a day.   - Continue spironolactone.    - Continue Coreg to 25 mg bid.   - Consider Entresto after cath  2. HTN: BP elevated.  Renal artery dopplers normal.  Urinary catecholeamines negative, screen for hyperaldosteronism negative.  As above, imperative to control BP.   -Increase Bidil to 2 tabs tid - Continue current dose of coreg, amlodipine, and spironolactone. .   - She has daytime sleepiness and snores. OSA can worsen HTN.  Sleep study scheduled for tomorrow.  3. CKD: Creatinine rose rapidly during last admission.  Suspect some baseline renal damage from long-standing HTN and diabetes.  Creatinine much improved.  Still holding off on ACEI.  She has seen nephrology.  4. Palpitations: Awaiting holter print out.  5. Chest Heaviness. - Set up for [facility] next week.   CLEGG,AMY NP-C 12/18/2014  Patient seen and examined with Amy Clegg, NP. We discussed all aspects of the encounter. I agree with the assessment and plan as stated above.   Doing well from a HF standpoint. Agree with increasing Bidil. CP persists. Given CRFs have suggested cardiac cath now that renal function normal. Will arrange with Dr. Mclean next week. I have reviewed the risks, indications, and alternatives to angioplasty and stenting with the patient. Risks include but are not limited to bleeding, infection, vascular injury, stroke, myocardial infection, arrhythmia, kidney injury, radiation-related injury in the case of prolonged fluoroscopy use, emergency cardiac surgery, and death. The patient understands the risks of serious complication is low (<1%) and he agrees to proceed.   Bensimhon,  Daniel,MD 8:54 PM                                                                                                                                                                                                                                                                                                                                                  

## 2014-12-23 ENCOUNTER — Encounter (HOSPITAL_COMMUNITY): Payer: Self-pay | Admitting: General Practice

## 2014-12-23 ENCOUNTER — Ambulatory Visit (HOSPITAL_COMMUNITY)
Admission: RE | Admit: 2014-12-23 | Discharge: 2014-12-24 | Disposition: A | Discharge status: Home / Self Care | Payer: Managed Care, Other (non HMO) | Source: Ambulatory Visit | Attending: Cardiology | Admitting: Cardiology

## 2014-12-23 ENCOUNTER — Encounter (HOSPITAL_COMMUNITY)
Admission: RE | Disposition: A | Discharge status: Home / Self Care | Payer: Managed Care, Other (non HMO) | Source: Ambulatory Visit | Attending: Cardiology

## 2014-12-23 DIAGNOSIS — E1165 Type 2 diabetes mellitus with hyperglycemia: Secondary | ICD-10-CM | POA: Insufficient documentation

## 2014-12-23 DIAGNOSIS — N189 Chronic kidney disease, unspecified: Secondary | ICD-10-CM | POA: Diagnosis not present

## 2014-12-23 DIAGNOSIS — I129 Hypertensive chronic kidney disease with stage 1 through stage 4 chronic kidney disease, or unspecified chronic kidney disease: Secondary | ICD-10-CM | POA: Insufficient documentation

## 2014-12-23 DIAGNOSIS — Z7982 Long term (current) use of aspirin: Secondary | ICD-10-CM | POA: Diagnosis not present

## 2014-12-23 DIAGNOSIS — I10 Essential (primary) hypertension: Secondary | ICD-10-CM | POA: Diagnosis not present

## 2014-12-23 DIAGNOSIS — Z955 Presence of coronary angioplasty implant and graft: Secondary | ICD-10-CM

## 2014-12-23 DIAGNOSIS — I5022 Chronic systolic (congestive) heart failure: Secondary | ICD-10-CM | POA: Diagnosis not present

## 2014-12-23 DIAGNOSIS — I2510 Atherosclerotic heart disease of native coronary artery without angina pectoris: Secondary | ICD-10-CM | POA: Diagnosis not present

## 2014-12-23 DIAGNOSIS — I429 Cardiomyopathy, unspecified: Secondary | ICD-10-CM | POA: Diagnosis not present

## 2014-12-23 DIAGNOSIS — E785 Hyperlipidemia, unspecified: Secondary | ICD-10-CM | POA: Diagnosis not present

## 2014-12-23 DIAGNOSIS — IMO0002 Reserved for concepts with insufficient information to code with codable children: Secondary | ICD-10-CM

## 2014-12-23 HISTORY — DX: Family history of other specified conditions: Z84.89

## 2014-12-23 HISTORY — DX: Personal history of other medical treatment: Z92.89

## 2014-12-23 HISTORY — DX: Atherosclerotic heart disease of native coronary artery without angina pectoris: I25.10

## 2014-12-23 HISTORY — DX: Acute myocardial infarction, unspecified: I21.9

## 2014-12-23 HISTORY — PX: CARDIAC CATHETERIZATION: SHX172

## 2014-12-23 HISTORY — DX: Sleep apnea, unspecified: G47.30

## 2014-12-23 HISTORY — DX: Type 2 diabetes mellitus without complications: E11.9

## 2014-12-23 HISTORY — DX: Chronic kidney disease, unspecified: N18.9

## 2014-12-23 HISTORY — DX: Hyperlipidemia, unspecified: E78.5

## 2014-12-23 HISTORY — DX: Heart failure, unspecified: I50.9

## 2014-12-23 HISTORY — PX: CORONARY ANGIOPLASTY WITH STENT PLACEMENT: SHX49

## 2014-12-23 LAB — BASIC METABOLIC PANEL
Anion gap: 7 (ref 5–15)
BUN: 19 mg/dL (ref 6–20)
CO2: 30 mmol/L (ref 22–32)
Calcium: 9.5 mg/dL (ref 8.9–10.3)
Chloride: 101 mmol/L (ref 101–111)
Creatinine, Ser: 0.88 mg/dL (ref 0.44–1.00)
GFR calc Af Amer: >60 mL/min (ref >60)
GFR calc non Af Amer: >60 mL/min (ref >60)
Glucose, Bld: 235 mg/dL — ABNORMAL HIGH (ref 65–99)
Potassium: 5.1 mmol/L (ref 3.5–5.1)
Sodium: 138 mmol/L (ref 135–145)

## 2014-12-23 LAB — CBC
HCT: 36.5 % (ref 36.0–46.0)
Hemoglobin: 12.3 g/dL (ref 12.0–15.0)
MCH: 28.2 pg (ref 26.0–34.0)
MCHC: 33.7 g/dL (ref 30.0–36.0)
MCV: 83.7 fL (ref 78.0–100.0)
Platelets: 271 10*3/uL (ref 150–400)
RBC: 4.36 MIL/uL (ref 3.87–5.11)
RDW: 14.3 % (ref 11.5–15.5)
WBC: 7.3 10*3/uL (ref 4.0–10.5)

## 2014-12-23 LAB — POCT ACTIVATED CLOTTING TIME
Activated Clotting Time: 288 seconds
Activated Clotting Time: 325 seconds

## 2014-12-23 LAB — GLUCOSE, CAPILLARY
Glucose-Capillary: 214 mg/dL — ABNORMAL HIGH (ref 65–99)
Glucose-Capillary: 184 mg/dL — ABNORMAL HIGH (ref 65–99)
Glucose-Capillary: 243 mg/dL — ABNORMAL HIGH (ref 65–99)
Glucose-Capillary: 239 mg/dL — ABNORMAL HIGH (ref 65–99)

## 2014-12-23 LAB — PROTIME-INR
INR: 1.02 (ref 0.00–1.49)
Prothrombin Time: 13.6 seconds (ref 11.6–15.2)

## 2014-12-23 SURGERY — LEFT HEART CATH AND CORONARY ANGIOGRAPHY

## 2014-12-23 MED ORDER — SODIUM CHLORIDE 0.9 % IJ SOLN: 3.0000 mL | INTRAMUSCULAR | Status: DC | PRN

## 2014-12-23 MED ORDER — ONDANSETRON HCL 4 MG/2ML IJ SOLN
4.0000 mg | Freq: Four times a day (QID) | INTRAMUSCULAR | Status: DC | PRN
  Administered 2014-12-23: 4 mg via INTRAVENOUS
  Filled 2014-12-23: qty 2

## 2014-12-23 MED ORDER — ASPIRIN EC 325 MG PO TBEC: 325.0000 mg | DELAYED_RELEASE_TABLET | Freq: Every day | ORAL | Status: DC

## 2014-12-23 MED ORDER — MIDAZOLAM HCL 2 MG/2ML IJ SOLN
INTRAMUSCULAR | Status: DC | PRN
  Administered 2014-12-23: 2 mg via INTRAVENOUS
  Administered 2014-12-23 (×2): 1 mg via INTRAVENOUS

## 2014-12-23 MED ORDER — ISOSORB DINITRATE-HYDRALAZINE 20-37.5 MG PO TABS
2.0000 | ORAL_TABLET | Freq: Two times a day (BID) | ORAL | Status: DC
  Administered 2014-12-23 (×2): 2 via ORAL
  Filled 2014-12-23 (×5): qty 2

## 2014-12-23 MED ORDER — AMLODIPINE BESYLATE 5 MG PO TABS
10.0000 mg | ORAL_TABLET | Freq: Every day | ORAL | Status: DC
  Administered 2014-12-24: 11:00:00 10 mg via ORAL
  Filled 2014-12-23 (×2): qty 2

## 2014-12-23 MED ORDER — IOHEXOL 350 MG/ML SOLN
INTRAVENOUS | Status: DC | PRN
  Administered 2014-12-23: 215 mL via INTRA_ARTERIAL

## 2014-12-23 MED ORDER — CARVEDILOL 12.5 MG PO TABS
25.0000 mg | ORAL_TABLET | Freq: Two times a day (BID) | ORAL | Status: DC
  Administered 2014-12-23 – 2014-12-24 (×2): 25 mg via ORAL
  Filled 2014-12-23 (×3): qty 2

## 2014-12-23 MED ORDER — MIDAZOLAM HCL 2 MG/2ML IJ SOLN
INTRAMUSCULAR | Status: AC
  Filled 2014-12-23: qty 2

## 2014-12-23 MED ORDER — SODIUM CHLORIDE 0.9 % IJ SOLN
3.0000 mL | Freq: Two times a day (BID) | INTRAMUSCULAR | Status: DC
  Administered 2014-12-24: 10:00:00 3 mL via INTRAVENOUS

## 2014-12-23 MED ORDER — CLOPIDOGREL BISULFATE 300 MG PO TABS
ORAL_TABLET | ORAL | Status: DC | PRN
  Administered 2014-12-23: 600 mg via ORAL

## 2014-12-23 MED ORDER — FENTANYL CITRATE (PF) 100 MCG/2ML IJ SOLN
INTRAMUSCULAR | Status: AC
  Filled 2014-12-23: qty 2

## 2014-12-23 MED ORDER — SODIUM CHLORIDE 0.9 % IV SOLN: 250.0000 mL | INTRAVENOUS | Status: DC | PRN

## 2014-12-23 MED ORDER — ACETAMINOPHEN 325 MG PO TABS
650.0000 mg | ORAL_TABLET | ORAL | Status: DC | PRN
  Administered 2014-12-23: 20:00:00 650 mg via ORAL
  Filled 2014-12-23: qty 2

## 2014-12-23 MED ORDER — ADENOSINE 12 MG/4ML IV SOLN
16.0000 mL | Freq: Once | INTRAVENOUS | Status: DC
  Filled 2014-12-23: qty 16

## 2014-12-23 MED ORDER — OXYCODONE-ACETAMINOPHEN 5-325 MG PO TABS: 1.0000 | ORAL_TABLET | ORAL | Status: DC | PRN

## 2014-12-23 MED ORDER — HEPARIN (PORCINE) IN NACL 2-0.9 UNIT/ML-% IJ SOLN
INTRAMUSCULAR | Status: AC
  Filled 2014-12-23: qty 1000

## 2014-12-23 MED ORDER — CLOPIDOGREL BISULFATE 75 MG PO TABS: 75.0000 mg | ORAL_TABLET | Freq: Every day | ORAL | Status: DC

## 2014-12-23 MED ORDER — ATORVASTATIN CALCIUM 80 MG PO TABS
80.0000 mg | ORAL_TABLET | Freq: Every day | ORAL | Status: DC
  Administered 2014-12-23: 80 mg via ORAL
  Filled 2014-12-23: qty 1

## 2014-12-23 MED ORDER — VERAPAMIL HCL 2.5 MG/ML IV SOLN
INTRAVENOUS | Status: AC
  Filled 2014-12-23: qty 2

## 2014-12-23 MED ORDER — FUROSEMIDE 40 MG PO TABS
40.0000 mg | ORAL_TABLET | Freq: Every day | ORAL | Status: DC
  Administered 2014-12-23 – 2014-12-24 (×2): 40 mg via ORAL
  Filled 2014-12-23 (×2): qty 1

## 2014-12-23 MED ORDER — INSULIN ASPART 100 UNIT/ML ~~LOC~~ SOLN
0.0000 [IU] | Freq: Three times a day (TID) | SUBCUTANEOUS | Status: DC
  Administered 2014-12-23: 3 [IU] via SUBCUTANEOUS
  Administered 2014-12-24: 07:00:00 2 [IU] via SUBCUTANEOUS
  Administered 2014-12-24: 3 [IU] via SUBCUTANEOUS

## 2014-12-23 MED ORDER — ASPIRIN 81 MG PO CHEW
81.0000 mg | CHEWABLE_TABLET | ORAL | Status: AC
  Administered 2014-12-23: 81 mg via ORAL

## 2014-12-23 MED ORDER — LIDOCAINE HCL (PF) 1 % IJ SOLN
INTRAMUSCULAR | Status: DC | PRN
  Administered 2014-12-23: 5 mL via SUBCUTANEOUS

## 2014-12-23 MED ORDER — NITROGLYCERIN 1 MG/10 ML FOR IR/CATH LAB
INTRA_ARTERIAL | Status: AC
  Filled 2014-12-23: qty 10

## 2014-12-23 MED ORDER — HEPARIN SODIUM (PORCINE) 1000 UNIT/ML IJ SOLN
INTRAMUSCULAR | Status: DC | PRN
  Administered 2014-12-23: 5000 [IU] via INTRAVENOUS
  Administered 2014-12-23: 2000 [IU] via INTRAVENOUS
  Administered 2014-12-23: 4000 [IU] via INTRAVENOUS

## 2014-12-23 MED ORDER — CLOPIDOGREL BISULFATE 300 MG PO TABS
ORAL_TABLET | ORAL | Status: AC
  Filled 2014-12-23: qty 1

## 2014-12-23 MED ORDER — HEPARIN SODIUM (PORCINE) 1000 UNIT/ML IJ SOLN
INTRAMUSCULAR | Status: AC
  Filled 2014-12-23: qty 1

## 2014-12-23 MED ORDER — NITROGLYCERIN 1 MG/10 ML FOR IR/CATH LAB
INTRA_ARTERIAL | Status: DC | PRN
  Administered 2014-12-23 (×2): 200 ug via INTRA_ARTERIAL

## 2014-12-23 MED ORDER — LIDOCAINE HCL (PF) 1 % IJ SOLN
INTRAMUSCULAR | Status: AC
  Filled 2014-12-23: qty 30

## 2014-12-23 MED ORDER — SODIUM CHLORIDE 0.9 % IJ SOLN: 3.0000 mL | Freq: Two times a day (BID) | INTRAMUSCULAR | Status: DC

## 2014-12-23 MED ORDER — SODIUM CHLORIDE 0.9 % IV SOLN
INTRAVENOUS | Status: DC
  Administered 2014-12-23: 11:00:00 via INTRAVENOUS

## 2014-12-23 MED ORDER — ASPIRIN 81 MG PO CHEW
CHEWABLE_TABLET | ORAL | Status: AC
  Filled 2014-12-23: qty 1

## 2014-12-23 MED ORDER — VERAPAMIL HCL 2.5 MG/ML IV SOLN
INTRAVENOUS | Status: DC | PRN
  Administered 2014-12-23: 13:00:00 via INTRA_ARTERIAL

## 2014-12-23 MED ORDER — ADENOSINE (DIAGNOSTIC) 140MCG/KG/MIN
INTRAVENOUS | Status: DC | PRN
  Administered 2014-12-23: 140 ug/kg/min via INTRAVENOUS

## 2014-12-23 MED ORDER — FENTANYL CITRATE (PF) 100 MCG/2ML IJ SOLN
INTRAMUSCULAR | Status: DC | PRN
  Administered 2014-12-23: 25 ug via INTRAVENOUS
  Administered 2014-12-23: 50 ug via INTRAVENOUS
  Administered 2014-12-23: 25 ug via INTRAVENOUS

## 2014-12-23 MED ORDER — SPIRONOLACTONE 25 MG PO TABS
25.0000 mg | ORAL_TABLET | Freq: Every day | ORAL | Status: DC
  Administered 2014-12-23 – 2014-12-24 (×2): 25 mg via ORAL
  Filled 2014-12-23 (×3): qty 1

## 2014-12-23 MED ORDER — SODIUM CHLORIDE 0.9 % WEIGHT BASED INFUSION: 3.0000 mL/kg/h | INTRAVENOUS | Status: AC

## 2014-12-23 SURGICAL SUPPLY — 21 items
BALLN TREK RX 3.0X12 (BALLOONS) ×2
BALLN ~~LOC~~ EUPHORA RX 3.25X15 (BALLOONS) ×2
BALLOON TREK RX 3.0X12 (BALLOONS) IMPLANT
BALLOON ~~LOC~~ EUPHORA RX 3.25X15 (BALLOONS) IMPLANT
CATH INFINITI 5 FR JL3.5 (CATHETERS) ×2 IMPLANT
CATH INFINITI 5FR ANG PIGTAIL (CATHETERS) ×2 IMPLANT
CATH INFINITI JR4 5F (CATHETERS) ×2 IMPLANT
CATH MICROCATH NAVVUS (MICROCATHETER) IMPLANT
CATH VISTA GUIDE 6FR JR4 (CATHETERS) ×1 IMPLANT
DEVICE RAD COMP TR BAND LRG (VASCULAR PRODUCTS) ×2 IMPLANT
GLIDESHEATH SLEND SS 6F .021 (SHEATH) ×2 IMPLANT
KIT ENCORE 26 ADVANTAGE (KITS) ×1 IMPLANT
KIT HEART LEFT (KITS) ×2 IMPLANT
MICROCATHETER NAVVUS (MICROCATHETER) ×2
PACK CARDIAC CATHETERIZATION (CUSTOM PROCEDURE TRAY) ×2 IMPLANT
STENT PROMUS PREM MR 3.0X20 (Permanent Stent) ×1 IMPLANT
SYR MEDRAD MARK V 150ML (SYRINGE) ×2 IMPLANT
TRANSDUCER W/STOPCOCK (MISCELLANEOUS) ×3 IMPLANT
TUBING CIL FLEX 10 FLL-RA (TUBING) ×2 IMPLANT
WIRE ASAHI PROWATER 180CM (WIRE) ×1 IMPLANT
WIRE SAFE-T 1.5MM-J .035X260CM (WIRE) ×2 IMPLANT

## 2014-12-23 NOTE — Progress Notes (Signed)
Patient Information: 1. Suspected CAD (No Prior PCI, No Prior CABG, and No Prior Angiogram Showing > = 50% Angiographic Stenosis) 2. Prior Noninvasive Testing: Stress Test With Imaging (SPECT MPI, Stress Echocardiography, Stress PET, Stress CMR) 3. Intermediate-risk findings (e.g., 5% to 10% ischemic myocardium on stress SPECT MPI or stress PET, stress-induced wall motion abnormality in a single segment on stress echo or stress CMR) 4. Pretest Symptom Status: Symptomatic A (7) Indication: 16;  Misty Miller 12/23/2014  

## 2014-12-23 NOTE — Interval H&P Note (Signed)
Cath Lab Visit (complete for each Cath Lab visit)  Clinical Evaluation Leading to the Procedure:   ACS: No.  Non-ACS:    Anginal Classification: CCS III  Anti-ischemic medical therapy: Minimal Therapy (1 class of medications)  Non-Invasive Test Results: Intermediate-risk stress test findings: cardiac mortality 1-3%/year  Prior CABG: No previous CABG      History and Physical Interval Note:  12/23/2014 12:46 PM  Misty Miller  has presented today for surgery, with the diagnosis of chf  The various methods of treatment have been discussed with the patient and family. After consideration of risks, benefits and other options for treatment, the patient has consented to  Procedure(s): Right Heart Cath (N/A) as a surgical intervention .  The patient's history has been reviewed, patient examined, no change in status, stable for surgery.  I have reviewed the patient's chart and labs.  Questions were answered to the patient's satisfaction.     Mary Secord [employer]

## 2014-12-23 NOTE — CV Procedure (Signed)
    Cardiac Catheterization Procedure Note  Name: Misty Miller MRN: [id number] DOB: Feb 13, 1961  Procedure: Left Heart Cath, Selective Coronary Angiography, LV angiography  Indication: Patient with cardiomyopathy of uncertain etiology.  EF 35-40%, inferior wall motion abnormality.  Troponin mildly elevated at prior admission for CHF.  Cath not done at that time due to CKD.  Creatinine now improved considerably.  She had a Cardiolite that showed low EF with no perfusion defects.  However, she has been having chest pain at rest and with exertion as well as exertional dyspnea.  Given these factors, I elected to cath her to assess for cause of cardiomyopathy and also to assess for significant coronary disease as a cause of her chest pain.    Procedural Details: The right wrist was prepped, draped, and anesthetized with 1% lidocaine. Using the modified Seldinger technique, a 5/6 French Slender sheath was introduced into the right radial artery. 3 mg of verapamil was administered through the sheath, weight-based unfractionated heparin was administered intravenously. Standard Judkins catheters were used for selective coronary angiography and left ventriculography. Catheter exchanges were performed over an exchange length guidewire. There were no immediate procedural complications. A TR band was used for radial hemostasis at the completion of the procedure.  The patient was transferred to the post catheterization recovery area for further monitoring.  Procedural Findings: Hemodynamics: AO 149/79 LV 147/15  Coronary angiography: Coronary dominance: right  Left mainstem: No significant disease.   Left anterior descending (LAD): Luminal irregularities.   Left circumflex (LCx): Luminal irregularities.   Right coronary artery (RCA): 80-90% discrete proximal RCA stenosis, 50% mid RCA stenosis, 60% distal RCA stenosis.   Left ventriculography: Not done to minimize contrast with history of AKI.    Final Conclusions:  [name] has significant RCA disease with severe proximal stenosis and moderate mid and distal RCA stenosis.  I suspect this is the etiology of her periodic chest pain.  She may have had true ACS at prior admission with mildly elevated troponin. As there was no perfusion defect on Cardiolite, she will have FFR done.  If this suggests significant disease, she will undergo PCI. She is a reasonable candidate for DAPT.  Dr Katrinka Blazing to do FFR and possible PCI.   [name] MD, [facility] 12/23/2014, 1:26 PM

## 2014-12-23 NOTE — Progress Notes (Signed)
TR BAND REMOVAL  LOCATION:    right radial  DEFLATED PER PROTOCOL:    Yes.    TIME BAND OFF / DRESSING APPLIED:    1900   SITE UPON ARRIVAL:    Level 0  SITE AFTER BAND REMOVAL:    Level 0  REVERSE ALLEN'S TEST:     positive  CIRCULATION SENSATION AND MOVEMENT:    Within Normal Limits   Yes.    COMMENTS:   Tolerated procedure well 

## 2014-12-23 NOTE — H&P (View-Only) (Signed)
Patient ID: Misty Miller, female   DOB: 01-Jul-1961, 54 y.o.   MRN: [id number] PCP: Dr. Wylene Simmer  54 yo with poorly controlled HTN and CHF presents for cardiology followup.  She has had HTN and diabetes for years.  She has had somewhat sporadic medical care due to economic issues (out of work).  She had been on BP meds in the past but had been rationing them due to finances.  She was admitted to [facility] in 2/16 with a hypertensive emergency and flash pulmonary edema. She had been off irbesartan for months.  She was initially given IV Lasix (just one dose) and started back on irbesartan.  Creatinine rose from 0.95 at admission to 2.71.  Irbesartan and Lasix were stopped.  Echo was done, showing EF 35-40% with basal inferior akinesis and mild to moderate MR.  Troponin was mildly elevated to peak 0.35.  Due to elevated creatinine, she did not have cardiac cath.  Lexiscan Cardiolite was done, showing EF 35% but no ischemia or infarction.  V/Q scan was normal.  BP was controlled and she was sent home.  Creatinine has improved and she is now on Lasix.    She returns for HF follow up. Last visit coreg was increased to 25 mg twice a day. Has intermittent chest heaviness at rest and with exertion. She has noticed when she performs activities. Chest heaviness not reproducible. Taking all medications. S BP at home 140-150. Not currently working    Labs (2/16): K 4.3, creatinine 0.95 => 2.71 => 1.78, TnI 0.35, Hgb 10, LDL 149, HCT 31, TSH normal, plasma aldosterone 1, urine catecholeamines normal.  Labs (3/16): K 4, creatinine 1.11 Labs (4/16): K 3.9, creatinine 1.09  PMH: 1. HTN: For > 18 yrs, poor control historically. Renal artery dopplers showed no renal artery stenosis (2/16).   2. Type II diabetes. 3. Chronic systolic CHF: Admitted 2/16 with hypertensive emergency and pulmonary edema.  Echo (2/16) with EF 35-40%, basal inferior akinesis, mild-moderate MR.  Lexiscan Cardiolite (2/16) with EF 35%, no  ischemia or infarction.  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   4. Hyperlipidemia 5. AKI/CKD: AKI in 2/16 in the setting of ARB and diuresis.   SH: Married, unemployed, nonsmoker, no ETOH.   FH: Diabetes and HTN in multiple family members.  Grandfather MI at 54.  Mother with CHF, no history of MI.  ROS: All systems reviewed and negative except as per HPI.   Current Outpatient Prescriptions  Medication Sig Dispense Refill  . amLODipine (NORVASC) 10 MG tablet Take 1 tablet (10 mg total) by mouth daily. 30 tablet 3  . aspirin EC 81 MG EC tablet Take 1 tablet (81 mg total) by mouth daily.    Marland Kitchen atorvastatin (LIPITOR) 20 MG tablet Take 1 tablet (20 mg total) by mouth daily at 6 PM. 30 tablet 2  . carvedilol (COREG) 25 MG tablet Take 1 tablet (25 mg total) by mouth 2 (two) times daily with a meal. 60 tablet 3  . furosemide (LASIX) 40 MG tablet Take 1 tablet (40 mg total) by mouth daily. 30 tablet 3  . isosorbide-hydrALAZINE (BIDIL) 20-37.5 MG per tablet Take 1 tablet by mouth 3 (three) times daily. 90 tablet 3  . spironolactone (ALDACTONE) 25 MG tablet Take 1 tablet (25 mg total) by mouth daily. 90 tablet 3   No current facility-administered medications for this encounter.   BP 170/108 mmHg  Pulse 72  Wt 201 lb 4 oz (91.286 kg)  SpO2 98%  LMP  (  LMP Unknown) General: NAD Neck: JVP 7 cm, no thyromegaly or thyroid nodule.  Lungs: Clear to auscultation bilaterally with normal respiratory effort. CV: Nondisplaced PMI.  Heart regular S1/S2, +S4, no murmur.  No peripheral edema.  No carotid bruit.  Normal pedal pulses.  Abdomen: Soft, nontender, no hepatosplenomegaly, no distention.  Skin: Intact without lesions or rashes.  Neurologic: Alert and oriented x 3.  Psych: Normal affect. Extremities: No clubbing or cyanosis.  HEENT: Normal.   Assessment/Plan: 1. Chronic systolic CHF: EF 16-10% by echo.  No ischemia or infarction on Cardiolite.  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  This may be due to years of poorly controlled HTN.  Consider catheterization in future if renal function remains improved and EF does not improve with BP control.  Volume looks better now that she is back on Lasix and symptoms improved, NYHA class II. - Continue current Lasix.  - Increase Bidil 2 tablets three times a day.   - Continue spironolactone.    - Continue Coreg to 25 mg bid.   - Consider Entresto after cath  2. HTN: BP elevated.  Renal artery dopplers normal.  Urinary catecholeamines negative, screen for hyperaldosteronism negative.  As above, imperative to control BP.   -Increase Bidil to 2 tabs tid - Continue current dose of coreg, amlodipine, and spironolactone. .   - She has daytime sleepiness and snores. OSA can worsen HTN.  Sleep study scheduled for tomorrow.  3. CKD: Creatinine rose rapidly during last admission.  Suspect some baseline renal damage from long-standing HTN and diabetes.  Creatinine much improved.  Still holding off on ACEI.  She has seen nephrology.  4. Palpitations: Awaiting holter print out.  5. Chest Heaviness. - Set up for LHC next week.   CLEGG,AMY NP-C 12/18/2014  Patient seen and examined with Tonye Becket, NP. We discussed all aspects of the encounter. I agree with the assessment and plan as stated above.   Doing well from a HF standpoint. Agree with increasing Bidil. CP persists. Given CRFs have suggested cardiac cath now that renal function normal. Will arrange with Dr. Shirlee Latch next week. I have reviewed the risks, indications, and alternatives to angioplasty and stenting with the patient. Risks include but are not limited to bleeding, infection, vascular injury, stroke, myocardial infection, arrhythmia, kidney injury, radiation-related injury in the case of prolonged fluoroscopy use, emergency cardiac surgery, and death. The patient understands the risks of serious complication is low (<1%) and he agrees to proceed.   Misty Miller,  Anadia Helmes,MD 8:54 PM

## 2014-12-23 NOTE — H&P (View-Only) (Signed)
Patient Information: 1. Suspected CAD (No Prior PCI, No Prior CABG, and No Prior Angiogram Showing > = 50% Angiographic Stenosis) 2. Prior Noninvasive Testing: Stress Test With Imaging (SPECT MPI, Stress Echocardiography, Stress PET, Stress CMR) 3. Intermediate-risk findings (e.g., 5% to 10% ischemic myocardium on stress SPECT MPI or stress PET, stress-induced wall motion abnormality in a single segment on stress echo or stress CMR) 4. Pretest Symptom Status: Symptomatic A (7) Indication: 16;  [name] 12/23/2014

## 2014-12-23 NOTE — Interval H&P Note (Signed)
Cath Lab Visit (complete for each Cath Lab visit)  Clinical Evaluation Leading to the Procedure:   ACS: No.  Non-ACS:    Anginal Classification: CCS III  Anti-ischemic medical therapy: Maximal Therapy (2 or more classes of medications)  Non-Invasive Test Results: Intermediate-risk stress test findings: cardiac mortality 1-3%/year  Prior CABG: No previous CABG      History and Physical Interval Note:  12/23/2014 2:05 PM  Misty Miller  has presented today for surgery, with the diagnosis of chf  The various methods of treatment have been discussed with the patient and family. After consideration of risks, benefits and other options for treatment, the patient has consented to  Procedure(s): Left Heart Cath and Coronary Angiography (N/A) Intravascular Pressure Wire/FFR Study (N/A) as a surgical intervention .  The patient's history has been reviewed, patient examined, no change in status, stable for surgery.  I have reviewed the patient's chart and labs.  Questions were answered to the patient's satisfaction.     Misty Miller

## 2014-12-24 ENCOUNTER — Encounter (HOSPITAL_COMMUNITY): Payer: Self-pay | Admitting: Cardiology

## 2014-12-24 DIAGNOSIS — I5022 Chronic systolic (congestive) heart failure: Secondary | ICD-10-CM

## 2014-12-24 DIAGNOSIS — I2510 Atherosclerotic heart disease of native coronary artery without angina pectoris: Secondary | ICD-10-CM | POA: Diagnosis not present

## 2014-12-24 LAB — GLUCOSE, CAPILLARY
Glucose-Capillary: 192 mg/dL — ABNORMAL HIGH (ref 65–99)
Glucose-Capillary: 207 mg/dL — ABNORMAL HIGH (ref 65–99)

## 2014-12-24 LAB — BASIC METABOLIC PANEL
Anion gap: 9 (ref 5–15)
BUN: 17 mg/dL (ref 6–20)
CO2: 27 mmol/L (ref 22–32)
Calcium: 9.1 mg/dL (ref 8.9–10.3)
Chloride: 101 mmol/L (ref 101–111)
Creatinine, Ser: 1.08 mg/dL — ABNORMAL HIGH (ref 0.44–1.00)
GFR calc Af Amer: >60 mL/min (ref >60)
GFR calc non Af Amer: 57 mL/min — ABNORMAL LOW (ref >60)
Glucose, Bld: 235 mg/dL — ABNORMAL HIGH (ref 65–99)
Potassium: 4.1 mmol/L (ref 3.5–5.1)
Sodium: 137 mmol/L (ref 135–145)

## 2014-12-24 LAB — CBC
HCT: 32.8 % — ABNORMAL LOW (ref 36.0–46.0)
Hemoglobin: 10.9 g/dL — ABNORMAL LOW (ref 12.0–15.0)
MCH: 27.8 pg (ref 26.0–34.0)
MCHC: 33.2 g/dL (ref 30.0–36.0)
MCV: 83.7 fL (ref 78.0–100.0)
Platelets: 245 10*3/uL (ref 150–400)
RBC: 3.92 MIL/uL (ref 3.87–5.11)
RDW: 14.5 % (ref 11.5–15.5)
WBC: 9.9 10*3/uL (ref 4.0–10.5)

## 2014-12-24 LAB — PLATELET INHIBITION P2Y12: Platelet Function  P2Y12: 261 [PRU] (ref 194–418)

## 2014-12-24 MED ORDER — ATORVASTATIN CALCIUM 20 MG PO TABS: 40.0000 mg | ORAL_TABLET | Freq: Every day | ORAL | Status: DC

## 2014-12-24 MED ORDER — TICAGRELOR 90 MG PO TABS
180.0000 mg | ORAL_TABLET | Freq: Once | ORAL | Status: AC
  Administered 2014-12-24: 180 mg via ORAL

## 2014-12-24 MED ORDER — TICAGRELOR 90 MG PO TABS: 90.0000 mg | ORAL_TABLET | Freq: Two times a day (BID) | ORAL | Status: DC

## 2014-12-24 MED ORDER — ISOSORB DINITRATE-HYDRALAZINE 20-37.5 MG PO TABS
2.0000 | ORAL_TABLET | Freq: Three times a day (TID) | ORAL | Status: DC
  Administered 2014-12-24: 11:00:00 2 via ORAL
  Filled 2014-12-24 (×3): qty 2

## 2014-12-24 MED ORDER — ASPIRIN EC 81 MG PO TBEC
81.0000 mg | DELAYED_RELEASE_TABLET | Freq: Every day | ORAL | Status: DC
  Administered 2014-12-24: 11:00:00 81 mg via ORAL
  Filled 2014-12-24: qty 1

## 2014-12-24 MED ORDER — TICAGRELOR 90 MG PO TABS
90.0000 mg | ORAL_TABLET | Freq: Two times a day (BID) | ORAL | Status: DC
  Filled 2014-12-24: qty 1

## 2014-12-24 MED FILL — Heparin Sodium (Porcine) 2 Unit/ML in Sodium Chloride 0.9%: INTRAMUSCULAR | Qty: 1000 | Status: AC

## 2014-12-24 NOTE — Discharge Instructions (Signed)
No driving for 24 hours. No lifting over 5 lbs for 1 week. No sexual activity for 1 week. You may return to work on 12/25/2014. Keep procedure site clean & dry. If you notice increased pain, swelling, bleeding or pus, call/return!  You may shower, but no soaking baths/hot tubs/pools for 1 week.

## 2014-12-24 NOTE — Progress Notes (Signed)
CARDIAC REHAB PHASE I   PRE:  Rate/Rhythm: 85 SR  BP:  Supine: 129/83  Sitting:   Standing:    SaO2:   MODE:  Ambulation: 550 ft   POST:  Rate/Rhythm: 99 SR  BP:  Supine:   Sitting: 143/90  Standing:    SaO2:  0805-0855 Pt walked 550 ft with steady gait. Stated her breathing felt better. No CP. Education completed with pt who voiced understanding. Stressed importance of plavix with stent. Pt had CHF booklet with Feb admission but reviewed briefly with teachback of when to call MD and daily weights and low sodium. Discussed CRP 2 and pt gave permission to refer to [place].   Luetta Nutting, RN BSN  12/24/2014 8:51 AM

## 2014-12-24 NOTE — Discharge Summary (Signed)
Discharge Summary   Patient ID: Misty Miller,  MRN: [id number], DOB/AGE: 54-Feb-1962 54 y.o.  Admit date: 12/23/2014 Discharge date: 12/24/2014  Primary Care Provider: Gaspar Garbe Primary Cardiologist: Dr. [facility]  Discharge Diagnoses Principal Problem:   CAD in native artery Active Problems:   HTN (hypertension)   DM (diabetes mellitus), type 2, uncontrolled   Chronic systolic CHF (congestive heart failure)   Allergies No Known Allergies  Procedures  Cath #1 by Dr. Shirlee Latch 12/23/2014 Conclusion    Misty Miller has significant RCA disease with severe proximal stenosis and moderate mid and distal RCA stenosis. I suspect this is the etiology of her periodic chest pain. She may have had true ACS at prior admission with mildly elevated troponin. As there was no perfusion defect on Cardiolite, she will have FFR done. If this suggests significant disease, she will undergo PCI. She is a reasonable candidate for DAPT. Dr Katrinka Blazing to do FFR and possible PCI.     Coronary Findings    Dominance: Right   Left Main  No significant disease.     Left Anterior Descending  Luminal irregularities     Left Circumflex  Luminal irregularities     Right Coronary Artery  80-90% discrete proximal RCA stenosis, 50% mid RCA stenosis, 60% distal RCA stenosis.         Cardiac catheterization #2 by Dr. Katrinka Blazing 12/23/2014 1. Mid RCA lesion, 70% stenosed. 2. Dist RCA lesion, 70% stenosed. 3. Prox RCA lesion, 95% stenosed. There is a 0% residual stenosis post intervention. 4. A drug-eluting stent was placed.   Highly tortuous and angulated dominant right coronary with hemodynamically significant proximal stenosis reduced from 90% to 0% with drug-eluting stent implantation.  Potentially significant mid and distal lesions were not treated due to severe mid vessel tortuosity that prevented assessment with the pressure catheter. This was due to the angulation in the mid vessel.  This raised concern that the stent would not be able to traverse the angulation.  Future consideration of PCI of the mid and distal vessel should be of the patient develops anginal symptoms or ischemia on myocardial perfusion imaging. She did develop angina during balloon inflation which she has never before felt.  Recommendations:   Aggressive risk factor modification  Aspirin and Plavix  Discharge in a.m.  Management per Dr. [facility] Course  54 yo female with PMH of HTN, DM and chronic systolic heart failure with EF 35-40% by echocardiogram. She has a history of poorly controlled hypertension and previous admission for hypertensive emergency due to poor follow-up and economic issues. She was initially admitted to [facility] in February 2016 with hypertensive emergency and flash pulmonary edema after stopping her ACE inhibitor for months. She was restarted on blood pressure medication and was diuresed with IV Lasix. Echocardiogram was done at that time which showed EF 35-40% with basal inferior akinesis and a mild to moderate MR. Troponin was mildly elevated. VQ scan was normal. Renal artery Doppler showed no arterial stenosis. Lexiscan Cardiolite was done which showed no ischemia or infarction, EF 35%. Patient presented to follow-up with heart failure clinic on 12/18/2014 at which time she admitted to have some chest heaviness with activities. She has been taking all her medications and her blood pressure at home has been around 140 to 150s. During the office follow up, her BiDil was increased to 2 tablets 3 times a day. She continue to take Lasix, spironolactone, amlodipine and Coreg. Given the persistent chest  heaviness, outpatient cardiac catheterization was scheduled to definitively rule out underlying coronary disease.  She underwent a scheduled outpatient cardiac catheterization on 12/23/2014 which showed luminal irregularities in LAD and left circumflex, 80-90% discrete  proximal RCA stenosis, 50% mid RCA stenosis, 60% distal RCA stenosis. Overall, she has single vessel disease was highly tortuous and angulated dominant RCA. Given recent negative Myoview, Dr. Katrinka Blazing did FFR with adenosine. Proximal FFR was 0.76. Although there was some concern about the presence of mid vessel bend however was chosen against further more aggressive intervention through the angulated segment for fear of delivering a stent would not be possible, therefore mid RCA lesion was not treated. As for proximal RCA lesion, it was treated with 3.0 x 12 mm balloon and a 3.0 x 20 mm Promus per meter drug-eluting stent. Per Dr. Katrinka Blazing, further consideration of PCI of mid and distal vessels can be considered at a later time if patient develops anginal symptom or ischemia on myocardial perfusion imaging. She was placed on aspirin and Plavix.  She was seen on the following day on 12/24/2014, P2Y12 test was elevated 261. She was switched to Brilinta. Her Lipitor was increased to 40 mg daily. She is deemed stable for discharge from cardiology perspective after seen by cardiac rehabilitation. Emphasis has been placed on compliance with DAPT. She has 2 week outpatient followup.   Discharge Vitals Blood pressure 129/83, pulse 78, temperature 97.7 F (36.5 C), temperature source Oral, resp. rate 18, height  (1.575 m), weight 196 lb 3.4 oz (89 kg), SpO2 98 %.  Filed Weights   12/23/14 0916 12/24/14 0027  Weight: 200 lb (90.719 kg) 196 lb 3.4 oz (89 kg)    Labs  CBC  Recent Labs  12/23/14 1041 12/24/14 0320  WBC 7.3 9.9  HGB 12.3 10.9*  HCT 36.5 32.8*  MCV 83.7 83.7  PLT 271 245   Basic Metabolic Panel  Recent Labs  12/23/14 1041 12/24/14 0320  NA 138 137  K 5.1 4.1  CL 101 101  CO2 30 27  GLUCOSE 235* 235*  BUN 19 17  CREATININE 0.88 1.08*  CALCIUM 9.5 9.1    Disposition  Pt is being discharged home today in good condition.  Follow-up Plans & Appointments      Follow-up  Information    Follow up with [facility] On 01/06/2015.   Specialty:  Cardiology   Why:  3:40pm   Contact information:   [address] [id number] [place] [address] [phone]      Follow up with Quintella Reichert, MD On 02/16/2015.   Specialty:  Cardiology   Why:  8:15am   Contact information:   [address] [place] [address] [phone]       Discharge Medications    Medication List    TAKE these medications        amLODipine 10 MG tablet  Commonly known as:  NORVASC  Take 1 tablet (10 mg total) by mouth daily.     aspirin 81 MG EC tablet  Take 1 tablet (81 mg total) by mouth daily.     atorvastatin 20 MG tablet  Commonly known as:  LIPITOR  Take 2 tablets (40 mg total) by mouth daily at 6 PM.     carvedilol 25 MG tablet  Commonly known as:  COREG  Take 1 tablet (25 mg total) by mouth 2 (two) times daily with a meal.  furosemide 40 MG tablet  Commonly known as:  LASIX  Take 1 tablet (40 mg total) by mouth daily.     isosorbide-hydrALAZINE 20-37.5 MG per tablet  Commonly known as:  BIDIL  Take 2 tablets by mouth 2 (two) times daily.     spironolactone 25 MG tablet  Commonly known as:  ALDACTONE  Take 1 tablet (25 mg total) by mouth daily.     ticagrelor 90 MG Tabs tablet  Commonly known as:  BRILINTA  Take 1 tablet (90 mg total) by mouth 2 (two) times daily.        Duration of Discharge Encounter   Greater than 30 minutes including physician time.  Ramond Dial PA-C Pager: [phone] 12/24/2014, 9:27 AM

## 2014-12-24 NOTE — Progress Notes (Signed)
Patient ID: Misty Miller, female   DOB: Nov 25, 1960, 54 y.o.   MRN: [id number]   SUBJECTIVE: Patient is doing well this morning.  She walked without dyspnea or chest pain.  P2Y12 test elevated, 261 PRU.   Scheduled Meds: . adenosine  16 mL Intravenous Once  . amLODipine  10 mg Oral Daily  . aspirin EC  325 mg Oral Daily  . atorvastatin  80 mg Oral q1800  . carvedilol  25 mg Oral BID WC  . clopidogrel  75 mg Oral Q breakfast  . furosemide  40 mg Oral Daily  . insulin aspart  0-9 Units Subcutaneous TID WC  . isosorbide-hydrALAZINE  2 tablet Oral TID  . sodium chloride  3 mL Intravenous Q12H  . spironolactone  25 mg Oral Daily   Continuous Infusions:  PRN Meds:.sodium chloride, acetaminophen, ondansetron (ZOFRAN) IV, oxyCODONE-acetaminophen, sodium chloride    Filed Vitals:   12/23/14 2011 12/24/14 0027 12/24/14 0653 12/24/14 0803  BP: 128/72 113/54 135/78 129/83  Pulse: 70 81 73 78  Temp: 98.1 F (36.7 C) 97.7 F (36.5 C) 97.8 F (36.6 C) 97.7 F (36.5 C)  TempSrc: Oral Oral Oral Oral  Resp: Height:      Weight:  196 lb 3.4 oz (89 kg)    SpO2: 95% 95% 98% 98%    Intake/Output Summary (Last 24 hours) at 12/24/14 0855 Last data filed at 12/24/14 0653  Gross per 24 hour  Intake 1174.21 ml  Output   1950 ml  Net -775.79 ml    LABS: Basic Metabolic Panel:  Recent Labs  65/78/46 1041 12/24/14 0320  NA 138 137  K 5.1 4.1  CL 101 101  CO2 30 27  GLUCOSE 235* 235*  BUN 19 17  CREATININE 0.88 1.08*  CALCIUM 9.5 9.1   Liver Function Tests: No results for input(s): AST, ALT, ALKPHOS, BILITOT, PROT, ALBUMIN in the last 72 hours. No results for input(s): LIPASE, AMYLASE in the last 72 hours. CBC:  Recent Labs  12/23/14 1041 12/24/14 0320  WBC 7.3 9.9  HGB 12.3 10.9*  HCT 36.5 32.8*  MCV 83.7 83.7  PLT 271 245   Cardiac Enzymes: No results for input(s): CKTOTAL, CKMB, CKMBINDEX, TROPONINI in the last 72 hours. BNP: Invalid input(s):  POCBNP D-Dimer: No results for input(s): DDIMER in the last 72 hours. Hemoglobin A1C: No results for input(s): HGBA1C in the last 72 hours. Fasting Lipid Panel: No results for input(s): CHOL, HDL, LDLCALC, TRIG, CHOLHDL, LDLDIRECT in the last 72 hours. Thyroid Function Tests: No results for input(s): TSH, T4TOTAL, T3FREE, THYROIDAB in the last 72 hours.  Invalid input(s): FREET3 Anemia Panel: No results for input(s): VITAMINB12, FOLATE, FERRITIN, TIBC, IRON, RETICCTPCT in the last 72 hours.  RADIOLOGY: No results found.  PHYSICAL EXAM General: NAD Neck: No JVD, no thyromegaly or thyroid nodule.  Lungs: Clear to auscultation bilaterally with normal respiratory effort. CV: Nondisplaced PMI.  Heart regular S1/S2, no S3/S4, no murmur.  No peripheral edema.   Abdomen: Soft, nontender, no hepatosplenomegaly, no distention.  Neurologic: Alert and oriented x 3.  Psych: Normal affect. Extremities: No clubbing or cyanosis.   TELEMETRY: Reviewed telemetry pt in NSR  ASSESSMENT AND PLAN: 54 yo with history of HTN and cardiomyopathy and episodes of chest pain had LHC yesterday showing severe proximal RCA stenosis.  She had DES to proximal RCA.  1. CAD: Patient [facility] this year with CHF and elevated troponin.  No cath  due to AKI and Cardiolite without defect.  However, she has been having episodes of chest pain at rest and with exertion.  LHC yesterday showed severe RCA disease confirmed by FFR. She had Promus DES to proximal RCA.  P2Y12 test was done this morning suggesting inadequate response to Plavix.  - I will increase atorvastatin to 40 mg daily.  - I will use Brilinta instead of Plavix given high P2Y12 result.  - She will need cardiac rehab.  2. HTN: BP under much better control.  3. H/o AKI: Creatinine normal this morning.  4. Chronic systolic CHF: Had thought it was primarily hypertensive CMP but did have significant CAD.  Continue current CHF meds as per home  regimen and will need followup echo.  She is euvolemic on exam.  5. Disposition: May go home today.  Needs CHF clinic followup.  Needs cardiac rehab.  Meds for home: Continue prior home meds with these changes: ASA 81 daily, Brilinta 90 mg bid, increase atorvastatin to 40 daily.    Misty Miller 12/24/2014 9:02 AM

## 2014-12-24 NOTE — Progress Notes (Addendum)
CM spoke with pt in room regarding Brilinta, pamphlet given with 30 day free card  and copay  card enclosed. Benefits  Check in process, CM will f/u with results if availble prior to d/c. [facility], [address], called by CM and confirmed medication is in stock ,pt made aware. No other needs identified @ present time.

## 2014-12-25 ENCOUNTER — Telehealth (HOSPITAL_COMMUNITY): Payer: Self-pay | Admitting: Cardiology

## 2014-12-25 NOTE — Telephone Encounter (Signed)
Pt scheduled for RHC on 12/25/14 per Dr.McLEan With pts current insurance- [employer] NO pre cert required- call reference number [phone]

## 2015-01-05 ENCOUNTER — Other Ambulatory Visit: Payer: Self-pay

## 2015-01-06 ENCOUNTER — Encounter (HOSPITAL_COMMUNITY): Payer: Self-pay

## 2015-01-06 ENCOUNTER — Ambulatory Visit (HOSPITAL_COMMUNITY)
Admission: RE | Admit: 2015-01-06 | Discharge: 2015-01-06 | Disposition: A | Discharge status: Home / Self Care | Payer: Managed Care, Other (non HMO) | Source: Ambulatory Visit | Attending: Cardiology | Admitting: Cardiology

## 2015-01-06 VITALS — BP 158/92 | HR 84 | Wt 205.8 lb

## 2015-01-06 DIAGNOSIS — Z7982 Long term (current) use of aspirin: Secondary | ICD-10-CM | POA: Diagnosis not present

## 2015-01-06 DIAGNOSIS — I5022 Chronic systolic (congestive) heart failure: Secondary | ICD-10-CM | POA: Insufficient documentation

## 2015-01-06 DIAGNOSIS — G4733 Obstructive sleep apnea (adult) (pediatric): Secondary | ICD-10-CM | POA: Insufficient documentation

## 2015-01-06 DIAGNOSIS — I2510 Atherosclerotic heart disease of native coronary artery without angina pectoris: Secondary | ICD-10-CM | POA: Insufficient documentation

## 2015-01-06 DIAGNOSIS — E119 Type 2 diabetes mellitus without complications: Secondary | ICD-10-CM | POA: Diagnosis not present

## 2015-01-06 DIAGNOSIS — I10 Essential (primary) hypertension: Secondary | ICD-10-CM | POA: Diagnosis not present

## 2015-01-06 DIAGNOSIS — E785 Hyperlipidemia, unspecified: Secondary | ICD-10-CM | POA: Insufficient documentation

## 2015-01-06 DIAGNOSIS — Z79899 Other long term (current) drug therapy: Secondary | ICD-10-CM | POA: Insufficient documentation

## 2015-01-06 DIAGNOSIS — R002 Palpitations: Secondary | ICD-10-CM | POA: Diagnosis not present

## 2015-01-06 LAB — BASIC METABOLIC PANEL
Anion gap: 6 (ref 5–15)
BUN: 14 mg/dL (ref 6–20)
CO2: 29 mmol/L (ref 22–32)
Calcium: 9 mg/dL (ref 8.9–10.3)
Chloride: 102 mmol/L (ref 101–111)
Creatinine, Ser: 0.76 mg/dL (ref 0.44–1.00)
GFR calc Af Amer: >60 mL/min (ref >60)
GFR calc non Af Amer: >60 mL/min (ref >60)
Glucose, Bld: 266 mg/dL — ABNORMAL HIGH (ref 65–99)
Potassium: 4.2 mmol/L (ref 3.5–5.1)
Sodium: 137 mmol/L (ref 135–145)

## 2015-01-06 MED ORDER — CLONIDINE HCL 0.1 MG PO TABS: 0.1000 mg | ORAL_TABLET | Freq: Two times a day (BID) | ORAL | Status: DC

## 2015-01-06 NOTE — Progress Notes (Signed)
Patient ID: Misty Miller, female   DOB: April 13, 1961, 54 y.o.   MRN: [id number] PCP: Dr. Wylene Simmer  54 yo with poorly controlled HTN and CHF presents for cardiology followup.  She has had HTN and diabetes for years.  She has had somewhat sporadic medical care due to economic issues (out of work).  She had been on BP meds in the past but had been rationing them due to finances.  She was admitted to [facility] in 2/16 with a hypertensive emergency and flash pulmonary edema. She had been off irbesartan for months.  She was initially given IV Lasix (just one dose) and started back on irbesartan.  Creatinine rose from 0.95 at admission to 2.71.  Irbesartan and Lasix were stopped.  Echo was done, showing EF 35-40% with basal inferior akinesis and mild to moderate MR.  Troponin was mildly elevated to peak 0.35.  Due to elevated creatinine, she did not have cardiac cath.  Lexiscan Cardiolite was done, showing EF 35% but no ischemia or infarction.  V/Q scan was normal.  BP was controlled and she was sent home.  Creatinine has improved and she is now on Lasix.    She returns for HF follow up after a LHC with PCI to severe proximal RCA stenosis. She had been having intermittent chest heaviness at rest and with exertion, but has had no chest pain since her stent was placed. Taking all medications. Had rash for several days after starting Brilinta, but went away without complication.  She used an anti itch cream on it. SBP at home 150s. Currently unemployed. She denies SOB (this is improved) but complains of fatigue. Has not followed up about getting CPAP, moderate OSA was found on sleep study.  Labs (2/16): K 4.3, creatinine 0.95 => 2.71 => 1.78, TnI 0.35, Hgb 10, LDL 149, HCT 31, TSH normal, plasma aldosterone 1, urine catecholeamines normal.  Labs (3/16): K 4, creatinine 1.11 Labs (4/16): K 3.9, creatinine 1.09 Labs (12/24/14): K 4.1, creatinine 1.08, P2Y12 261  PMH: 1. HTN: For > 18 yrs, poor control historically.  Renal artery dopplers showed no renal artery stenosis (2/16).   2. Type II diabetes. 3. 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  Admitted 2/16 with hypertensive emergency and pulmonary edema.  Echo (2/16) with EF 35-40%, basal inferior akinesis, mild-moderate MR.  Lexiscan Cardiolite (2/16) with EF 35%, no ischemia or infarction but LHC showed severe RCA disease, as below.    4. Hyperlipidemia 5. AKI/CKD: AKI in 2/16 in the setting of ARB and diuresis.  6. CAD: LHC (6/16) with 90% proximal RCA, 50% mRCA, 60% dRCA.  Promus DES to proximal RCA.  7. OSA: Moderate 8. Inadequate platelet inhibition with Plavix.  9. Palpitations: Holter in 4/16 showed primarily NSR, rare PVCs, with 10 beat run of AIVR.  SH: Married, unemployed, nonsmoker, no ETOH.   FH: Diabetes and HTN in multiple family members.  Grandfather MI at 72.  Mother with CHF, no history of MI.  ROS: All systems reviewed and negative except as per HPI.   Current Outpatient Prescriptions  Medication Sig Dispense Refill  . amLODipine (NORVASC) 10 MG tablet Take 1 tablet (10 mg total) by mouth daily. 30 tablet 3  . aspirin EC 81 MG EC tablet Take 1 tablet (81 mg total) by mouth daily.    Marland Kitchen atorvastatin (LIPITOR) 20 MG tablet Take 2 tablets (40 mg total) by mouth daily at 6 PM. 60 tablet 2  . carvedilol (COREG) 25  MG tablet Take 1 tablet (25 mg total) by mouth 2 (two) times daily with a meal. 60 tablet 3  . furosemide (LASIX) 40 MG tablet Take 1 tablet (40 mg total) by mouth daily. 30 tablet 3  . isosorbide-hydrALAZINE (BIDIL) 20-37.5 MG per tablet Take 2 tablets by mouth 2 (two) times daily. 60 tablet 3  . spironolactone (ALDACTONE) 25 MG tablet Take 1 tablet (25 mg total) by mouth daily. 90 tablet 3  . ticagrelor (BRILINTA) 90 MG TABS tablet Take 1 tablet (90 mg total) by mouth 2 (two) times daily. 60 tablet 11   No current facility-administered medications for this encounter.   LMP  (LMP  Unknown) General: NAD Neck: JVP flat no thyromegaly or thyroid nodule.  Lungs: Clear to auscultation bilaterally with normal respiratory effort. CV: Nondisplaced PMI.  Heart regular S1/S2, +S4, no murmur.  No peripheral edema.  No carotid bruit.  Normal pedal pulses.  Abdomen: Soft, nontender, no hepatosplenomegaly, no distention.  Skin: Intact without lesions or rashes.  Neurologic: Alert and oriented x 3.  Psych: Normal affect. Extremities: No clubbing or cyanosis.  HEENT: Normal.   Assessment/Plan: 1. Chronic systolic CHF: EF 18-56% by echo 08/17/14.  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  NYHA class II, euvolemic on exam.  - Continue current lasix, spironolactone, Coreg. - She is on Bidil 2 tabs tid. - AKI when placed on ARB in hospital, will consider adding low dose in the future.  2. CAD: LHC 12/23/14 showed severe proximal RCA stenosis treated with Promus DES.  She was a Plavix non-responder so was transitioned to Brilinta 90 mg bid.  Continue ASA 81 and atorvastatin 40.  Will refer to cardiac rehab.  3. HTN: BP elevated.  Renal artery dopplers normal.  Urinary catecholeamines negative, screen for hyperaldosteronism negative.  As above, imperative to control BP.  She does have untreated OSA.  - Continue current dose of coreg, bidil, amlodipine, and spironolactone. - Will add clonidine 0.1 mg BID to attempt better control. - Needs appointment with sleep medicine to get CPAP.  4. CKD: AKI at last admission after starting ARB, but fully recovered.  No evidence for renal artery stenosis on renal artery dopplers. - Suspect some baseline renal damage from long-standing HTN and diabetes.    Still holding off on ACEI.  Nephrology following. 5. Palpitations:  Holter in 4/16 showed primarily NSR, rare PVCs, with 10 beat run of AIVR. - Has been much better since the stent per pt.  [name] 01/07/2015

## 2015-01-06 NOTE — Patient Instructions (Signed)
Start Clonidine 0.1 mg Twice daily   Lab today  Your physician has requested that you have an echocardiogram. Echocardiography is a painless test that uses sound waves to create images of your heart. It provides your doctor with information about the size and shape of your heart and how well your heart's chambers and valves are working. This procedure takes approximately one hour. There are no restrictions for this procedure.  IN 3 MONTHS  Your physician recommends that you schedule a follow-up appointment in: 3 months

## 2015-01-08 DIAGNOSIS — Z0271 Encounter for disability determination: Secondary | ICD-10-CM

## 2015-01-29 ENCOUNTER — Other Ambulatory Visit: Payer: Self-pay

## 2015-01-29 MED ORDER — CLONIDINE HCL 0.1 MG PO TABS: 0.1000 mg | ORAL_TABLET | Freq: Two times a day (BID) | ORAL | Status: DC

## 2015-02-05 ENCOUNTER — Encounter (HOSPITAL_COMMUNITY)
Admission: RE | Admit: 2015-02-05 | Discharge: 2015-02-05 | Disposition: A | Discharge status: Home / Self Care | Payer: Managed Care, Other (non HMO) | Source: Ambulatory Visit | Attending: Cardiology | Admitting: Cardiology

## 2015-02-05 NOTE — Progress Notes (Signed)
Cardiac Rehab Medication Review by a Pharmacist  Does the patient  feel that his/her medications are working for him/her?  yes  Has the patient been experiencing any side effects to the medications prescribed?  no  Does the patient measure his/her own blood pressure or blood glucose at home?  yes   Does the patient have any problems obtaining medications due to transportation or finances?   no  Understanding of regimen: excellent Understanding of indications: good Potential of compliance: excellent    Pharmacist comments: Patient was compliant and seemed eager about her disease state control.  [name] C. [name], PharmD Pharmacy Resident  Pager: [phone] 02/05/2015 8:37 AM

## 2015-02-09 ENCOUNTER — Encounter (HOSPITAL_COMMUNITY)
Admission: RE | Admit: 2015-02-09 | Discharge: 2015-02-09 | Disposition: A | Discharge status: Home / Self Care | Payer: Managed Care, Other (non HMO) | Source: Ambulatory Visit | Attending: Cardiology | Admitting: Cardiology

## 2015-02-09 DIAGNOSIS — Z955 Presence of coronary angioplasty implant and graft: Secondary | ICD-10-CM | POA: Diagnosis not present

## 2015-02-09 LAB — GLUCOSE, CAPILLARY
Glucose-Capillary: 283 mg/dL — ABNORMAL HIGH (ref 65–99)
Glucose-Capillary: 253 mg/dL — ABNORMAL HIGH (ref 65–99)

## 2015-02-09 NOTE — Progress Notes (Signed)
Pt started Phase II cardiac rehab today in 6:45 class.  Pt tolerated light exercise without difficulty. VSS slight elevation in bp on stationary bike.  Pt took her am meds at 6 am. telemetry-SR with not ectopy, asymptomatic.  Medication list reconciled.  Pt verbalized compliance with medications and denies barriers to compliance at present.Pt with history on medication non compliance due to finances.  Pt laid off from her job and presently is not working.  Pt is covered for medical insurance by her husband plan and is able to pay for medications.  Pt understands the importance of taken her medications PSYCHOSOCIAL ASSESSMENT:  PHQ-1. Pt admits she has some days that she feels more depressed than others. She tries not to think about her financial circumstances of what she doesn't have but dwell on what she does have. Pt has a daughter who is in the nursing program at [employer]. Her daughter is a Medical laboratory scientific officer.Pt lives with her mom. Depends upon mom and help from family members to manage bills. Pt has applied for disability and hopes to hear something in September.No psychosocial needs identified at this time, no psychosocial interventions necessary.    Pt enjoys travel.   Pt cardiac rehab  goal is  to improve strength and loses weight.  Pt encouraged to participate in home exercise on the off days from rehab and education classes to increase ability to achieve these goals. Pt joined [employer] and has begun exercising on the treadmill, stepper and beginner zumba classes.  Pt long term cardiac rehab goal is lose weight and improve cardio. Pt advised to attend nutrition classes on tuesdays as well as consultation with the registered dietician/ diabetes educator. Pt oriented to exercise equipment and routine.  Understanding verbalized. Alanson Aly, BSN

## 2015-02-11 ENCOUNTER — Encounter (HOSPITAL_COMMUNITY)
Admission: RE | Admit: 2015-02-11 | Discharge: 2015-02-11 | Disposition: A | Discharge status: Home / Self Care | Payer: Managed Care, Other (non HMO) | Source: Ambulatory Visit | Attending: Cardiology | Admitting: Cardiology

## 2015-02-11 DIAGNOSIS — Z955 Presence of coronary angioplasty implant and graft: Secondary | ICD-10-CM | POA: Diagnosis not present

## 2015-02-11 LAB — GLUCOSE, CAPILLARY
Glucose-Capillary: 285 mg/dL — ABNORMAL HIGH (ref 65–99)
Glucose-Capillary: 254 mg/dL — ABNORMAL HIGH (ref 65–99)

## 2015-02-13 ENCOUNTER — Encounter (HOSPITAL_COMMUNITY): Payer: Managed Care, Other (non HMO)

## 2015-02-16 ENCOUNTER — Ambulatory Visit: Payer: Managed Care, Other (non HMO) | Admitting: Cardiology

## 2015-02-16 ENCOUNTER — Encounter (HOSPITAL_COMMUNITY): Payer: Managed Care, Other (non HMO)

## 2015-02-18 ENCOUNTER — Encounter (HOSPITAL_COMMUNITY)
Admission: RE | Admit: 2015-02-18 | Discharge: 2015-02-18 | Disposition: A | Discharge status: Home / Self Care | Payer: Managed Care, Other (non HMO) | Source: Ambulatory Visit | Attending: Cardiology | Admitting: Cardiology

## 2015-02-18 DIAGNOSIS — Z955 Presence of coronary angioplasty implant and graft: Secondary | ICD-10-CM | POA: Diagnosis not present

## 2015-02-18 LAB — GLUCOSE, CAPILLARY
Glucose-Capillary: 278 mg/dL — ABNORMAL HIGH (ref 65–99)
Glucose-Capillary: 257 mg/dL — ABNORMAL HIGH (ref 65–99)

## 2015-02-20 ENCOUNTER — Encounter (HOSPITAL_COMMUNITY): Payer: Managed Care, Other (non HMO)

## 2015-02-23 ENCOUNTER — Encounter (HOSPITAL_COMMUNITY): Payer: Managed Care, Other (non HMO)

## 2015-02-23 ENCOUNTER — Emergency Department (HOSPITAL_COMMUNITY)
Admission: EM | Admit: 2015-02-23 | Discharge: 2015-02-23 | Disposition: A | Discharge status: Home / Self Care | Payer: Managed Care, Other (non HMO) | Attending: Emergency Medicine | Admitting: Emergency Medicine

## 2015-02-23 ENCOUNTER — Emergency Department (HOSPITAL_COMMUNITY): Payer: Managed Care, Other (non HMO)

## 2015-02-23 ENCOUNTER — Encounter (HOSPITAL_COMMUNITY): Payer: Self-pay | Admitting: Family Medicine

## 2015-02-23 DIAGNOSIS — I129 Hypertensive chronic kidney disease with stage 1 through stage 4 chronic kidney disease, or unspecified chronic kidney disease: Secondary | ICD-10-CM | POA: Insufficient documentation

## 2015-02-23 DIAGNOSIS — E785 Hyperlipidemia, unspecified: Secondary | ICD-10-CM | POA: Insufficient documentation

## 2015-02-23 DIAGNOSIS — R079 Chest pain, unspecified: Secondary | ICD-10-CM | POA: Insufficient documentation

## 2015-02-23 DIAGNOSIS — I2510 Atherosclerotic heart disease of native coronary artery without angina pectoris: Secondary | ICD-10-CM | POA: Insufficient documentation

## 2015-02-23 DIAGNOSIS — R0602 Shortness of breath: Secondary | ICD-10-CM | POA: Insufficient documentation

## 2015-02-23 DIAGNOSIS — Z87891 Personal history of nicotine dependence: Secondary | ICD-10-CM | POA: Diagnosis not present

## 2015-02-23 DIAGNOSIS — I252 Old myocardial infarction: Secondary | ICD-10-CM | POA: Diagnosis not present

## 2015-02-23 DIAGNOSIS — Z79899 Other long term (current) drug therapy: Secondary | ICD-10-CM | POA: Diagnosis not present

## 2015-02-23 DIAGNOSIS — Z9889 Other specified postprocedural states: Secondary | ICD-10-CM | POA: Insufficient documentation

## 2015-02-23 DIAGNOSIS — Z9861 Coronary angioplasty status: Secondary | ICD-10-CM | POA: Diagnosis not present

## 2015-02-23 DIAGNOSIS — I509 Heart failure, unspecified: Secondary | ICD-10-CM | POA: Insufficient documentation

## 2015-02-23 DIAGNOSIS — E119 Type 2 diabetes mellitus without complications: Secondary | ICD-10-CM | POA: Diagnosis not present

## 2015-02-23 DIAGNOSIS — Z7982 Long term (current) use of aspirin: Secondary | ICD-10-CM | POA: Diagnosis not present

## 2015-02-23 DIAGNOSIS — Z794 Long term (current) use of insulin: Secondary | ICD-10-CM | POA: Diagnosis not present

## 2015-02-23 DIAGNOSIS — N189 Chronic kidney disease, unspecified: Secondary | ICD-10-CM | POA: Insufficient documentation

## 2015-02-23 LAB — CBC
HCT: 35.1 % — ABNORMAL LOW (ref 36.0–46.0)
Hemoglobin: 11.9 g/dL — ABNORMAL LOW (ref 12.0–15.0)
MCH: 29 pg (ref 26.0–34.0)
MCHC: 33.9 g/dL (ref 30.0–36.0)
MCV: 85.6 fL (ref 78.0–100.0)
Platelets: 273 10*3/uL (ref 150–400)
RBC: 4.1 MIL/uL (ref 3.87–5.11)
RDW: 13.9 % (ref 11.5–15.5)
WBC: 8.5 10*3/uL (ref 4.0–10.5)

## 2015-02-23 LAB — BASIC METABOLIC PANEL
Anion gap: 11 (ref 5–15)
BUN: 16 mg/dL (ref 6–20)
CO2: 27 mmol/L (ref 22–32)
Calcium: 9.3 mg/dL (ref 8.9–10.3)
Chloride: 100 mmol/L — ABNORMAL LOW (ref 101–111)
Creatinine, Ser: 0.9 mg/dL (ref 0.44–1.00)
GFR calc Af Amer: >60 mL/min (ref >60)
GFR calc non Af Amer: >60 mL/min (ref >60)
Glucose, Bld: 202 mg/dL — ABNORMAL HIGH (ref 65–99)
Potassium: 3.6 mmol/L (ref 3.5–5.1)
Sodium: 138 mmol/L (ref 135–145)

## 2015-02-23 LAB — BRAIN NATRIURETIC PEPTIDE: B Natriuretic Peptide: 17.8 pg/mL (ref 0.0–100.0)

## 2015-02-23 LAB — I-STAT TROPONIN, ED
Troponin i, poc: 0 ng/mL (ref 0.00–0.08)
Troponin i, poc: 0 ng/mL (ref 0.00–0.08)

## 2015-02-23 MED ORDER — NITROGLYCERIN 0.4 MG SL SUBL
0.4000 mg | SUBLINGUAL_TABLET | SUBLINGUAL | Status: DC | PRN
  Administered 2015-02-23 (×2): 0.4 mg via SUBLINGUAL
  Filled 2015-02-23: qty 1

## 2015-02-23 NOTE — Discharge Instructions (Signed)
Continue your regular home medications as prescribed. Someone from the cardiology office should be called you with follow-up appt.  If you do not hear from them within the next 24 hours, please call to follow-up on this. Return here for any new or worsening symptoms.

## 2015-02-23 NOTE — ED Provider Notes (Signed)
CSN: [id number]     Arrival date & time 02/23/15  1255 History   First MD Initiated Contact with Patient 02/23/15 1504     Chief Complaint  Patient presents with  . Shortness of Breath  . Chest Pain     (Consider location/radiation/quality/duration/timing/severity/associated sxs/prior Treatment) Patient is a 54 y.o. female presenting with shortness of breath and chest pain. The history is provided by the patient and medical records.  Shortness of Breath Associated symptoms: chest pain   Chest Pain Associated symptoms: shortness of breath     This is a 54 year old female with history of hypertension, hyperlipidemia, congestive heart failure, diabetes, chronic kidney disease, coronary artery disease status post MI and stenting on 12/23/14, presenting to the ED for chest pressure and shortness of breath. Patient states symptoms initially began Friday afternoon as a "pressure" in her central chest without radiation. Pain was initially intermittent but has become constant today.  No alleviating or exacerbating factors. She reports some shortness of breath initially, however this has resolved. She denies any associated lightheadedness, dizziness, diaphoresis, nausea, or vomiting. No fever, chills, or upper respiratory symptoms.  Patient states she is currently undergoing cardiac rehabilitation recommended by her cardiology, Dr. Shirlee Latch, as she does have fatigue and decreased energy, especially with exertional activities. She states she did not attend her cardiac rehabilitation on Friday afternoon or today as she was experiencing pain.  Patient has already had her daily ASA today.  VSS.  Past Medical History  Diagnosis Date  . Hypertension   . Coronary artery disease     cath 12/23/2014 single vessel dx, DES to RCA, moderate mid RCA residual  . Family history of adverse reaction to anesthesia     "it's hard to wake my mom after procedure is over"  . Hyperlipemia   . CHF (congestive heart failure)  08/16/2014  . Myocardial infarction 08/16/2014    "mild"  . Sleep apnea     "recently tested positive; no mask yet" (12/23/2014)  . Type II diabetes mellitus   . History of blood transfusion 1992    S/P miscarriage  . Chronic kidney disease 08/16/2014   Past Surgical History  Procedure Laterality Date  . Coronary angioplasty with stent placement  12/23/2014    "1"  . Cesarean section  06/1995  . Tumor excision Right 2000's    "fatty; upper leg"  . Cardiac catheterization N/A 12/23/2014    Procedure: Left Heart Cath and Coronary Angiography;  Surgeon: Laurey Morale, MD;  Location: [facility] INVASIVE CV LAB;  Service: Cardiovascular;  Laterality: N/A;  . Cardiac catheterization N/A 12/23/2014    Procedure: Intravascular Pressure Wire/FFR Study;  Surgeon: Lyn Records, MD;  Location: [facility] INVASIVE CV LAB;  Service: Cardiovascular;  Laterality: N/A;  . Cardiac catheterization N/A 12/23/2014    Procedure: Coronary Stent Intervention;  Surgeon: Lyn Records, MD;  Location: [facility] INVASIVE CV LAB;  Service: Cardiovascular;  Laterality: N/A;   Family History  Problem Relation Age of Onset  . Cancer Mother    Social History  Substance Use Topics  . Smoking status: Former Smoker -- 0.25 packs/day for 20 years    Types: Cigarettes    Quit date: 11/09/1998  . Smokeless tobacco: Never Used  . Alcohol Use: Yes     Comment: 12/23/2014 "might have a couple mixed drinks once/month"   OB History    No data available     Review of Systems  Respiratory: Positive for shortness of breath.   Cardiovascular:  Positive for chest pain.  All other systems reviewed and are negative.     Allergies  Review of patient's allergies indicates no known allergies.  Home Medications   Prior to Admission medications   Medication Sig Start Date End Date Taking? Authorizing Provider  cloNIDine (CATAPRES) 0.1 MG tablet Take 1 tablet (0.1 mg total) by mouth 2 (two) times daily. 01/29/15  Yes Laurey Morale, MD  amLODipine  (NORVASC) 10 MG tablet Take 1 tablet (10 mg total) by mouth daily. 09/11/14   Laurey Morale, MD  aspirin EC 81 MG EC tablet Take 1 tablet (81 mg total) by mouth daily. 08/20/14   Tina Griffiths, MD  atorvastatin (LIPITOR) 20 MG tablet Take 2 tablets (40 mg total) by mouth daily at 6 PM. 12/24/14   Azalee Course, PA  carvedilol (COREG) 12.5 MG tablet Take 12.5 mg by mouth 2 (two) times daily with a meal.    Historical Provider, MD  furosemide (LASIX) 40 MG tablet Take 1 tablet (40 mg total) by mouth daily. 09/01/14   Laurey Morale, MD  Insulin Degludec 200 UNIT/ML SOPN Inject 10 Units into the skin. Take when blood glucose is >110    Historical Provider, MD  isosorbide-hydrALAZINE (BIDIL) 20-37.5 MG per tablet Take 2 tablets by mouth 2 (two) times daily. 12/18/14   Amy D Filbert Schilder, NP  spironolactone (ALDACTONE) 25 MG tablet Take 1 tablet (25 mg total) by mouth daily. 10/27/14   Laurey Morale, MD  ticagrelor (BRILINTA) 90 MG TABS tablet Take 1 tablet (90 mg total) by mouth 2 (two) times daily. 12/24/14   Azalee Course, PA   BP 128/73 mmHg  Pulse 63  Temp(Src) 98.2 F (36.8 C) (Oral)  Resp 18  Wt 208 lb 8 oz (94.575 kg)  SpO2 99%  LMP  (LMP Unknown)   Physical Exam  Constitutional: She is oriented to person, place, and time. She appears well-developed and well-nourished. No distress.  NAD, talking on cell phone with family  HENT:  Head: Normocephalic and atraumatic.  Mouth/Throat: Oropharynx is clear and moist.  Eyes: Conjunctivae and EOM are normal. Pupils are equal, round, and reactive to light.  Neck: Normal range of motion. Neck supple.  Cardiovascular: Normal rate, regular rhythm and normal heart sounds.   Pulmonary/Chest: Effort normal and breath sounds normal. No respiratory distress. She has no wheezes. She exhibits tenderness and bony tenderness.    Reproducible pain with palpation to midsternal region; no deformities of chest wall  Abdominal: Soft. Bowel sounds are normal. There is no  tenderness. There is no guarding.  Musculoskeletal: Normal range of motion. She exhibits no edema.  No pitting edema; no calf asymmetry, tenderness, palpable cords, overlying skin changes, or warmth to touch; DP pulses intact bilaterally  Neurological: She is alert and oriented to person, place, and time.  Skin: Skin is warm and dry. She is not diaphoretic.  Psychiatric: She has a normal mood and affect.  Nursing note and vitals reviewed.   ED Course  Procedures (including critical care time) Labs Review Labs Reviewed  BASIC METABOLIC PANEL - Abnormal; Notable for the following:    Chloride 100 (*)    Glucose, Bld 202 (*)    All other components within normal limits  CBC - Abnormal; Notable for the following:    Hemoglobin 11.9 (*)    HCT 35.1 (*)    All other components within normal limits  BRAIN NATRIURETIC PEPTIDE  I-STAT TROPOININ, ED  Rosezena Sensor, ED  Imaging Review Dg Chest 2 View  02/23/2015   CLINICAL DATA:  Chest pain and shortness of breath for 3 days. History of myocardial infarction and congestive heart failure. Initial encounter.  EXAM: CHEST  2 VIEW  COMPARISON:  08/17/2014 and 08/16/2014.  FINDINGS: The heart size and mediastinal contours are stable. There is a probable coronary artery stent, best seen on the lateral view. The lungs are now clear. The pulmonary vascularity is normal. There is no pleural effusion or pneumothorax. The bones appear unremarkable. Large right upper quadrant abdominal calcification is partially imaged, likely a gallstone.  IMPRESSION: Interval resolution of congestive heart failure. No acute cardiopulmonary process. Probable gallstone.   Electronically Signed   By: Carey Bullocks M.D.   On: 02/23/2015 14:19   I, SANDERS, LISA M, personally reviewed and evaluated these images and lab results as part of my medical decision-making.   EKG Interpretation   Date/Time:  Monday February 23 2015 13:07:00 EDT Ventricular Rate:  70 PR  Interval:  168 QRS Duration: 74 QT Interval:  408 QTC Calculation: 440 R Axis:   3 Text Interpretation:  Normal sinus rhythm Low voltage QRS Septal infarct ,  age undetermined Abnormal ECG Confirmed by KNOTT MD, DANIEL ([id number]) on  02/23/2015 3:04:11 PM      MDM   Final diagnoses:  Chest pain, unspecified chest pain type    54 year old female here with chest pressure.  Initially was intermittent but has become constant. There was some shortness of breath, this is resolved at this time. No other associated symptoms. Patient is afebrile, nontoxic. Vital signs are stable. EKG sinus rhythm without acute ischemic changes. Labwork is reassuring, troponin negative. Chest x-ray is clear. Patient's pain is reproducible with palpation of her midsternal region.  Her pain seems atypical for ACS, PE, or dissection.  Pain may be musculoskeletal as she recently began cardiac rehabilitation and has been exercising more than normal. Given patient's risk factors and recent MI with stenting, will obtain delta troponin.  Delta troponin remains normal. Vital signs remain stable. Patient states she feels well at this time. I spoke cardiology who will be scheduling follow-up appointment for her within the next week. Patient appears stable for discharge at this time. She will follow with her cardiologist, I recommended that if she does not hear from their office in 24 hours she called to follow-up.  Discussed plan with patient, he/she acknowledged understanding and agreed with plan of care.  Return precautions given for new or worsening symptoms.  Case discussed with attending physician, Dr. Clydene Pugh, who evaluated patient and agrees with assessment and plan of care.  Garlon Hatchet, PA-C 02/23/15 1803  Lyndal Pulley, MD 02/23/15 [phone]

## 2015-02-23 NOTE — ED Notes (Signed)
Pt here for chest pain and SOB that started Friday. sts the pain is constant. sts central chest.

## 2015-02-23 NOTE — ED Provider Notes (Signed)
Medical screening examination/treatment/procedure(s) were conducted as a shared visit with non-physician practitioner(s) and myself.  I personally evaluated the patient during the encounter.   EKG Interpretation   Date/Time:  Monday February 23 2015 13:07:00 EDT Ventricular Rate:  70 PR Interval:  168 QRS Duration: 74 QT Interval:  408 QTC Calculation: 440 R Axis:   3 Text Interpretation:  Normal sinus rhythm Low voltage QRS Septal infarct ,  age undetermined Abnormal ECG Confirmed by Anvith Mauriello MD, Dene Landsberg [phone]) on  02/23/2015 3:04:87 PM     54 year old female with significant cardiac/recent MI presenting with atypical chest pain that is reproducible on palpation after initiating cardiac rehabilitation. Pain ongoing for 4 days and has negative troponin, we'll plan to delta and arrange for expedited cardiology follow-up. Do not feel patient has symptoms consistent with ACS currently, will not require inpatient stay, strict return precautions discussed.  See related encounter note   Lyndal Pulley, MD 02/23/15 1601

## 2015-02-24 ENCOUNTER — Encounter (HOSPITAL_COMMUNITY): Payer: Self-pay

## 2015-02-24 NOTE — Progress Notes (Signed)
Medical record request received to [facility]  from [facility] [place]. All available records from our office/providers 10/2014 - present faced to provided # [phone]. Case # [id number] Copy of request scanned into electronic medical records.  Ave Filter

## 2015-02-25 ENCOUNTER — Encounter (HOSPITAL_COMMUNITY): Admission: RE | Admit: 2015-02-25 | Payer: Managed Care, Other (non HMO) | Source: Ambulatory Visit

## 2015-02-26 ENCOUNTER — Telehealth (HOSPITAL_COMMUNITY): Payer: Self-pay | Admitting: *Deleted

## 2015-02-26 NOTE — Telephone Encounter (Signed)
-----   Message from Laurey Morale, MD sent at 02/26/2015  8:48 AM EDT ----- Regarding: RE: ok to resume cardiac rehab That would be fine ----- Message -----    From: Chelsea Aus, RN    Sent: 02/26/2015   7:37 AM      To: Laurey Morale, MD Subject: ok to resume cardiac rehab                     Pt presented to ER on 8/15 with complaint of chest pain and shortness of breath.  Assessment negative for cardiac and felt her pain was related to musculoskeletal due to starting CR.  Pt called upon release and stated that she was told she could return on Friday. However I do not see any documentation in Epic?  Ok for pt to resume exercise? Next follow up with you is 8/25  Thanks   [employer] RN

## 2015-02-27 ENCOUNTER — Encounter (HOSPITAL_COMMUNITY): Admission: RE | Admit: 2015-02-27 | Payer: Managed Care, Other (non HMO) | Source: Ambulatory Visit

## 2015-03-02 ENCOUNTER — Encounter (HOSPITAL_COMMUNITY): Payer: Managed Care, Other (non HMO)

## 2015-03-02 ENCOUNTER — Other Ambulatory Visit (HOSPITAL_COMMUNITY): Payer: Self-pay | Admitting: Cardiology

## 2015-03-04 ENCOUNTER — Encounter (HOSPITAL_COMMUNITY): Payer: Managed Care, Other (non HMO)

## 2015-03-05 ENCOUNTER — Ambulatory Visit (HOSPITAL_COMMUNITY)
Admission: RE | Admit: 2015-03-05 | Discharge: 2015-03-05 | Disposition: A | Discharge status: Home / Self Care | Payer: Managed Care, Other (non HMO) | Source: Ambulatory Visit | Attending: Internal Medicine | Admitting: Internal Medicine

## 2015-03-05 ENCOUNTER — Other Ambulatory Visit (HOSPITAL_COMMUNITY): Payer: Self-pay

## 2015-03-05 ENCOUNTER — Encounter (HOSPITAL_COMMUNITY): Payer: Self-pay

## 2015-03-05 VITALS — BP 154/90 | HR 71 | Wt 206.1 lb

## 2015-03-05 DIAGNOSIS — Z794 Long term (current) use of insulin: Secondary | ICD-10-CM | POA: Insufficient documentation

## 2015-03-05 DIAGNOSIS — I429 Cardiomyopathy, unspecified: Secondary | ICD-10-CM | POA: Insufficient documentation

## 2015-03-05 DIAGNOSIS — Z7982 Long term (current) use of aspirin: Secondary | ICD-10-CM | POA: Insufficient documentation

## 2015-03-05 DIAGNOSIS — Z8249 Family history of ischemic heart disease and other diseases of the circulatory system: Secondary | ICD-10-CM | POA: Insufficient documentation

## 2015-03-05 DIAGNOSIS — I2510 Atherosclerotic heart disease of native coronary artery without angina pectoris: Secondary | ICD-10-CM | POA: Diagnosis not present

## 2015-03-05 DIAGNOSIS — I129 Hypertensive chronic kidney disease with stage 1 through stage 4 chronic kidney disease, or unspecified chronic kidney disease: Secondary | ICD-10-CM | POA: Insufficient documentation

## 2015-03-05 DIAGNOSIS — R002 Palpitations: Secondary | ICD-10-CM | POA: Insufficient documentation

## 2015-03-05 DIAGNOSIS — I5022 Chronic systolic (congestive) heart failure: Secondary | ICD-10-CM

## 2015-03-05 DIAGNOSIS — E1122 Type 2 diabetes mellitus with diabetic chronic kidney disease: Secondary | ICD-10-CM | POA: Diagnosis not present

## 2015-03-05 DIAGNOSIS — Z79899 Other long term (current) drug therapy: Secondary | ICD-10-CM | POA: Insufficient documentation

## 2015-03-05 DIAGNOSIS — E785 Hyperlipidemia, unspecified: Secondary | ICD-10-CM | POA: Insufficient documentation

## 2015-03-05 DIAGNOSIS — G4733 Obstructive sleep apnea (adult) (pediatric): Secondary | ICD-10-CM | POA: Insufficient documentation

## 2015-03-05 DIAGNOSIS — R079 Chest pain, unspecified: Secondary | ICD-10-CM | POA: Insufficient documentation

## 2015-03-05 DIAGNOSIS — I159 Secondary hypertension, unspecified: Secondary | ICD-10-CM

## 2015-03-05 DIAGNOSIS — N189 Chronic kidney disease, unspecified: Secondary | ICD-10-CM | POA: Insufficient documentation

## 2015-03-05 DIAGNOSIS — Z833 Family history of diabetes mellitus: Secondary | ICD-10-CM | POA: Insufficient documentation

## 2015-03-05 DIAGNOSIS — Z9989 Dependence on other enabling machines and devices: Secondary | ICD-10-CM

## 2015-03-05 MED ORDER — SPIRONOLACTONE 25 MG PO TABS: 25.0000 mg | ORAL_TABLET | Freq: Every day | ORAL | Status: DC

## 2015-03-05 NOTE — Patient Instructions (Signed)
Follow up 6 weeks.  Do the following things EVERYDAY: 1) Weigh yourself in the morning before breakfast. Write it down and keep it in a log. 2) Take your medicines as prescribed 3) Eat low salt foods-Limit salt (sodium) to 2000 mg per day.  4) Stay as active as you can everyday 5) Limit all fluids for the day to less than 2 liters  

## 2015-03-05 NOTE — Progress Notes (Signed)
Advanced Heart Failure Medication Review by a Pharmacist  Does the patient  feel that his/her medications are working for him/her?  yes  Has the patient been experiencing any side effects to the medications prescribed?  no  Does the patient measure his/her own blood pressure or blood glucose at home?  no   Does the patient have any problems obtaining medications due to transportation or finances?   yes  Understanding of regimen: good Understanding of indications: good Potential of compliance: good    Pharmacist comments:  Misty Miller is a pleasant 54 yo F presenting without a medication list. Her medications were reconciled appropriately but she mentioned that some of her copays were exorbitantly high. I was able to secure copay assistance cards for her Bidil and Brilinta which should help with some of the cost. She did not have any other specific medication-related questions or concerns at this time.   Tyler Deis. Bonnye Fava, PharmD, BCPS, CPP Clinical Pharmacist Pager: [phone] Phone: [phone] 03/05/2015 12:36 PM

## 2015-03-05 NOTE — Progress Notes (Signed)
Patient ID: Misty Miller, female   DOB: [birth year], 54 y.o.   MRN: [id number] PCP: Dr. Wylene Simmer  54 yo with poorly controlled HTN and CHF presents for cardiology followup.  She has had HTN and diabetes for years.  She has had somewhat sporadic medical care due to economic issues (out of work).  She had been on BP meds in the past but had been rationing them due to finances.  She was admitted to [facility] in 2/16 with a hypertensive emergency and flash pulmonary edema. She had been off irbesartan for months.  She was initially given IV Lasix (just one dose) and started back on irbesartan.  Creatinine rose from 0.95 at admission to 2.71.  Irbesartan and Lasix were stopped.  Echo was done, showing EF 35-40% with basal inferior akinesis and mild to moderate MR.  Troponin was mildly elevated to peak 0.35.  Due to elevated creatinine, she did not have cardiac cath.  Lexiscan Cardiolite was done, showing EF 35% but no ischemia or infarction.  V/Q scan was normal.  BP was controlled and she was sent home.  Creatinine has improved and she is now on Lasix.    She returns for HF follow up. Says she having chest heaviness with exercise at cardiac rehab while using an arm machine. Evaluated in [facility] ED 8/15 and she was given nitro but had negative work up so she was discharged home. CE negative. Thought to have pulled muscle at rehab. Says chest pain/heaviness is dull in nature. Notices when she was grabbing clothes out of the washer.  (had stent placed June 14th). Since that time she has noticed intermittent dyspnea.  Weight at home 205 pounds. Taking all medications. Currently not working.   Labs (2/16): K 4.3, creatinine 0.95 => 2.71 => 1.78, TnI 0.35, Hgb 10, LDL 149, HCT 31, TSH normal, plasma aldosterone 1, urine catecholeamines normal.  Labs (3/16): K 4, creatinine 1.11 Labs (4/16): K 3.9, creatinine 1.09 Labs (12/24/14): K 4.1, creatinine 1.08, P2Y12 261 Labs 02/23/2015: K 3.6 Creatinine 0.90   PMH: 1. HTN: For  > 18 yrs, poor control historically. Renal artery dopplers showed no renal artery stenosis (2/16).   2. Type II diabetes. 3. 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  Admitted 2/16 with hypertensive emergency and pulmonary edema.  Echo (2/16) with EF 35-40%, basal inferior akinesis, mild-moderate MR.  Lexiscan Cardiolite (2/16) with EF 35%, no ischemia or infarction but LHC showed severe RCA disease, as below.    4. Hyperlipidemia 5. AKI/CKD: AKI in 2/16 in the setting of ARB and diuresis.  6. CAD: LHC (6/16) with 90% proximal RCA, 50% mRCA, 60% dRCA.  Promus DES to proximal RCA.  7. OSA: Moderate 8. Inadequate platelet inhibition with Plavix.  9. Palpitations: Holter in 4/16 showed primarily NSR, rare PVCs, with 10 beat run of AIVR.  SH: Married, unemployed, nonsmoker, no ETOH.   FH: Diabetes and HTN in multiple family members.  Grandfather MI at 13.  Mother with CHF, no history of MI.  ROS: All systems reviewed and negative except as per HPI.   Current Outpatient Prescriptions  Medication Sig Dispense Refill  . amLODipine (NORVASC) 5 MG tablet Take 5 mg by mouth daily.  3  . aspirin EC 81 MG EC tablet Take 1 tablet (81 mg total) by mouth daily.    Marland Kitchen atorvastatin (LIPITOR) 20 MG tablet Take 2 tablets (40 mg total) by mouth daily at 6 PM. 60 tablet 2  .  carvedilol (COREG) 25 MG tablet Take 25 mg by mouth 2 (two) times daily with a meal.    . cloNIDine (CATAPRES) 0.1 MG tablet Take 1 tablet (0.1 mg total) by mouth 2 (two) times daily. 180 tablet 1  . furosemide (LASIX) 40 MG tablet TAKE 1 TABLET BY MOUTH DAILY 30 tablet 3  . Insulin Degludec 200 UNIT/ML SOPN Inject 36 Units into the skin every morning. Take when blood glucose is >110    . isosorbide-hydrALAZINE (BIDIL) 20-37.5 MG per tablet Take 2 tablets by mouth 2 (two) times daily. 60 tablet 3  . spironolactone (ALDACTONE) 25 MG tablet Take 1 tablet (25 mg total) by mouth daily. 90 tablet 3  .  ticagrelor (BRILINTA) 90 MG TABS tablet Take 1 tablet (90 mg total) by mouth 2 (two) times daily. 60 tablet 11   No current facility-administered medications for this encounter.   BP 140/82 mmHg  Pulse 71  Wt 206 lb 2 oz (93.498 kg)  SpO2 99%  LMP  (LMP Unknown) General: NAD Mom present Neck: JVP flat no thyromegaly or thyroid nodule. Ambulated in the clinic without difficulty.   Lungs: Clear to auscultation bilaterally with normal respiratory effort. CV: Nondisplaced PMI.  Heart regular S1/S2, +S4, no murmur.  No peripheral edema.  No carotid bruit.  Normal pedal pulses.  Abdomen: Soft, nontender, no hepatosplenomegaly, no distention.  Skin: Intact without lesions or rashes.  Neurologic: Alert and oriented x 3.  Psych: Normal affect. Extremities: No clubbing or cyanosis.  HEENT: Normal.   Assessment/Plan: 1. Chronic systolic CHF: EF 01-31% by echo 08/17/14.  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  NYHA class II, euvolemic on exam.  - Continue current lasix, spironolactone, Coreg. - She is on Bidil 2 tabs tid. 2. CAD: LHC 12/23/14 showed severe proximal RCA stenosis treated with Promus DES.  She was a Plavix non-responder so was transitioned to Brilinta 90 mg bid.  Continue ASA 81 and atorvastatin 40.  Will refer to cardiac rehab.  3. HTN: BP elevated.  Renal artery dopplers normal.   - Continue current dose of coreg, bidil, amlodipine, and spironolactone. - Continue current dose of clonidine 0.1 mg BID   4. CKD: AKI at last admission after starting ARB, but fully recovered.  No evidence for renal artery stenosis on renal artery dopplers. - Suspect some baseline renal damage from long-standing HTN and diabetes.    Still holding off on ACEI.  Nephrology following. 5. Palpitations:  Holter in 4/16 showed primarily NSR, rare PVCs- this seems to have resolved.  6. OSA: using CPAP. 7. ? Chest pain- Reproducible. ? Muscular in nature not ischemic. Discussed with Dr  Shirlee Latch. Continue current plan. Hold off on cardiac rehab for now. Can consider in a couple of weeks.   Follow up in 6 weeks . She was instructed to call 911 if she has worsening chest pain.  CLEGG,AMY NP-C  03/05/2015

## 2015-03-06 ENCOUNTER — Encounter (HOSPITAL_COMMUNITY): Payer: Managed Care, Other (non HMO)

## 2015-03-09 ENCOUNTER — Telehealth (HOSPITAL_COMMUNITY): Payer: Self-pay | Admitting: *Deleted

## 2015-03-09 ENCOUNTER — Encounter (HOSPITAL_COMMUNITY): Admission: RE | Admit: 2015-03-09 | Payer: Managed Care, Other (non HMO) | Source: Ambulatory Visit

## 2015-03-09 NOTE — Telephone Encounter (Signed)
Left message for pt after continued absence.  Reviewed last office note at heart failure clinic.  Exercise on hold for "couple of weeks".  Wanted pt to know that rehab staff had reviewed office note and that if we could be of any assistance to pease call us.  Contact information left. Alanson Aly, BSN

## 2015-03-11 ENCOUNTER — Encounter (HOSPITAL_COMMUNITY): Payer: Managed Care, Other (non HMO)

## 2015-03-13 ENCOUNTER — Encounter (HOSPITAL_COMMUNITY): Admission: RE | Admit: 2015-03-13 | Payer: Managed Care, Other (non HMO) | Source: Ambulatory Visit

## 2015-03-18 ENCOUNTER — Encounter (HOSPITAL_COMMUNITY): Payer: Managed Care, Other (non HMO)

## 2015-03-18 ENCOUNTER — Encounter: Payer: Self-pay | Admitting: Cardiology

## 2015-03-20 ENCOUNTER — Telehealth (HOSPITAL_COMMUNITY): Payer: Self-pay | Admitting: Cardiology

## 2015-03-20 ENCOUNTER — Encounter (HOSPITAL_COMMUNITY): Payer: Managed Care, Other (non HMO)

## 2015-03-20 NOTE — Telephone Encounter (Signed)
Pt left voicemail requesting Brilinta samples   Left voicemail stating we do not keep keep samples she would need to contact [facility] as they should have samples Also advised to return call with any further questions

## 2015-03-23 ENCOUNTER — Telehealth (HOSPITAL_COMMUNITY): Payer: Self-pay | Admitting: Vascular Surgery

## 2015-03-23 ENCOUNTER — Encounter (HOSPITAL_COMMUNITY): Payer: Managed Care, Other (non HMO)

## 2015-03-23 NOTE — Telephone Encounter (Signed)
Left pt message to make f/u appt 

## 2015-03-25 ENCOUNTER — Encounter (HOSPITAL_COMMUNITY): Payer: Managed Care, Other (non HMO)

## 2015-03-27 ENCOUNTER — Encounter (HOSPITAL_COMMUNITY): Payer: Managed Care, Other (non HMO)

## 2015-03-27 DIAGNOSIS — Z0271 Encounter for disability determination: Secondary | ICD-10-CM

## 2015-03-30 ENCOUNTER — Encounter (HOSPITAL_COMMUNITY): Payer: Managed Care, Other (non HMO)

## 2015-04-01 ENCOUNTER — Encounter (HOSPITAL_COMMUNITY): Payer: Managed Care, Other (non HMO)

## 2015-04-03 ENCOUNTER — Encounter (HOSPITAL_COMMUNITY): Payer: Managed Care, Other (non HMO)

## 2015-04-06 ENCOUNTER — Ambulatory Visit: Payer: Managed Care, Other (non HMO) | Admitting: Cardiology

## 2015-04-06 ENCOUNTER — Encounter (HOSPITAL_COMMUNITY): Payer: Managed Care, Other (non HMO)

## 2015-04-08 ENCOUNTER — Encounter: Payer: Self-pay | Admitting: Cardiology

## 2015-04-08 ENCOUNTER — Encounter (HOSPITAL_COMMUNITY): Payer: Managed Care, Other (non HMO)

## 2015-04-08 DIAGNOSIS — G4733 Obstructive sleep apnea (adult) (pediatric): Secondary | ICD-10-CM | POA: Insufficient documentation

## 2015-04-08 DIAGNOSIS — E669 Obesity, unspecified: Secondary | ICD-10-CM | POA: Insufficient documentation

## 2015-04-08 HISTORY — DX: Obesity, unspecified: E66.9

## 2015-04-08 NOTE — Progress Notes (Signed)
Cardiology Office Note   Date:  04/09/2015   ID:  Stepahnie, Campo [birth year], MRN [id number]  PCP:  Gaspar Garbe, MD    Chief Complaint  Patient presents with  . OSA      History of Present Illness: Misty Miller is a 54 y.o. female who presents for evaluation of OSA.  SHe was referred for sleep study by Dr. Shirlee Latch for split night sleep study due to excessive daytime sleepiness, and snoring.  Her Epworth sleepiness score was elevated at 13.  She underwent PSG showing moderate OSA with an AHI of 27/hr with most events occurring during REM sleep in the non supine position.  Oxygen saturations dropped to 75% with respiratory events.  She underwent successful CPAP titration to 14cm H2O.  She now presents for followup.  She is doing well with her CPAP therapy.  She tolerates the CPAP device and feels the pressure is adequate.  Her husband says that once in a while she still snores.  She tolerates the full mask well with minimal mouth dryness or nasal congestion.  Since starting CPAP she is sleeping well at night, feels more rested in the am and has less daytime sleepiness.  Her d/l today showed an AHI of 1.1/hr on 14cm H2O and 63% compliance in using more than 4 hours nightly.  Her compliance dropped off due to forgetting to take her machine on a trip and then she got sick and could not use it.  She has been using it nightly recently and gets at least 6.5 hours on the machine nightly.      Past Medical History  Diagnosis Date  . Hypertension   . Coronary artery disease     cath 12/23/2014 single vessel dx, DES to RCA, moderate mid RCA residual  . Family history of adverse reaction to anesthesia     "it's hard to wake my mom after procedure is over"  . Hyperlipemia   . CHF (congestive heart failure) 08/16/2014  . Myocardial infarction 08/16/2014    "mild"  . Sleep apnea     "recently tested positive; no mask yet" (12/23/2014)  . Type II diabetes mellitus   .  History of blood transfusion 1992    S/P miscarriage  . Chronic kidney disease 08/16/2014  . Obesity (BMI 30-39.9) 04/08/2015  . OSA (obstructive sleep apnea)     moderate with an AHI of     Past Surgical History  Procedure Laterality Date  . Coronary angioplasty with stent placement  12/23/2014    "1"  . Cesarean section  06/1995  . Tumor excision Right 2000's    "fatty; upper leg"  . Cardiac catheterization N/A 12/23/2014    Procedure: Left Heart Cath and Coronary Angiography;  Surgeon: Laurey Morale, MD;  Location: [facility] INVASIVE CV LAB;  Service: Cardiovascular;  Laterality: N/A;  . Cardiac catheterization N/A 12/23/2014    Procedure: Intravascular Pressure Wire/FFR Study;  Surgeon: Lyn Records, MD;  Location: [facility] INVASIVE CV LAB;  Service: Cardiovascular;  Laterality: N/A;  . Cardiac catheterization N/A 12/23/2014    Procedure: Coronary Stent Intervention;  Surgeon: Lyn Records, MD;  Location: [facility] INVASIVE CV LAB;  Service: Cardiovascular;  Laterality: N/A;     Current Outpatient Prescriptions  Medication Sig Dispense Refill  . amLODipine (NORVASC) 5 MG tablet Take 5 mg by mouth daily.  3  . aspirin EC 81  MG EC tablet Take 1 tablet (81 mg total) by mouth daily.    Marland Kitchen atorvastatin (LIPITOR) 20 MG tablet Take 2 tablets (40 mg total) by mouth daily at 6 PM. 60 tablet 2  . carvedilol (COREG) 12.5 MG tablet Take 12.5 mg by mouth 2 (two) times daily.    . cloNIDine (CATAPRES) 0.1 MG tablet Take 1 tablet (0.1 mg total) by mouth 2 (two) times daily. 180 tablet 1  . furosemide (LASIX) 40 MG tablet TAKE 1 TABLET BY MOUTH DAILY 30 tablet 3  . Insulin Degludec 200 UNIT/ML SOPN Inject 36 Units into the skin every morning. Take when blood glucose is >110    . isosorbide-hydrALAZINE (BIDIL) 20-37.5 MG per tablet Take 2 tablets by mouth 2 (two) times daily. 60 tablet 3  . ONE TOUCH ULTRA TEST test strip USE TO TEST BLOOD SUGAR TWICE DAILY  3  . spironolactone (ALDACTONE) 25 MG tablet Take 1 tablet  (25 mg total) by mouth daily. 90 tablet 3  . ticagrelor (BRILINTA) 90 MG TABS tablet Take 1 tablet (90 mg total) by mouth 2 (two) times daily. 60 tablet 11   No current facility-administered medications for this visit.    Allergies:   Review of patient's allergies indicates no known allergies.    Social History:  The patient  reports that she quit smoking about 16 years ago. Her smoking use included Cigarettes. She has a 5 pack-year smoking history. She has never used smokeless tobacco. She reports that she drinks alcohol. She reports that she does not use illicit drugs.   Family History:  The patient's family history includes Cancer in her mother.    ROS:  Please see the history of present illness.   Otherwise, review of systems are positive for none.   All other systems are reviewed and negative.    PHYSICAL EXAM: VS:  BP 130/76 mmHg  Pulse 80  Ht 5' 3.25" (1.607 m)  Wt 212 lb 9.6 oz (96.435 kg)  BMI 37.34 kg/m2  SpO2 97%  LMP  (LMP Unknown) , BMI Body mass index is 37.34 kg/(m^2). GEN: Well nourished, well developed, in no acute distress HEENT: enlarged tonsils Neck: no JVD, carotid bruits, or masses Cardiac: RRR; no murmurs, rubs, or gallops,no edema  Respiratory:  clear to auscultation bilaterally, normal work of breathing GI: soft, nontender, nondistended, + BS MS: no deformity or atrophy Skin: warm and dry, no rash Neuro:  Strength and sensation are intact Psych: euthymic mood, full affect   EKG:  EKG is not ordered today.    Recent Labs: 08/16/2014: TSH 1.122 08/17/2014: ALT 12 02/23/2015: B Natriuretic Peptide 17.8; BUN 16; Creatinine, Ser 0.90; Hemoglobin 11.9*; Platelets 273; Potassium 3.6; Sodium 138    Lipid Panel    Component Value Date/Time   CHOL 224* 08/16/2014 1645   TRIG 122 08/16/2014 1645   HDL 51 08/16/2014 1645   CHOLHDL 4.4 08/16/2014 1645   VLDL 24 08/16/2014 1645   LDLCALC 149* 08/16/2014 1645      Wt Readings from Last 3 Encounters:    04/09/15 212 lb 9.6 oz (96.435 kg)  03/05/15 206 lb 2 oz (93.498 kg)  02/23/15 208 lb 8 oz (94.575 kg)        ASSESSMENT AND PLAN:  1.  Moderate OSA with an AHI of 27/hr and successful CPAP titration to 14cm H2O.  On exam she appears to have enlarged tonsils and uvula but exam was difficult due to poor visualization.  I will refer  her to ENT for further evaluation.  She is tolerating her device well.  Her compliance dropped off due to forgetting to take her machine on a trip and then she got sick and could not use it.  She has been using it nightly recently and gets at least 6.5 hours on the machine nightly.   Patient has been using and benefiting from CPAP use and will continue to benefit from therapy.  We discussed the need to be compliant in using her device at least 4 hours nightly.   I have also instructed the patient on proper sleep hygiene, avoidance of sleeping in the supine position and avoidance of alcohol within 4 hours of bedtime.  The patient was also instructed to avoid driving if sleepy.   2.  HTN - controlled on amlodipine/Coreg/Catapres/Bidil 3.  Obesity - she has held off on any exercise recently due to DOE and is going to see Dr. Shirlee Latch soon for further evaluation.      Current medicines are reviewed at length with the patient today.  The patient does not have concerns regarding medicines.  The following changes have been made:  no change  Labs/ tests ordered today: See above Assessment and Plan No orders of the defined types were placed in this encounter.     Disposition:   FU with me in 6 months  Signed, Quintella Reichert, MD  04/09/2015 10:48 AM    [facility] [address] [place], [place], [place]  [address] Phone: [phone]; Fax: [phone]

## 2015-04-09 ENCOUNTER — Ambulatory Visit (INDEPENDENT_AMBULATORY_CARE_PROVIDER_SITE_OTHER): Payer: Managed Care, Other (non HMO) | Admitting: Cardiology

## 2015-04-09 ENCOUNTER — Encounter: Payer: Self-pay | Admitting: Cardiology

## 2015-04-09 VITALS — BP 130/76 | HR 80 | Ht 63.25 in | Wt 212.6 lb

## 2015-04-09 DIAGNOSIS — I10 Essential (primary) hypertension: Secondary | ICD-10-CM | POA: Diagnosis not present

## 2015-04-09 DIAGNOSIS — G4733 Obstructive sleep apnea (adult) (pediatric): Secondary | ICD-10-CM

## 2015-04-09 DIAGNOSIS — E669 Obesity, unspecified: Secondary | ICD-10-CM | POA: Diagnosis not present

## 2015-04-09 DIAGNOSIS — Z9989 Dependence on other enabling machines and devices: Secondary | ICD-10-CM

## 2015-04-09 NOTE — Patient Instructions (Addendum)
Medication Instructions:  Your physician recommends that you continue on your current medications as directed. Please refer to the Current Medication list given to you today.   Labwork: None  Testing/Procedures: None  Follow-Up: You have been referred to Dr. Lazarus Salines for OSA.  Your physician wants you to follow-up in: 6 months with Dr. Mayford Knife. You will receive a reminder letter in the mail two months in advance. If you don't receive a letter, please call our office to schedule the follow-up appointment.   Any Other Special Instructions Will Be Listed Below (If Applicable).

## 2015-04-09 NOTE — Addendum Note (Signed)
Addended by: Gunnar Fusi A on: 04/09/2015 11:07 AM   Modules accepted: Orders

## 2015-04-10 ENCOUNTER — Encounter (HOSPITAL_COMMUNITY): Payer: Managed Care, Other (non HMO)

## 2015-04-13 ENCOUNTER — Encounter (HOSPITAL_COMMUNITY): Payer: Managed Care, Other (non HMO)

## 2015-04-15 ENCOUNTER — Encounter (HOSPITAL_COMMUNITY): Payer: Managed Care, Other (non HMO)

## 2015-04-16 ENCOUNTER — Other Ambulatory Visit: Payer: Self-pay | Admitting: *Deleted

## 2015-04-16 MED ORDER — FUROSEMIDE 40 MG PO TABS: 40.0000 mg | ORAL_TABLET | Freq: Every day | ORAL | Status: DC

## 2015-04-17 ENCOUNTER — Encounter (HOSPITAL_COMMUNITY): Admission: RE | Admit: 2015-04-17 | Payer: Managed Care, Other (non HMO) | Source: Ambulatory Visit

## 2015-04-20 ENCOUNTER — Encounter (HOSPITAL_COMMUNITY): Payer: Managed Care, Other (non HMO)

## 2015-04-22 ENCOUNTER — Encounter (HOSPITAL_COMMUNITY): Payer: Managed Care, Other (non HMO)

## 2015-04-24 ENCOUNTER — Ambulatory Visit (HOSPITAL_COMMUNITY)
Admission: RE | Admit: 2015-04-24 | Discharge: 2015-04-24 | Disposition: A | Discharge status: Home / Self Care | Payer: Managed Care, Other (non HMO) | Source: Ambulatory Visit | Attending: Cardiology | Admitting: Cardiology

## 2015-04-24 ENCOUNTER — Encounter (HOSPITAL_COMMUNITY): Payer: Managed Care, Other (non HMO)

## 2015-04-24 VITALS — BP 142/76 | HR 81 | Wt 211.2 lb

## 2015-04-24 DIAGNOSIS — N183 Chronic kidney disease, stage 3 (moderate): Secondary | ICD-10-CM | POA: Diagnosis not present

## 2015-04-24 DIAGNOSIS — I2510 Atherosclerotic heart disease of native coronary artery without angina pectoris: Secondary | ICD-10-CM | POA: Insufficient documentation

## 2015-04-24 DIAGNOSIS — N189 Chronic kidney disease, unspecified: Secondary | ICD-10-CM | POA: Insufficient documentation

## 2015-04-24 DIAGNOSIS — I130 Hypertensive heart and chronic kidney disease with heart failure and stage 1 through stage 4 chronic kidney disease, or unspecified chronic kidney disease: Secondary | ICD-10-CM | POA: Insufficient documentation

## 2015-04-24 DIAGNOSIS — I5022 Chronic systolic (congestive) heart failure: Secondary | ICD-10-CM

## 2015-04-24 DIAGNOSIS — G4733 Obstructive sleep apnea (adult) (pediatric): Secondary | ICD-10-CM | POA: Diagnosis not present

## 2015-04-24 DIAGNOSIS — R002 Palpitations: Secondary | ICD-10-CM | POA: Diagnosis not present

## 2015-04-24 LAB — BASIC METABOLIC PANEL
Anion gap: 8 (ref 5–15)
BUN: 17 mg/dL (ref 6–20)
CO2: 27 mmol/L (ref 22–32)
Calcium: 9 mg/dL (ref 8.9–10.3)
Chloride: 101 mmol/L (ref 101–111)
Creatinine, Ser: 1.03 mg/dL — ABNORMAL HIGH (ref 0.44–1.00)
GFR calc Af Amer: >60 mL/min (ref >60)
GFR calc non Af Amer: >60 mL/min (ref >60)
Glucose, Bld: 325 mg/dL — ABNORMAL HIGH (ref 65–99)
Potassium: 4.1 mmol/L (ref 3.5–5.1)
Sodium: 136 mmol/L (ref 135–145)

## 2015-04-24 LAB — BRAIN NATRIURETIC PEPTIDE: B Natriuretic Peptide: 14.7 pg/mL (ref 0.0–100.0)

## 2015-04-24 MED ORDER — TICAGRELOR 90 MG PO TABS: 90.0000 mg | ORAL_TABLET | Freq: Two times a day (BID) | ORAL | Status: DC

## 2015-04-24 MED ORDER — FUROSEMIDE 40 MG PO TABS: ORAL_TABLET | ORAL | Status: DC

## 2015-04-24 MED ORDER — SPIRONOLACTONE 50 MG PO TABS: 50.0000 mg | ORAL_TABLET | Freq: Every day | ORAL | Status: DC

## 2015-04-24 NOTE — Patient Instructions (Signed)
Increase Furosemide (Lasix) to 40 mg (1 tab) in AM and 20 mg (1/2 tab) in PM  Increase Spironolactone to 50 mg daily, you can take 2 of your 25 mg tabs until you run out, THEN we have sent you in a prescription for 50 mg tablets  Labs today  Labs in 10 days  Your physician recommends that you schedule a follow-up appointment in: 1 month with Dr Shirlee Latch

## 2015-04-24 NOTE — Progress Notes (Signed)
ReDs vest score 34

## 2015-04-25 NOTE — Progress Notes (Signed)
Patient ID: Misty Miller, female   DOB: [birth year], 54 y.o.   MRN: [id number] PCP: Dr. Wylene Simmer  54 yo with poorly controlled HTN and CHF presents for cardiology followup.  She has had HTN and diabetes for years.  She has had somewhat sporadic medical care due to economic issues (out of work).  She had been on BP meds in the past but had been rationing them due to finances.  She was admitted to [facility] in 2/16 with a hypertensive emergency and flash pulmonary edema. She had been off irbesartan for months.  She was initially given IV Lasix (just one dose) and started back on irbesartan.  Creatinine rose from 0.95 at admission to 2.71.  Irbesartan and Lasix were stopped.  Echo was done, showing EF 35-40% with basal inferior akinesis and mild to moderate MR.  Troponin was mildly elevated to peak 0.35.  Due to elevated creatinine, she did not have cardiac cath.  Lexiscan Cardiolite was done, showing EF 35% but no ischemia or infarction.  V/Q scan was normal.  BP was controlled and she was sent home.  Creatinine has improved and she is now on Lasix.    She returns for HF follow up. Stable symptomatically.  Occasional chest heaviness but atypical and rare.  Still has dyspnea, this is chronic.  She is short of breath after walking 100 feet.  She is short of breath with heavy lifting and housework.  +Bendopnea, no orthopnea.  SBP running in 130s typically at home.    Labs (2/16): K 4.3, creatinine 0.95 => 2.71 => 1.78, TnI 0.35, Hgb 10, LDL 149, HCT 31, TSH normal, plasma aldosterone 1, urine catecholeamines normal.  Labs (3/16): K 4, creatinine 1.11 Labs (4/16): K 3.9, creatinine 1.09 Labs (12/24/14): K 4.1, creatinine 1.08, P2Y12 261 Labs 02/23/2015: K 3.6 Creatinine 0.90, HCT 35.4, BNP 18  PMH: 1. HTN: For > 18 yrs, poor control historically. Renal artery dopplers showed no renal artery stenosis (2/16).   2. Type II diabetes. 3. Chronic systolic CHF: Suspect mixed hypertensive and ischemic  cardiomyopathy.  Admitted 2/16 with hypertensive emergency and pulmonary edema.  Echo (2/16) with EF 35-40%, basal inferior akinesis, mild-moderate MR.  Lexiscan Cardiolite (2/16) with EF 35%, no ischemia or infarction but LHC showed severe RCA disease, as below.    4. Hyperlipidemia 5. AKI/CKD: AKI in 2/16 in the setting of ARB and diuresis.  6. CAD: LHC (6/16) with 90% proximal RCA, 50% mRCA, 60% dRCA.  Promus DES to proximal RCA.  7. OSA: Moderate, using CPAP. 8. Inadequate platelet inhibition with Plavix.  9. Palpitations: Holter in 4/16 showed primarily NSR, rare PVCs, with 10 beat run of AIVR.  SH: Married, unemployed, nonsmoker, no ETOH.   FH: Diabetes and HTN in multiple family members.  Grandfather MI at 57.  Mother with CHF, no history of MI.  ROS: All systems reviewed and negative except as per HPI.   Current Outpatient Prescriptions  Medication Sig Dispense Refill  . amLODipine (NORVASC) 10 MG tablet Take 10 mg by mouth daily.    Marland Kitchen aspirin EC 81 MG EC tablet Take 1 tablet (81 mg total) by mouth daily.    Marland Kitchen atorvastatin (LIPITOR) 20 MG tablet Take 2 tablets (40 mg total) by mouth daily at 6 PM. 60 tablet 2  . carvedilol (COREG) 25 MG tablet Take 25 mg by mouth 2 (two) times daily with a meal.    . cloNIDine (CATAPRES) 0.1 MG tablet Take 1 tablet (0.1 mg  total) by mouth 2 (two) times daily. 180 tablet 1  . furosemide (LASIX) 40 MG tablet Take 1 tab in AM and 1/2 tab in PM 135 tablet 3  . Insulin Degludec 200 UNIT/ML SOPN Inject 36 Units into the skin every morning. Take when blood glucose is >110    . isosorbide-hydrALAZINE (BIDIL) 20-37.5 MG per tablet Take 2 tablets by mouth 2 (two) times daily. 60 tablet 3  . ONE TOUCH ULTRA TEST test strip USE TO TEST BLOOD SUGAR TWICE DAILY  3  . spironolactone (ALDACTONE) 50 MG tablet Take 1 tablet (50 mg total) by mouth daily. 90 tablet 3  . ticagrelor (BRILINTA) 90 MG TABS tablet Take 1 tablet (90 mg total) by mouth 2 (two) times daily.  180 tablet 3   No current facility-administered medications for this encounter.   BP 142/76 mmHg  Pulse 81  Wt 211 lb 4 oz (95.822 kg)  SpO2 99%  LMP  (LMP Unknown) General: NAD Mom present Neck: JVP 8 cm, no thyromegaly or thyroid nodule. Ambulated in the clinic without difficulty.   Lungs: Clear to auscultation bilaterally with normal respiratory effort. CV: Nondisplaced PMI.  Heart regular S1/S2, +S4, no murmur.  No peripheral edema.  No carotid bruit.  Normal pedal pulses.  Abdomen: Soft, nontender, no hepatosplenomegaly, no distention.  Skin: Intact without lesions or rashes.  Neurologic: Alert and oriented x 3.  Psych: Normal affect. Extremities: No clubbing or cyanosis.  HEENT: Normal.   Assessment/Plan: 1. Chronic systolic CHF: EF 11-17% by echo 08/17/14.  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  NYHA class III, possibly has some excess volume.  Weight is up 5 lbs.  - Increase Lasix to 40 mg bid x 3 days, then 40 qam/20 qpm after that.  BMET/BNP today and repeat in 10 days.   - She is on Bidil 2 tabs tid and Coreg 25 mg bid.  - Increase spironolactone to 50 mg daily. - I will consider trying a low dose ARB again in the future (had AKI after getting ARB at relatively high dose in the past).  - I will arrange for an echocardiogram.   2. CAD: LHC 12/23/14 showed severe proximal RCA stenosis treated with Promus DES.  She was a Plavix non-responder so was transitioned to Brilinta 90 mg bid.  Continue ASA 81 and atorvastatin 40.  She has some rare atypical chest pain.  Will refer to cardiac rehab.  3. HTN: SBP 130s at home.  Renal artery dopplers normal.    4. CKD: AKI at last admission after starting ARB, but fully recovered.  No evidence for renal artery stenosis on renal artery dopplers. - Suspect some baseline renal damage from long-standing HTN and diabetes.    5. Palpitations:  Holter in 4/16 showed primarily NSR, rare PVCs- this seems to have resolved.  6.  OSA: using CPAP.  [name] 04/25/2015

## 2015-04-27 ENCOUNTER — Encounter (HOSPITAL_COMMUNITY): Payer: Managed Care, Other (non HMO)

## 2015-04-29 ENCOUNTER — Encounter (HOSPITAL_COMMUNITY): Payer: Managed Care, Other (non HMO)

## 2015-05-01 ENCOUNTER — Encounter (HOSPITAL_COMMUNITY): Payer: Managed Care, Other (non HMO)

## 2015-05-04 ENCOUNTER — Encounter (HOSPITAL_COMMUNITY): Payer: Managed Care, Other (non HMO)

## 2015-05-06 ENCOUNTER — Other Ambulatory Visit (HOSPITAL_COMMUNITY): Payer: Managed Care, Other (non HMO)

## 2015-05-06 ENCOUNTER — Encounter (HOSPITAL_COMMUNITY): Payer: Managed Care, Other (non HMO)

## 2015-05-08 ENCOUNTER — Encounter: Payer: Self-pay | Admitting: Cardiology

## 2015-05-08 ENCOUNTER — Encounter (HOSPITAL_COMMUNITY): Payer: Managed Care, Other (non HMO)

## 2015-05-11 ENCOUNTER — Encounter (HOSPITAL_COMMUNITY): Payer: Managed Care, Other (non HMO)

## 2015-05-13 ENCOUNTER — Encounter (HOSPITAL_COMMUNITY): Payer: Managed Care, Other (non HMO)

## 2015-05-15 ENCOUNTER — Encounter (HOSPITAL_COMMUNITY): Payer: Managed Care, Other (non HMO)

## 2015-05-19 ENCOUNTER — Ambulatory Visit: Payer: Managed Care, Other (non HMO) | Admitting: Gastroenterology

## 2015-05-20 ENCOUNTER — Ambulatory Visit: Payer: Managed Care, Other (non HMO) | Admitting: Physician Assistant

## 2015-05-25 ENCOUNTER — Ambulatory Visit (HOSPITAL_COMMUNITY): Payer: Managed Care, Other (non HMO)

## 2015-05-25 ENCOUNTER — Encounter (HOSPITAL_COMMUNITY): Payer: Managed Care, Other (non HMO)

## 2015-09-19 ENCOUNTER — Other Ambulatory Visit: Payer: Self-pay | Admitting: Cardiology

## 2015-12-04 ENCOUNTER — Ambulatory Visit (HOSPITAL_BASED_OUTPATIENT_CLINIC_OR_DEPARTMENT_OTHER)
Admission: RE | Admit: 2015-12-04 | Discharge: 2015-12-04 | Disposition: A | Discharge status: Home / Self Care | Payer: Managed Care, Other (non HMO) | Source: Ambulatory Visit | Attending: Cardiology | Admitting: Cardiology

## 2015-12-04 ENCOUNTER — Other Ambulatory Visit (HOSPITAL_COMMUNITY): Payer: Self-pay | Admitting: Cardiology

## 2015-12-04 ENCOUNTER — Ambulatory Visit (HOSPITAL_COMMUNITY)
Admission: RE | Admit: 2015-12-04 | Discharge: 2015-12-04 | Disposition: A | Discharge status: Home / Self Care | Payer: Managed Care, Other (non HMO) | Source: Ambulatory Visit | Attending: Internal Medicine | Admitting: Internal Medicine

## 2015-12-04 ENCOUNTER — Encounter (HOSPITAL_COMMUNITY): Payer: Self-pay

## 2015-12-04 VITALS — BP 130/84 | HR 68 | Wt 214.8 lb

## 2015-12-04 DIAGNOSIS — G4733 Obstructive sleep apnea (adult) (pediatric): Secondary | ICD-10-CM | POA: Diagnosis not present

## 2015-12-04 DIAGNOSIS — Z794 Long term (current) use of insulin: Secondary | ICD-10-CM | POA: Diagnosis not present

## 2015-12-04 DIAGNOSIS — E669 Obesity, unspecified: Secondary | ICD-10-CM | POA: Insufficient documentation

## 2015-12-04 DIAGNOSIS — I509 Heart failure, unspecified: Secondary | ICD-10-CM | POA: Diagnosis not present

## 2015-12-04 DIAGNOSIS — I10 Essential (primary) hypertension: Secondary | ICD-10-CM

## 2015-12-04 DIAGNOSIS — I130 Hypertensive heart and chronic kidney disease with heart failure and stage 1 through stage 4 chronic kidney disease, or unspecified chronic kidney disease: Secondary | ICD-10-CM | POA: Diagnosis not present

## 2015-12-04 DIAGNOSIS — Z79899 Other long term (current) drug therapy: Secondary | ICD-10-CM | POA: Diagnosis not present

## 2015-12-04 DIAGNOSIS — Z6837 Body mass index (BMI) 37.0-37.9, adult: Secondary | ICD-10-CM | POA: Insufficient documentation

## 2015-12-04 DIAGNOSIS — N189 Chronic kidney disease, unspecified: Secondary | ICD-10-CM | POA: Insufficient documentation

## 2015-12-04 DIAGNOSIS — E785 Hyperlipidemia, unspecified: Secondary | ICD-10-CM | POA: Insufficient documentation

## 2015-12-04 DIAGNOSIS — Z7982 Long term (current) use of aspirin: Secondary | ICD-10-CM | POA: Diagnosis not present

## 2015-12-04 DIAGNOSIS — I340 Nonrheumatic mitral (valve) insufficiency: Secondary | ICD-10-CM | POA: Diagnosis not present

## 2015-12-04 DIAGNOSIS — E1122 Type 2 diabetes mellitus with diabetic chronic kidney disease: Secondary | ICD-10-CM | POA: Diagnosis not present

## 2015-12-04 DIAGNOSIS — I2510 Atherosclerotic heart disease of native coronary artery without angina pectoris: Secondary | ICD-10-CM

## 2015-12-04 DIAGNOSIS — I5022 Chronic systolic (congestive) heart failure: Secondary | ICD-10-CM | POA: Diagnosis not present

## 2015-12-04 LAB — BASIC METABOLIC PANEL
Anion gap: 8 (ref 5–15)
BUN: 11 mg/dL (ref 6–20)
CO2: 28 mmol/L (ref 22–32)
Calcium: 9.4 mg/dL (ref 8.9–10.3)
Chloride: 102 mmol/L (ref 101–111)
Creatinine, Ser: 0.93 mg/dL (ref 0.44–1.00)
GFR calc Af Amer: >60 mL/min (ref >60)
GFR calc non Af Amer: >60 mL/min (ref >60)
Glucose, Bld: 104 mg/dL — ABNORMAL HIGH (ref 65–99)
Potassium: 3.7 mmol/L (ref 3.5–5.1)
Sodium: 138 mmol/L (ref 135–145)

## 2015-12-04 LAB — LIPID PANEL
Cholesterol: 225 mg/dL — ABNORMAL HIGH (ref 0–200)
HDL: 41 mg/dL (ref >40)
LDL Cholesterol: 149 mg/dL — ABNORMAL HIGH (ref 0–99)
Total CHOL/HDL Ratio: 5.5 RATIO
Triglycerides: 177 mg/dL — ABNORMAL HIGH (ref <150)
VLDL: 35 mg/dL (ref 0–40)

## 2015-12-04 MED ORDER — TICAGRELOR 60 MG PO TABS: 60.0000 mg | ORAL_TABLET | Freq: Two times a day (BID) | ORAL | Status: DC

## 2015-12-04 NOTE — Progress Notes (Signed)
  Echocardiogram 2D Echocardiogram has been performed.  Arvil Chaco 12/04/2015, 9:51 AM

## 2015-12-04 NOTE — Patient Instructions (Addendum)
Decrease Brillinta 60 mg Twice daily   Labs today  We will contact you in 6 months to schedule your next appointment.

## 2015-12-05 NOTE — Progress Notes (Signed)
Patient ID: Misty Miller, female   DOB: [birth year], 55 y.o.   MRN: [id number] PCP: Dr. Wylene Simmer  55 yo with poorly controlled HTN and CHF presents for cardiology followup.  She has had HTN and diabetes for years.  She has had somewhat sporadic medical care due to economic issues (out of work).  She had been on BP meds in the past but had been rationing them due to finances.  She was admitted to [facility] in 2/16 with a hypertensive emergency and flash pulmonary edema. She had been off irbesartan for months.  She was initially given IV Lasix (just one dose) and started back on irbesartan.  Creatinine rose from 0.95 at admission to 2.71.  Irbesartan and Lasix were stopped.  Echo was done, showing EF 35-40% with basal inferior akinesis and mild to moderate MR.  Troponin was mildly elevated to peak 0.35.  Due to elevated creatinine, she did not have cardiac cath.  Lexiscan Cardiolite was done, showing EF 35% but no ischemia or infarction.  V/Q scan was normal.  BP was controlled and she was sent home.  Creatinine has improved and she is now off Lasix.    She returns for HF follow up. No chest pain or exertional dyspnea now.  No orthopnea/PND.  No BRBPR or melena.  SBP running 120s-130s at home, 130/84 today.   Echo today showed improvement in EF back to 55%.   Labs (2/16): K 4.3, creatinine 0.95 => 2.71 => 1.78, TnI 0.35, Hgb 10, LDL 149, HCT 31, TSH normal, plasma aldosterone 1, urine catecholeamines normal.  Labs (3/16): K 4, creatinine 1.11 Labs (4/16): K 3.9, creatinine 1.09 Labs (12/24/14): K 4.1, creatinine 1.08, P2Y12 261 Labs 02/23/2015: K 3.6 Creatinine 0.90, HCT 35.4, BNP 18 Labs (10/16): K 4.1, creatinine 1.03, BNP 14  PMH: 1. HTN: For > 18 yrs, poor control historically. Renal artery dopplers showed no renal artery stenosis (2/16).   2. Type II diabetes. 3. 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  Admitted 2/16 with hypertensive emergency and pulmonary  edema.  Echo (2/16) with EF 35-40%, basal inferior akinesis, mild-moderate MR.  Lexiscan Cardiolite (2/16) with EF 35%, no ischemia or infarction but LHC showed severe RCA disease, as below.    - Echo (5/17): EF 55%, mild LVH.  4. Hyperlipidemia 5. AKI/CKD: AKI in 2/16 in the setting of ARB and diuresis.  6. CAD: LHC (6/16) with 90% proximal RCA, 50% mRCA, 60% dRCA.  Promus DES to proximal RCA.  7. OSA: Moderate, using CPAP. 8. Inadequate platelet inhibition with Plavix.  9. Palpitations: Holter in 4/16 showed primarily NSR, rare PVCs, with 10 beat run of AIVR.  SH: Married, unemployed, nonsmoker, no ETOH.   FH: Diabetes and HTN in multiple family members.  Grandfather MI at 29.  Mother with CHF, no history of MI.  ROS: All systems reviewed and negative except as per HPI.   Current Outpatient Prescriptions  Medication Sig Dispense Refill  . amLODipine (NORVASC) 10 MG tablet Take 10 mg by mouth daily.    Marland Kitchen aspirin EC 81 MG EC tablet Take 1 tablet (81 mg total) by mouth daily.    Marland Kitchen atorvastatin (LIPITOR) 20 MG tablet Take 2 tablets (40 mg total) by mouth daily at 6 PM. 60 tablet 2  . carvedilol (COREG) 25 MG tablet Take 25 mg by mouth 2 (two) times daily with a meal.    . cloNIDine (CATAPRES) 0.1 MG tablet TAKE 1 TABLET (0.1 MG TOTAL)  BY MOUTH 2 (TWO) TIMES DAILY. 180 tablet 1  . Insulin Degludec 200 UNIT/ML SOPN Inject 36 Units into the skin every morning. Take when blood glucose is >110    . isosorbide-hydrALAZINE (BIDIL) 20-37.5 MG per tablet Take 2 tablets by mouth 2 (two) times daily. 60 tablet 3  . ONE TOUCH ULTRA TEST test strip USE TO TEST BLOOD SUGAR TWICE DAILY  3  . spironolactone (ALDACTONE) 50 MG tablet Take 1 tablet (50 mg total) by mouth daily. 90 tablet 3  . ticagrelor (BRILINTA) 60 MG TABS tablet Take 1 tablet (60 mg total) by mouth 2 (two) times daily. 60 tablet 6   No current facility-administered medications for this encounter.   BP 130/84 mmHg  Pulse 68  Wt 214 lb  12 oz (97.41 kg)  SpO2 98%  LMP  (LMP Unknown) General: NAD. Neck: JVP 7 cm, no thyromegaly or thyroid nodule. Ambulated in the clinic without difficulty.   Lungs: Clear to auscultation bilaterally with normal respiratory effort. CV: Nondisplaced PMI.  Heart regular S1/S2, +S4, no murmur.  No peripheral edema.  No carotid bruit.  Normal pedal pulses.  Abdomen: Soft, nontender, no hepatosplenomegaly, no distention.  Skin: Intact without lesions or rashes.  Neurologic: Alert and oriented x 3.  Psych: Normal affect. Extremities: No clubbing or cyanosis.  HEENT: Normal.   Assessment/Plan: 1. Chronic systolic CHF: EF 16-10% by echo 08/17/14.  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  HTN has been treated, and EF on 5/17 echo was improved back to 55% (reviewed today). - No indication for Lasix.   - She is on Bidil 2 tabs tid and Coreg 25 mg bid.  - Continue spironolactone 50 mg daily. BMET today.   2. CAD: LHC 12/23/14 showed severe proximal RCA stenosis treated with Promus DES.  She was a Plavix non-responder so was transitioned to Brilinta 90 mg bid.  Continue ASA 81 and atorvastatin 40.  No chest pain.  - DAPT score = 3.  At the end of 6/17, can decrease ticagrelor to 60 mg bid.   3. HTN: SBP 120s-130s at home, improved.  Renal artery dopplers normal.    4. CKD: AKI at last admission after starting ARB, but fully recovered.  No evidence for renal artery stenosis on renal artery dopplers. - Suspect some baseline renal damage from long-standing HTN and diabetes.    5. Palpitations:  Holter in 4/16 showed primarily NSR, rare PVCs- this seems to have resolved.  6. OSA: using CPAP.  Followup in 6 months, but given spironolactone use will need BMET again in 3 months.   Misty Miller 12/05/2015

## 2015-12-10 ENCOUNTER — Telehealth (HOSPITAL_COMMUNITY): Payer: Self-pay | Admitting: *Deleted

## 2015-12-10 NOTE — Telephone Encounter (Signed)
Notes Recorded by Modesta Messing, CMA on 12/10/2015 at 10:05 AM Left another message for pt to call for lab results. Will try again this afternoon if no response will mail letter. Notes Recorded by Modesta Messing, CMA on 12/04/2015 at 3:39 PM Left message for pt to call back for lab results Notes Recorded by Laurey Morale, MD on 12/04/2015 at 11:45 AM LDL way too high. Is she taking atorvastatin 40 mg daily? If so, stop atorvastatin and start rosuvastatin 40 mg daily with lipids/LFTs in 2 months.       Ref Range 6d ago  49yr ago    Cholesterol 0 - 200 mg/dL 935 (H) 521 (H)   Triglycerides <150 mg/dL 747 (H) 159   HDL >53 mg/dL 41 39 mg/dL" XYDSW="VT_9N" style="cursor: pointer; background-color: rgb(228, 223, 214);" onmouseover='jscript: var varStyle="underline"; var bgColor="#[id number]"; this.style.backgroundColor=bgColor; var children=this.getElementsByTagName("div"); for(var child=0;child 51R   Total CHOL/HDL Ratio RATIO 5.5 4.4   VLDL 0 - 40 mg/dL 35 24   LDL Cholesterol 0 - 99 mg/dL 504 (H) 136 (H)CM

## 2015-12-11 ENCOUNTER — Encounter (HOSPITAL_COMMUNITY): Payer: Self-pay | Admitting: Cardiology

## 2015-12-17 NOTE — Telephone Encounter (Signed)
chantel mailed letter 6/2

## 2015-12-31 ENCOUNTER — Other Ambulatory Visit (HOSPITAL_COMMUNITY): Payer: Self-pay | Admitting: *Deleted

## 2015-12-31 MED ORDER — TICAGRELOR 60 MG PO TABS: 60.0000 mg | ORAL_TABLET | Freq: Two times a day (BID) | ORAL | Status: DC

## 2016-03-06 ENCOUNTER — Other Ambulatory Visit (HOSPITAL_COMMUNITY): Payer: Self-pay | Admitting: Adult Health

## 2016-03-25 ENCOUNTER — Encounter: Payer: Self-pay | Admitting: Pharmacist

## 2016-03-25 LAB — GLUCOSE, POCT (MANUAL RESULT ENTRY): POC Glucose: 280 mg/dl — AB (ref 70–99)

## 2016-03-25 NOTE — Progress Notes (Unsigned)
Patient seen today at health event at [facility] by Lavinia Sharps, NP.   BP and BG (280) elevated today, no signs/symptoms of concern, patient reports being out of medications due to cost. Has insurance, advised to contact Clinic and pharmacy for assistance with medication access. Clonidine 0.1 mg (1 tablet BID), #28 tablets provided today. Provided phone numbers for medication resources. Education provided on healthy diet/exercise. Patient verbalized understanding by repeat-back.

## 2016-04-19 ENCOUNTER — Other Ambulatory Visit: Payer: Self-pay | Admitting: Cardiology

## 2016-08-13 ENCOUNTER — Other Ambulatory Visit: Payer: Self-pay | Admitting: Cardiology

## 2016-08-16 ENCOUNTER — Telehealth (HOSPITAL_COMMUNITY): Payer: Self-pay | Admitting: *Deleted

## 2016-08-16 ENCOUNTER — Telehealth (HOSPITAL_COMMUNITY): Payer: Self-pay | Admitting: Vascular Surgery

## 2016-08-16 ENCOUNTER — Other Ambulatory Visit (HOSPITAL_COMMUNITY): Payer: Self-pay | Admitting: *Deleted

## 2016-08-16 NOTE — Telephone Encounter (Signed)
Pt called .Marland Kitchen Needs refill of Furosemide 40 mg sent to [facility]

## 2016-08-16 NOTE — Telephone Encounter (Signed)
Patient called requesting a refill of furosemide. Looked at patients last office visit on 5/17 and she was no longer supposed to be taking lasix.  Patient told me her pharmacy has continued to fill her prescription and shes been taking it.  Patient does not have an appt until March. I asked patient to come in tomorrow for a bmet before I refill the script. Explained to patient while on this medication we have to monitor her kidneys and potassium.  Patient stated she understood and would come for labs tomorrow 2/7 at 9:00am.  Message routed to Dr.McLean for further advise.

## 2016-08-17 ENCOUNTER — Other Ambulatory Visit (HOSPITAL_COMMUNITY): Payer: Self-pay | Admitting: *Deleted

## 2016-08-17 ENCOUNTER — Ambulatory Visit (HOSPITAL_COMMUNITY)
Admission: RE | Admit: 2016-08-17 | Discharge: 2016-08-17 | Disposition: A | Discharge status: Home / Self Care | Payer: Managed Care, Other (non HMO) | Source: Ambulatory Visit | Attending: Internal Medicine | Admitting: Internal Medicine

## 2016-08-17 DIAGNOSIS — I5022 Chronic systolic (congestive) heart failure: Secondary | ICD-10-CM

## 2016-08-17 LAB — BASIC METABOLIC PANEL
Anion gap: 10 (ref 5–15)
BUN: 19 mg/dL (ref 6–20)
CO2: 26 mmol/L (ref 22–32)
Calcium: 9.7 mg/dL (ref 8.9–10.3)
Chloride: 98 mmol/L — ABNORMAL LOW (ref 101–111)
Creatinine, Ser: 1.09 mg/dL — ABNORMAL HIGH (ref 0.44–1.00)
GFR calc Af Amer: >60 mL/min (ref >60)
GFR calc non Af Amer: 56 mL/min — ABNORMAL LOW (ref >60)
Glucose, Bld: 442 mg/dL — ABNORMAL HIGH (ref 65–99)
Potassium: 4.1 mmol/L (ref 3.5–5.1)
Sodium: 134 mmol/L — ABNORMAL LOW (ref 135–145)

## 2016-08-17 MED ORDER — TICAGRELOR 60 MG PO TABS: 60.0000 mg | ORAL_TABLET | Freq: Two times a day (BID) | ORAL | 6 refills | Status: DC

## 2016-08-18 ENCOUNTER — Other Ambulatory Visit (HOSPITAL_COMMUNITY): Payer: Self-pay | Admitting: *Deleted

## 2016-08-18 MED ORDER — FUROSEMIDE 40 MG PO TABS: 40.0000 mg | ORAL_TABLET | Freq: Every day | ORAL | 3 refills | Status: DC

## 2016-09-15 ENCOUNTER — Encounter (HOSPITAL_COMMUNITY): Payer: Self-pay

## 2016-09-15 ENCOUNTER — Ambulatory Visit (HOSPITAL_COMMUNITY)
Admission: RE | Admit: 2016-09-15 | Discharge: 2016-09-15 | Disposition: A | Discharge status: Home / Self Care | Payer: Managed Care, Other (non HMO) | Source: Ambulatory Visit | Attending: Cardiology | Admitting: Cardiology

## 2016-09-15 VITALS — BP 154/94 | HR 79 | Wt 203.0 lb

## 2016-09-15 DIAGNOSIS — Z8249 Family history of ischemic heart disease and other diseases of the circulatory system: Secondary | ICD-10-CM | POA: Diagnosis not present

## 2016-09-15 DIAGNOSIS — N179 Acute kidney failure, unspecified: Secondary | ICD-10-CM | POA: Insufficient documentation

## 2016-09-15 DIAGNOSIS — G4733 Obstructive sleep apnea (adult) (pediatric): Secondary | ICD-10-CM | POA: Insufficient documentation

## 2016-09-15 DIAGNOSIS — N189 Chronic kidney disease, unspecified: Secondary | ICD-10-CM | POA: Diagnosis not present

## 2016-09-15 DIAGNOSIS — Z7982 Long term (current) use of aspirin: Secondary | ICD-10-CM | POA: Insufficient documentation

## 2016-09-15 DIAGNOSIS — Z7902 Long term (current) use of antithrombotics/antiplatelets: Secondary | ICD-10-CM | POA: Insufficient documentation

## 2016-09-15 DIAGNOSIS — R002 Palpitations: Secondary | ICD-10-CM | POA: Diagnosis not present

## 2016-09-15 DIAGNOSIS — E1122 Type 2 diabetes mellitus with diabetic chronic kidney disease: Secondary | ICD-10-CM | POA: Diagnosis not present

## 2016-09-15 DIAGNOSIS — I493 Ventricular premature depolarization: Secondary | ICD-10-CM | POA: Diagnosis not present

## 2016-09-15 DIAGNOSIS — E785 Hyperlipidemia, unspecified: Secondary | ICD-10-CM | POA: Diagnosis not present

## 2016-09-15 DIAGNOSIS — I5041 Acute combined systolic (congestive) and diastolic (congestive) heart failure: Secondary | ICD-10-CM

## 2016-09-15 DIAGNOSIS — Z833 Family history of diabetes mellitus: Secondary | ICD-10-CM | POA: Insufficient documentation

## 2016-09-15 DIAGNOSIS — I10 Essential (primary) hypertension: Secondary | ICD-10-CM

## 2016-09-15 DIAGNOSIS — Z79899 Other long term (current) drug therapy: Secondary | ICD-10-CM | POA: Diagnosis not present

## 2016-09-15 DIAGNOSIS — I2510 Atherosclerotic heart disease of native coronary artery without angina pectoris: Secondary | ICD-10-CM | POA: Diagnosis not present

## 2016-09-15 DIAGNOSIS — I5022 Chronic systolic (congestive) heart failure: Secondary | ICD-10-CM | POA: Insufficient documentation

## 2016-09-15 DIAGNOSIS — I130 Hypertensive heart and chronic kidney disease with heart failure and stage 1 through stage 4 chronic kidney disease, or unspecified chronic kidney disease: Secondary | ICD-10-CM | POA: Diagnosis present

## 2016-09-15 LAB — LIPID PANEL
Cholesterol: 207 mg/dL — ABNORMAL HIGH (ref 0–200)
HDL: 48 mg/dL (ref >40)
LDL Cholesterol: 123 mg/dL — ABNORMAL HIGH (ref 0–99)
Total CHOL/HDL Ratio: 4.3 RATIO
Triglycerides: 178 mg/dL — ABNORMAL HIGH (ref <150)
VLDL: 36 mg/dL (ref 0–40)

## 2016-09-15 NOTE — Patient Instructions (Signed)
Routine lab work today. Will notify you of abnormal results  Your provider requests you have an Echocardiogram in May.  Labs in 3 months (bmet)  Follow up with Dr.McLean in 6 months

## 2016-09-17 NOTE — Progress Notes (Signed)
Patient ID: Misty Miller, female   DOB: Aug 18, 1960, 56 y.o.   MRN: [id number] PCP: Dr. Wylene Simmer  56 yo with poorly controlled HTN and CHF presents for cardiology followup.  She has had HTN and diabetes for years.  She has had somewhat sporadic medical care due to economic issues (out of work).  She had been on BP meds in the past but had been rationing them due to finances.  She was admitted to [facility] in 2/16 with a hypertensive emergency and flash pulmonary edema. She had been off irbesartan for months.  She was initially given IV Lasix (just one dose) and started back on irbesartan.  Creatinine rose from 0.95 at admission to 2.71.  Irbesartan and Lasix were stopped.  Echo was done, showing EF 35-40% with basal inferior akinesis and mild to moderate MR.  Troponin was mildly elevated to peak 0.35.  Due to elevated creatinine, she did not have cardiac cath.  Lexiscan Cardiolite was done, showing EF 35% but no ischemia or infarction.  V/Q scan was normal.  BP was controlled and she was sent home.  Creatinine has improved and she is now off Lasix.  Echo in 5/17 showed improvement in EF back to 55%.   She returns for HF follow up. No chest pain or exertional dyspnea now.  No orthopnea/PND.  No lightheadedness or palpitations.  No BRBPR or melena.  BP is high today but has been controlled at home and at other MD appointments.   Labs (2/16): K 4.3, creatinine 0.95 => 2.71 => 1.78, TnI 0.35, Hgb 10, LDL 149, HCT 31, TSH normal, plasma aldosterone 1, urine catecholeamines normal.  Labs (3/16): K 4, creatinine 1.11 Labs (4/16): K 3.9, creatinine 1.09 Labs (12/24/14): K 4.1, creatinine 1.08, P2Y12 261 Labs 02/23/2015: K 3.6 Creatinine 0.90, HCT 35.4, BNP 18 Labs (10/16): K 4.1, creatinine 1.03, BNP 14 Labs (2/18): Creatinine 1.09, K 4.1  PMH: 1. HTN: For > 18 yrs, poor control historically. Renal artery dopplers showed no renal artery stenosis (2/16).   2. Type II diabetes. 3. Chronic systolic CHF: Suspect  mixed hypertensive and ischemic cardiomyopathy.  Admitted 2/16 with hypertensive emergency and pulmonary edema.  Echo (2/16) with EF 35-40%, basal inferior akinesis, mild-moderate MR.  Lexiscan Cardiolite (2/16) with EF 35%, no ischemia or infarction but LHC showed severe RCA disease, as below.    - Echo (5/17): EF 55%, mild LVH.  4. Hyperlipidemia 5. AKI/CKD: AKI in 2/16 in the setting of ARB and diuresis.  6. CAD: LHC (6/16) with 90% proximal RCA, 50% mRCA, 60% dRCA.  Promus DES to proximal RCA.  7. OSA: Moderate, using CPAP. 8. Inadequate platelet inhibition with Plavix.  9. Palpitations: Holter in 4/16 showed primarily NSR, rare PVCs, with 10 beat run of AIVR.  SH: Married, unemployed, nonsmoker, no ETOH.   FH: Diabetes and HTN in multiple family members.  Grandfather MI at 68.  Mother with CHF, no history of MI.  ROS: All systems reviewed and negative except as per HPI.   Current Outpatient Prescriptions  Medication Sig Dispense Refill  . amLODipine (NORVASC) 10 MG tablet Take 10 mg by mouth daily.    Marland Kitchen aspirin EC 81 MG EC tablet Take 1 tablet (81 mg total) by mouth daily.    Marland Kitchen atorvastatin (LIPITOR) 20 MG tablet Take 2 tablets (40 mg total) by mouth daily at 6 PM. 60 tablet 2  . carvedilol (COREG) 25 MG tablet Take 25 mg by mouth 2 (two) times daily  with a meal.    . cloNIDine (CATAPRES) 0.1 MG tablet TAKE 1 TABLET (0.1 MG TOTAL) BY MOUTH 2 (TWO) TIMES DAILY. 180 tablet 1  . furosemide (LASIX) 40 MG tablet Take 1 tablet (40 mg total) by mouth daily. 90 tablet 3  . Insulin Degludec 200 UNIT/ML SOPN Inject 36 Units into the skin every morning. Take when blood glucose is >110    . isosorbide-hydrALAZINE (BIDIL) 20-37.5 MG per tablet Take 2 tablets by mouth 2 (two) times daily. 60 tablet 3  . ONE TOUCH ULTRA TEST test strip USE TO TEST BLOOD SUGAR TWICE DAILY  3  . spironolactone (ALDACTONE) 50 MG tablet Take 1 tablet (50 mg total) by mouth daily. 90 tablet 3  . ticagrelor (BRILINTA) 60  MG TABS tablet Take 1 tablet (60 mg total) by mouth 2 (two) times daily. (Patient not taking: Reported on 09/15/2016) 60 tablet 6   No current facility-administered medications for this encounter.    BP (!) 154/94   Pulse 79   Wt 203 lb (92.1 kg)   LMP  (LMP Unknown)   SpO2 99%   BMI 35.68 kg/m  General: NAD. Neck: JVP 6-7 cm, no thyromegaly or thyroid nodule.   Lungs: Clear to auscultation bilaterally with normal respiratory effort. CV: Nondisplaced PMI.  Heart regular S1/S2, +S4, no murmur.  No peripheral edema.  No carotid bruit.  Normal pedal pulses.  Abdomen: Soft, nontender, no hepatosplenomegaly, no distention.  Skin: Intact without lesions or rashes.  Neurologic: Alert and oriented x 3.  Psych: Normal affect. Extremities: No clubbing or cyanosis.  HEENT: Normal.   Assessment/Plan: 1. Chronic systolic CHF: EF 16-10% by echo 08/17/14.  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  HTN has been treated, and EF on 5/17 echo was improved back to 55%. - No indication for Lasix.   - She is on Bidil 2 tabs tid and Coreg 25 mg bid.  - Continue spironolactone 50 mg daily. Recent BMET was ok.  Repeat BMET in 3 months.  - Repeat echo in 5/18 to make sure that EF remains in normal range.    2. CAD: LHC 12/23/14 showed severe proximal RCA stenosis treated with Promus DES.  She was a Plavix non-responder so was transitioned to Brilinta 90 mg bid.  Continue ASA 81 and atorvastatin 40.  No chest pain.  - DAPT score = 3.  She will continue ticagrelor 60 mg bid.  No bleeding complications.    3. HTN: SBP 120s-130s at home (though high in office today).  Renal artery dopplers normal.  Continue current meds.   4. CKD: AKI at last admission after starting ARB, but fully recovered.  No evidence for renal artery stenosis on renal artery dopplers. - Suspect some baseline renal damage from long-standing HTN and diabetes but recent creatinine within normal range.    5. Palpitations:   Holter in 4/16 showed primarily NSR, rare PVCs- this seems to have resolved.  6. OSA: using CPAP.  Followup in 6 months   [name] 09/17/2016

## 2016-09-19 ENCOUNTER — Telehealth (HOSPITAL_COMMUNITY): Payer: Self-pay | Admitting: *Deleted

## 2016-09-19 DIAGNOSIS — I5022 Chronic systolic (congestive) heart failure: Secondary | ICD-10-CM

## 2016-09-19 MED ORDER — ATORVASTATIN CALCIUM 80 MG PO TABS: 80.0000 mg | ORAL_TABLET | Freq: Every day | ORAL | 3 refills | Status: DC

## 2016-09-19 NOTE — Telephone Encounter (Signed)
Notes Recorded by Modesta Messing, CMA on 09/19/2016 at 11:08 AM EDT Pt aware of lab results and medication change. ------  Notes Recorded by Laurey Morale, MD on 09/18/2016 at 9:27 PM EDT Increase atorvastatin to 80 mg daily, lipids/LFTs in 2 months (goal LDL < 70).    Ref Range & Units 4d ago 7mo ago 53yr ago   Cholesterol 0 - 200 mg/dL 505   397   673     Triglycerides <150 mg/dL 419   <379 mg/dL" class="[id number]"> 177   <150 mg/dL" class="[id number]"> 122    HDL >40 mg/dL 48  41  26S    Total CHOL/HDL Ratio RATIO 4.3  5.5  4.4    VLDL 0 - 40 mg/dL 36  35  24    LDL Cholesterol 0 - 99 mg/dL 341   [id number]   [id number]    Comments:

## 2016-09-19 NOTE — Telephone Encounter (Signed)
-----   Message from Laurey Morale, MD sent at 09/18/2016  9:27 PM EDT ----- Increase atorvastatin to 80 mg daily, lipids/LFTs in 2 months (goal LDL < 70).

## 2016-10-21 ENCOUNTER — Other Ambulatory Visit (HOSPITAL_COMMUNITY): Payer: Self-pay | Admitting: Adult Health

## 2016-10-26 ENCOUNTER — Other Ambulatory Visit: Payer: Self-pay | Admitting: Cardiology

## 2016-11-14 ENCOUNTER — Ambulatory Visit (HOSPITAL_COMMUNITY)
Admission: RE | Admit: 2016-11-14 | Discharge: 2016-11-14 | Disposition: A | Discharge status: Home / Self Care | Payer: Managed Care, Other (non HMO) | Source: Ambulatory Visit | Attending: Cardiology | Admitting: Cardiology

## 2016-11-14 DIAGNOSIS — I340 Nonrheumatic mitral (valve) insufficiency: Secondary | ICD-10-CM | POA: Diagnosis not present

## 2016-11-14 DIAGNOSIS — N189 Chronic kidney disease, unspecified: Secondary | ICD-10-CM | POA: Diagnosis not present

## 2016-11-14 DIAGNOSIS — I5041 Acute combined systolic (congestive) and diastolic (congestive) heart failure: Secondary | ICD-10-CM | POA: Diagnosis not present

## 2016-11-14 DIAGNOSIS — E1122 Type 2 diabetes mellitus with diabetic chronic kidney disease: Secondary | ICD-10-CM | POA: Diagnosis not present

## 2016-11-14 DIAGNOSIS — E785 Hyperlipidemia, unspecified: Secondary | ICD-10-CM | POA: Diagnosis not present

## 2016-11-14 DIAGNOSIS — I130 Hypertensive heart and chronic kidney disease with heart failure and stage 1 through stage 4 chronic kidney disease, or unspecified chronic kidney disease: Secondary | ICD-10-CM | POA: Diagnosis not present

## 2016-11-14 NOTE — Progress Notes (Signed)
  Echocardiogram 2D Echocardiogram has been performed.  Leta Jungling M 11/14/2016, 9:32 AM

## 2016-12-16 ENCOUNTER — Ambulatory Visit (HOSPITAL_COMMUNITY)
Admission: RE | Admit: 2016-12-16 | Discharge: 2016-12-16 | Disposition: A | Discharge status: Home / Self Care | Payer: Managed Care, Other (non HMO) | Source: Ambulatory Visit | Attending: Cardiology | Admitting: Cardiology

## 2016-12-16 ENCOUNTER — Telehealth (HOSPITAL_COMMUNITY): Payer: Self-pay | Admitting: *Deleted

## 2016-12-16 ENCOUNTER — Other Ambulatory Visit (HOSPITAL_COMMUNITY): Payer: Self-pay | Admitting: *Deleted

## 2016-12-16 ENCOUNTER — Encounter (HOSPITAL_COMMUNITY): Payer: Self-pay | Admitting: *Deleted

## 2016-12-16 ENCOUNTER — Emergency Department (HOSPITAL_COMMUNITY)
Admission: EM | Admit: 2016-12-16 | Discharge: 2016-12-16 | Disposition: A | Discharge status: Home / Self Care | Payer: Managed Care, Other (non HMO) | Attending: Emergency Medicine | Admitting: Emergency Medicine

## 2016-12-16 DIAGNOSIS — I5022 Chronic systolic (congestive) heart failure: Secondary | ICD-10-CM | POA: Insufficient documentation

## 2016-12-16 DIAGNOSIS — Z955 Presence of coronary angioplasty implant and graft: Secondary | ICD-10-CM | POA: Insufficient documentation

## 2016-12-16 DIAGNOSIS — I252 Old myocardial infarction: Secondary | ICD-10-CM | POA: Insufficient documentation

## 2016-12-16 DIAGNOSIS — E1165 Type 2 diabetes mellitus with hyperglycemia: Secondary | ICD-10-CM | POA: Diagnosis present

## 2016-12-16 DIAGNOSIS — R739 Hyperglycemia, unspecified: Secondary | ICD-10-CM

## 2016-12-16 DIAGNOSIS — I130 Hypertensive heart and chronic kidney disease with heart failure and stage 1 through stage 4 chronic kidney disease, or unspecified chronic kidney disease: Secondary | ICD-10-CM | POA: Diagnosis not present

## 2016-12-16 DIAGNOSIS — Z87891 Personal history of nicotine dependence: Secondary | ICD-10-CM | POA: Diagnosis not present

## 2016-12-16 DIAGNOSIS — N189 Chronic kidney disease, unspecified: Secondary | ICD-10-CM | POA: Insufficient documentation

## 2016-12-16 DIAGNOSIS — I509 Heart failure, unspecified: Secondary | ICD-10-CM | POA: Insufficient documentation

## 2016-12-16 LAB — BASIC METABOLIC PANEL
Anion gap: 11 (ref 5–15)
BUN: 19 mg/dL (ref 6–20)
CO2: 25 mmol/L (ref 22–32)
Calcium: 9.5 mg/dL (ref 8.9–10.3)
Chloride: 93 mmol/L — ABNORMAL LOW (ref 101–111)
Creatinine, Ser: 1.32 mg/dL — ABNORMAL HIGH (ref 0.44–1.00)
GFR calc Af Amer: 51 mL/min — ABNORMAL LOW (ref >60)
GFR calc non Af Amer: 44 mL/min — ABNORMAL LOW (ref >60)
Glucose, Bld: 618 mg/dL (ref 65–99)
Potassium: 4.5 mmol/L (ref 3.5–5.1)
Sodium: 129 mmol/L — ABNORMAL LOW (ref 135–145)

## 2016-12-16 LAB — CBG MONITORING, ED: Glucose-Capillary: 540 mg/dL (ref 65–99)

## 2016-12-16 LAB — HEPATIC FUNCTION PANEL
ALT: 11 U/L — ABNORMAL LOW (ref 14–54)
AST: 19 U/L (ref 15–41)
Albumin: 3.8 g/dL (ref 3.5–5.0)
Alkaline Phosphatase: 101 U/L (ref 38–126)
Bilirubin, Direct: 0.2 mg/dL (ref 0.1–0.5)
Indirect Bilirubin: 0.8 mg/dL (ref 0.3–0.9)
Total Bilirubin: 1 mg/dL (ref 0.3–1.2)
Total Protein: 7.3 g/dL (ref 6.5–8.1)

## 2016-12-16 LAB — COMPREHENSIVE METABOLIC PANEL
ALT: 11 U/L — ABNORMAL LOW (ref 14–54)
AST: 16 U/L (ref 15–41)
Albumin: 4 g/dL (ref 3.5–5.0)
Alkaline Phosphatase: 100 U/L (ref 38–126)
Anion gap: 10 (ref 5–15)
BUN: 20 mg/dL (ref 6–20)
CO2: 24 mmol/L (ref 22–32)
Calcium: 9.5 mg/dL (ref 8.9–10.3)
Chloride: 95 mmol/L — ABNORMAL LOW (ref 101–111)
Creatinine, Ser: 1.18 mg/dL — ABNORMAL HIGH (ref 0.44–1.00)
GFR calc Af Amer: 59 mL/min — ABNORMAL LOW (ref >60)
GFR calc non Af Amer: 51 mL/min — ABNORMAL LOW (ref >60)
Glucose, Bld: 527 mg/dL (ref 65–99)
Potassium: 4.3 mmol/L (ref 3.5–5.1)
Sodium: 129 mmol/L — ABNORMAL LOW (ref 135–145)
Total Bilirubin: 0.9 mg/dL (ref 0.3–1.2)
Total Protein: 7.5 g/dL (ref 6.5–8.1)

## 2016-12-16 LAB — LIPID PANEL
Cholesterol: 255 mg/dL — ABNORMAL HIGH (ref 0–200)
HDL: 40 mg/dL — ABNORMAL LOW (ref >40)
LDL Cholesterol: 151 mg/dL — ABNORMAL HIGH (ref 0–99)
Total CHOL/HDL Ratio: 6.4 RATIO
Triglycerides: 318 mg/dL — ABNORMAL HIGH (ref <150)
VLDL: 64 mg/dL — ABNORMAL HIGH (ref 0–40)

## 2016-12-16 LAB — CBC WITH DIFFERENTIAL/PLATELET
Basophils Absolute: 0 10*3/uL (ref 0.0–0.1)
Basophils Relative: 0 %
Eosinophils Absolute: 0 10*3/uL (ref 0.0–0.7)
Eosinophils Relative: 1 %
HCT: 37.8 % (ref 36.0–46.0)
Hemoglobin: 12.6 g/dL (ref 12.0–15.0)
Lymphocytes Relative: 42 %
Lymphs Abs: 3.4 10*3/uL (ref 0.7–4.0)
MCH: 27.9 pg (ref 26.0–34.0)
MCHC: 33.3 g/dL (ref 30.0–36.0)
MCV: 83.8 fL (ref 78.0–100.0)
Monocytes Absolute: 0.2 10*3/uL (ref 0.1–1.0)
Monocytes Relative: 3 %
Neutro Abs: 4.4 10*3/uL (ref 1.7–7.7)
Neutrophils Relative %: 54 %
Platelets: 247 10*3/uL (ref 150–400)
RBC: 4.51 MIL/uL (ref 3.87–5.11)
RDW: 13.3 % (ref 11.5–15.5)
WBC: 8 10*3/uL (ref 4.0–10.5)

## 2016-12-16 MED ORDER — METFORMIN HCL 500 MG PO TABS: 500.0000 mg | ORAL_TABLET | Freq: Two times a day (BID) | ORAL | 0 refills | Status: DC

## 2016-12-16 MED ORDER — TICAGRELOR 60 MG PO TABS: 60.0000 mg | ORAL_TABLET | Freq: Two times a day (BID) | ORAL | 6 refills | Status: DC

## 2016-12-16 NOTE — Discharge Instructions (Signed)

## 2016-12-16 NOTE — Telephone Encounter (Signed)
Lab called with critical glucose level of 618 this morning.  I called and spoke with patient and asked her if she had been in touch with her PCP and she stated she had but she is unable to afford her medications right now because she doesn't have a job.  She stated she is trying to apply for disability but she needed some help.  I advised her to go to the emergency room today and I will send Annice Pih our CSW a message who will contact her next week and see if she can help patient with applying for disability.  Patient is agreeable to go to the emergency room and will talk with Annice Pih once she contacts her.

## 2016-12-16 NOTE — ED Provider Notes (Signed)
Emergency Department Provider Note   I have reviewed the triage vital signs and the nursing notes.   HISTORY  Chief Complaint Abnormal Lab   HPI Misty Miller is a 56 y.o. female with PMH of CHF, CAD s/p DES placement in 2016, DM, HTN, and HLD presents to the emergency department for evaluation of hyperglycemia. The patient was undergoing routine lab testing with her cardiologist this morning when found to have a blood sugar in the 600s. She denies any other symptoms. She lost insurance several months ago and has not been able to follow up with her primary care physician or afford her diabetes medication. She is not been taking DM medication for the past 4 months. She denies any nausea, vomiting, or infection symptoms, or abdominal pain. She's not having chest pain or difficulty breathing. She has also stopped taking her Ticagrelor because of cost issues. She saw her cardiologist this morning but did not mention that to them. No radiation of symptoms.    Past Medical History:  Diagnosis Date  . CHF (congestive heart failure) (HCC) 08/16/2014  . Chronic kidney disease 08/16/2014  . Coronary artery disease    cath 12/23/2014 single vessel dx, DES to RCA, moderate mid RCA residual  . Family history of adverse reaction to anesthesia    "it's hard to wake my mom after procedure is over"  . History of blood transfusion 1992   S/P miscarriage  . Hyperlipemia   . Hypertension   . Myocardial infarction (HCC) 08/16/2014   "mild"  . Obesity (BMI 30-39.9) 04/08/2015  . OSA (obstructive sleep apnea)    moderate with an AHI of   . Sleep apnea    "recently tested positive; no mask yet" (12/23/2014)  . Type II diabetes mellitus [facility])     Patient Active Problem List   Diagnosis Date Noted  . Obesity (BMI 30-39.9) 04/08/2015  . OSA (obstructive sleep apnea)   . OSA on CPAP 03/05/2015  . CAD in native artery 12/23/2014  . Chest pain at rest 12/18/2014  . Chronic systolic CHF (congestive heart  failure) (HCC) 09/01/2014  . CKD (chronic kidney disease) 09/01/2014  . Acute kidney injury (HCC) 08/17/2014  . Flash pulmonary edema (HCC) 08/16/2014  . CHF (congestive heart failure) (HCC) 08/16/2014  . Hypertensive emergency 08/16/2014  . HTN (hypertension) 08/16/2014  . DM (diabetes mellitus), type 2, uncontrolled (HCC) 08/16/2014  . At risk for sleep apnea 08/16/2014    Past Surgical History:  Procedure Laterality Date  . CARDIAC CATHETERIZATION N/A 12/23/2014   Procedure: Left Heart Cath and Coronary Angiography;  Surgeon: Laurey Morale, MD;  [facility] INVASIVE CV LAB;  Service: Cardiovascular;  Laterality: N/A;  . CARDIAC CATHETERIZATION N/A 12/23/2014   Procedure: Intravascular Pressure Wire/FFR Study;  Surgeon: Lyn Records, MD;  Location: [facility] INVASIVE CV LAB;  Service: Cardiovascular;  Laterality: N/A;  . CARDIAC CATHETERIZATION N/A 12/23/2014   Procedure: Coronary Stent Intervention;  Surgeon: Lyn Records, MD;  Location: [facility] INVASIVE CV LAB;  Service: Cardiovascular;  Laterality: N/A;  . CESAREAN SECTION  06/1995  . CORONARY ANGIOPLASTY WITH STENT PLACEMENT  12/23/2014   "1"  . 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 Order #: [id number] Class: Historical Med  . Order #: [id number] Class: No Print  . Order #: [id number] Class: Historical Med  . Order #: [id number] Class: Normal  . Order #: [id number] Class: Normal  . Order #: [id number] Class: Historical Med  .  Order #: [id number] Class: Normal  . Order #: [id number] Class: Historical Med  . Order #: [id number] Class: Normal  . Order #: [id number] Class: Normal  . Order #: [id number] Class: Print  . Order #: [id number] Class: Normal  . Order #: [id number] Class: Print    Allergies Patient has no known allergies.  Family History  Problem Relation Age of Onset  . Cancer Mother     Social History Social History  Substance Use Topics  . Smoking status: Former Smoker    Packs/day: 0.25     Years: 20.00    Types: Cigarettes    Quit date: 11/09/1998  . Smokeless tobacco: Never Used  . Alcohol use Yes     Comment: 12/23/2014 "might have a couple mixed drinks once/month"    Review of Systems  Constitutional: No fever/chills Eyes: No visual changes. ENT: No sore throat. Cardiovascular: Denies chest pain. Respiratory: Denies shortness of breath. Gastrointestinal: No abdominal pain.  No nausea, no vomiting.  No diarrhea.  No constipation. Genitourinary: Negative for dysuria. Musculoskeletal: Negative for back pain. Skin: Negative for rash. Neurological: Negative for headaches, focal weakness or numbness.  10-point ROS otherwise negative.  ____________________________________________   PHYSICAL EXAM:  VITAL SIGNS: ED Triage Vitals  Enc Vitals Group     BP 12/16/16 1532 (!) 151/98     Pulse Rate 12/16/16 1532 79     Resp 12/16/16 1532 12     Temp 12/16/16 1532 98.3 F (36.8 C)     Temp Source 12/16/16 1532 Oral     SpO2 12/16/16 1532 97 %     Weight 12/16/16 1530 184 lb 5 oz (83.6 kg)     Height 12/16/16 1530 5\' 4"  (1.626 m)   Constitutional: Alert and oriented. Well appearing and in no acute distress. Eyes: Conjunctivae are normal.  Head: Atraumatic. Nose: No congestion/rhinnorhea. Mouth/Throat: Mucous membranes are moist.   Neck: No stridor.  Cardiovascular: Normal rate, regular rhythm. Good peripheral circulation. Grossly normal heart sounds.   Respiratory: Normal respiratory effort.  No retractions. Lungs CTAB. Gastrointestinal: Soft and nontender. No distention.  Musculoskeletal: No lower extremity tenderness nor edema. No gross deformities of extremities. Neurologic:  Normal speech and language. No gross focal neurologic deficits are appreciated.  Skin:  Skin is warm, dry and intact. No rash noted.  ____________________________________________   LABS (all labs ordered are listed, but only abnormal results are displayed)  Labs Reviewed    COMPREHENSIVE METABOLIC PANEL - Abnormal; Notable for the following:       Result Value   Sodium 129 (*)    Chloride 95 (*)    Glucose, Bld 527 (*)    Creatinine, Ser 1.18 (*)    ALT 11 (*)    GFR calc non Af Amer 51 (*)    GFR calc Af Amer 59 (*)    All other components within normal limits  CBG MONITORING, ED - Abnormal; Notable for the following:    Glucose-Capillary 540 (*)    All other components within normal limits  CBC WITH DIFFERENTIAL/PLATELET   ____________________________________________  RADIOLOGY  None ____________________________________________   PROCEDURES  Procedure(s) performed:   Procedures  None ____________________________________________   INITIAL IMPRESSION / ASSESSMENT AND PLAN / ED COURSE  Pertinent labs & imaging results that were available during my care of the patient were reviewed by me and considered in my medical decision making (see chart for details).  Patient presents to the emergency department for evaluation of hyperglycemia. She has been off her insulin for the past 4  months. Her blood sugar is elevated but no evidence of DKA or HHS. Patient is awake, alert, in no acute distress. Not complaining of any ACS symptoms. In addition to being off of her diabetes medication she is also not taking her Ticagrelor. Discussed this is concerning with her DES in place. She will call her Cardiologist on Monday. Plan to refill her Ticagrelor and will discuss cost issues with Cardiology. Discussed return precautions for CP and SOB. Will start Metformin and refer to [facility] for PCP and Rx assistance program. Discussed plan in detail with patient.   At this time, I do not feel there is any life-threatening condition present. I have reviewed and discussed all results (EKG, imaging, lab, urine as appropriate), exam findings with patient. I have reviewed nursing notes and appropriate previous records.  I feel the patient is safe to be  discharged home without further emergent workup. Discussed usual and customary return precautions. Patient and family (if present) verbalize understanding and are comfortable with this plan.  Patient will follow-up with their primary care provider. If they do not have a primary care provider, information for follow-up has been provided to them. All questions have been answered.  ____________________________________________  FINAL CLINICAL IMPRESSION(S) / ED DIAGNOSES  Final diagnoses:  Hyperglycemia     MEDICATIONS GIVEN DURING THIS VISIT:  None  NEW OUTPATIENT MEDICATIONS STARTED DURING THIS VISIT:  Discharge Medication List as of 12/16/2016  5:49 PM    START taking these medications   Details  metFORMIN (GLUCOPHAGE) 500 MG tablet Take 1 tablet (500 mg total) by mouth 2 (two) times daily with a meal., Starting Fri 12/16/2016, Until Sun 01/15/2017, Print      Ticagrelor refill Rx.     Note:  This document was prepared using Dragon voice recognition software and may include unintentional dictation errors.  Alona Bene, MD Emergency Medicine   Long, Arlyss Repress, MD 12/17/16 [phone]

## 2016-12-16 NOTE — Telephone Encounter (Signed)
Patient called in asking for Echo results from Nov 14, 2016.  Called her back and she is aware of results, no further questions at this time.

## 2016-12-16 NOTE — ED Triage Notes (Signed)
Pt here d/t abnormal labs, pt called by PCP today to come here for glucose level 618, pt states, "Since I quit work I cannot afford my medicines." pt denies pain, denies blurred vision, denies n/v/d,  A&O x4

## 2016-12-19 ENCOUNTER — Telehealth (HOSPITAL_COMMUNITY): Payer: Self-pay | Admitting: *Deleted

## 2016-12-19 ENCOUNTER — Telehealth: Payer: Self-pay | Admitting: Licensed Clinical Social Worker

## 2016-12-19 MED ORDER — ATORVASTATIN CALCIUM 40 MG PO TABS: 40.0000 mg | ORAL_TABLET | Freq: Every day | ORAL | 3 refills | Status: DC

## 2016-12-19 NOTE — Telephone Encounter (Signed)
CSW received referral to assist with information on applying for disability. Patient reports she was denied in the past but "I didn't put CHF on my application". CSW advised patient on application process and to sign release of information requesting medical records for HF clinic records. Patient verbalizes understanding and will follow up. CSW available as needed. Lasandra Beech, LCSW, CCSW-MCS [phone]

## 2016-12-19 NOTE — Telephone Encounter (Signed)
-----   Message from Laurey Morale, MD sent at 12/16/2016  4:10 PM EDT ----- LDL is very high.  Is she taking atorvastatin 20 mg daily? If she is, increase to 80 mg daily.  If she is not, start atorvastatin 40 mg daily.  Lipids/LFTs in 2 months.   Glucose very high.  Needs to be seen by PCP today or in ER.

## 2016-12-20 ENCOUNTER — Encounter: Payer: Self-pay | Admitting: Physician Assistant

## 2016-12-20 ENCOUNTER — Other Ambulatory Visit: Payer: Self-pay | Admitting: Pharmacist

## 2016-12-20 ENCOUNTER — Ambulatory Visit: Payer: 59 | Attending: Internal Medicine | Admitting: Physician Assistant

## 2016-12-20 VITALS — BP 125/86 | HR 77 | Temp 98.0°F | Resp 16 | Ht 62.0 in | Wt 185.2 lb

## 2016-12-20 DIAGNOSIS — E1165 Type 2 diabetes mellitus with hyperglycemia: Secondary | ICD-10-CM | POA: Insufficient documentation

## 2016-12-20 DIAGNOSIS — Z794 Long term (current) use of insulin: Secondary | ICD-10-CM | POA: Insufficient documentation

## 2016-12-20 DIAGNOSIS — G4733 Obstructive sleep apnea (adult) (pediatric): Secondary | ICD-10-CM | POA: Diagnosis not present

## 2016-12-20 DIAGNOSIS — N183 Chronic kidney disease, stage 3 unspecified: Secondary | ICD-10-CM

## 2016-12-20 DIAGNOSIS — I10 Essential (primary) hypertension: Secondary | ICD-10-CM | POA: Diagnosis not present

## 2016-12-20 DIAGNOSIS — I509 Heart failure, unspecified: Secondary | ICD-10-CM | POA: Insufficient documentation

## 2016-12-20 DIAGNOSIS — I252 Old myocardial infarction: Secondary | ICD-10-CM | POA: Insufficient documentation

## 2016-12-20 DIAGNOSIS — I130 Hypertensive heart and chronic kidney disease with heart failure and stage 1 through stage 4 chronic kidney disease, or unspecified chronic kidney disease: Secondary | ICD-10-CM | POA: Diagnosis present

## 2016-12-20 DIAGNOSIS — E785 Hyperlipidemia, unspecified: Secondary | ICD-10-CM | POA: Insufficient documentation

## 2016-12-20 DIAGNOSIS — E1122 Type 2 diabetes mellitus with diabetic chronic kidney disease: Secondary | ICD-10-CM | POA: Diagnosis not present

## 2016-12-20 DIAGNOSIS — Z7982 Long term (current) use of aspirin: Secondary | ICD-10-CM | POA: Diagnosis not present

## 2016-12-20 DIAGNOSIS — E118 Type 2 diabetes mellitus with unspecified complications: Secondary | ICD-10-CM

## 2016-12-20 DIAGNOSIS — I2510 Atherosclerotic heart disease of native coronary artery without angina pectoris: Secondary | ICD-10-CM | POA: Diagnosis not present

## 2016-12-20 DIAGNOSIS — IMO0002 Reserved for concepts with insufficient information to code with codable children: Secondary | ICD-10-CM

## 2016-12-20 DIAGNOSIS — E669 Obesity, unspecified: Secondary | ICD-10-CM | POA: Diagnosis not present

## 2016-12-20 LAB — POCT URINALYSIS DIPSTICK
Bilirubin, UA: NEGATIVE
Blood, UA: NEGATIVE
Glucose, UA: 500
Ketones, UA: NEGATIVE
Leukocytes, UA: NEGATIVE
Nitrite, UA: NEGATIVE
Protein, UA: NEGATIVE
Spec Grav, UA: 1.02 (ref 1.010–1.025)
Urobilinogen, UA: 0.2 E.U./dL
pH, UA: 5 (ref 5.0–8.0)

## 2016-12-20 LAB — POCT GLYCOSYLATED HEMOGLOBIN (HGB A1C): Hemoglobin A1C: 15

## 2016-12-20 LAB — GLUCOSE, POCT (MANUAL RESULT ENTRY)
POC Glucose: 420 mg/dl — AB (ref 70–99)
POC Glucose: 424 mg/dl — AB (ref 70–99)

## 2016-12-20 MED ORDER — ATORVASTATIN CALCIUM 40 MG PO TABS: 40.0000 mg | ORAL_TABLET | Freq: Every day | ORAL | 3 refills | Status: DC

## 2016-12-20 MED ORDER — CLONIDINE HCL 0.1 MG PO TABS: ORAL_TABLET | ORAL | 1 refills | Status: DC

## 2016-12-20 MED ORDER — FUROSEMIDE 40 MG PO TABS: 40.0000 mg | ORAL_TABLET | Freq: Every day | ORAL | 3 refills | Status: DC

## 2016-12-20 MED ORDER — BASAGLAR KWIKPEN 100 UNIT/ML ~~LOC~~ SOPN: 20.0000 [IU] | PEN_INJECTOR | Freq: Every day | SUBCUTANEOUS | 11 refills | Status: DC

## 2016-12-20 MED ORDER — INSULIN ASPART 100 UNIT/ML ~~LOC~~ SOLN
20.0000 [IU] | Freq: Once | SUBCUTANEOUS | Status: AC
  Administered 2016-12-20: 20 [IU] via SUBCUTANEOUS

## 2016-12-20 MED ORDER — TICAGRELOR 90 MG PO TABS: 90.0000 mg | ORAL_TABLET | Freq: Every day | ORAL | 3 refills | Status: DC

## 2016-12-20 MED ORDER — INSULIN GLARGINE 100 UNITS/ML SOLOSTAR PEN: 20.0000 [IU] | PEN_INJECTOR | Freq: Every day | SUBCUTANEOUS | 11 refills | Status: DC

## 2016-12-20 MED ORDER — SPIRONOLACTONE 50 MG PO TABS: 50.0000 mg | ORAL_TABLET | Freq: Every day | ORAL | 3 refills | Status: DC

## 2016-12-20 MED FILL — ?CLONIDINE HCL 0.1 MG TABL: 0.1 | 30 days supply | Qty: 60 | Fill #0

## 2016-12-20 MED FILL — ?FUROSEMIDE 40 MG TABLET: 40 | 30 days supply | Qty: 30 | Fill #0

## 2016-12-20 MED FILL — ?ATORVASTATIN 40MG TABLET: 40 | 30 days supply | Qty: 30 | Fill #0

## 2016-12-20 NOTE — Progress Notes (Signed)
Patient ID: Misty Miller, female   DOB: 06/21/1961, 56 y.o.   MRN: [id number]     Misty Miller, is a 56 y.o. female  HTM:[id number]  ECX:[id number]  DOB - Aug 23, 1960  Subjective:  Chief Complaint and HPI: Misty Miller is a 56 y.o. female here today to establish care and for a follow up visit After being seen in the ED 12/16/2016 for a blood sugar >600 at her cardiologists office.  She had been off her diabetes medications for 4 months due to cost.  She was not in DKA.  She was given IVF and metformin was restarted.   Blood sugars still running bt 400-600 at home.  She denies any s/sx of hyperglycemia.  She had not been having any problems when she was told after her labs from the cardiologist came back abnormal and they advised her to go to the ED.  She is on her [employer] and previously also took [place] but couldn't afford it.    ED/Hospital notes reviewed.    ROS:   Constitutional:  No f/c, No night sweats, No unexplained weight loss. EENT:  No vision changes, No blurry vision, No hearing changes. No mouth, throat, or ear problems.  Respiratory: No cough, No SOB Cardiac: No CP, no palpitations GI:  No abd pain, No N/V/D. GU: No Urinary s/sx Musculoskeletal: No joint pain Neuro: No headache, no dizziness, no motor weakness.  Skin: No rash Endocrine:  No polydipsia. No polyuria.  Psych: Denies SI/HI  No problems updated.  ALLERGIES: No Known Allergies  PAST MEDICAL HISTORY: Past Medical History:  Diagnosis Date  . CHF (congestive heart failure) (HCC) 08/16/2014  . Chronic kidney disease 08/16/2014  . Coronary artery disease    cath 12/23/2014 single vessel dx, DES to RCA, moderate mid RCA residual  . Family history of adverse reaction to anesthesia    "it's hard to wake my mom after procedure is over"  . History of blood transfusion 1992   S/P miscarriage  . Hyperlipemia   . Hypertension   . Myocardial infarction (HCC) 08/16/2014   "mild"  . Obesity (BMI  30-39.9) 04/08/2015  . OSA (obstructive sleep apnea)    moderate with an AHI of   . Sleep apnea    "recently tested positive; no mask yet" (12/23/2014)  . Type II diabetes mellitus (HCC)     MEDICATIONS AT HOME: Prior to Admission medications   Medication Sig Start Date End Date Taking? Authorizing Provider  amLODipine (NORVASC) 10 MG tablet Take 10 mg by mouth daily.   Yes [provider]  aspirin EC 81 MG EC tablet Take 1 tablet (81 mg total) by mouth daily. 08/20/14  Yes Tisovec, Adelfa Koh, MD  atorvastatin (LIPITOR) 40 MG tablet Take 1 tablet (40 mg total) by mouth daily at 6 PM. 12/20/16  Yes McClung, Marylene Land M, PA-C  carvedilol (COREG) 25 MG tablet Take 25 mg by mouth 2 (two) times daily with a meal.   Yes [provider]  cloNIDine (CATAPRES) 0.1 MG tablet TAKE 1 TABLET (0.1 MG TOTAL) BY MOUTH 2 (TWO) TIMES DAILY. 12/20/16  Yes Anders Simmonds, PA-C  furosemide (LASIX) 40 MG tablet Take 1 tablet (40 mg total) by mouth daily. 12/20/16 03/20/17 Yes McClung, Marzella Schlein, PA-C  insulin glargine (LANTUS) 100 unit/mL SOPN Inject 0.2 mLs (20 Units total) into the skin at bedtime. 12/20/16  Yes Georgian Co M, PA-C  metFORMIN (GLUCOPHAGE) 500 MG tablet Take 1 tablet (500 mg total) by mouth  2 (two) times daily with a meal. 12/16/16 01/15/17 Yes Long, Arlyss Repress, MD  ONE TOUCH ULTRA TEST test strip USE TO TEST BLOOD SUGAR TWICE DAILY 01/08/15  Yes [provider]  spironolactone (ALDACTONE) 50 MG tablet Take 1 tablet (50 mg total) by mouth daily. 12/20/16  Yes Anders Simmonds, PA-C  ticagrelor (BRILINTA) 90 MG TABS tablet Take 1 tablet (90 mg total) by mouth daily. 12/20/16  Yes Anders Simmonds, PA-C     Objective:  EXAM:   Vitals:   12/20/16 1415  BP: 125/86  Pulse: 77  Resp: 16  Temp: 98 F (36.7 C)  TempSrc: Oral  SpO2: 97%  Weight: 185 lb 3.2 oz (84 kg)  Height: 5\' 2"  (1.575 m)    General appearance : A&OX3. NAD. Non-toxic-appearing HEENT: Atraumatic and  Normocephalic.  PERRLA. EOM intact.  Neck: supple, no JVD. No cervical lymphadenopathy. No thyromegaly Chest/Lungs:  Breathing-non-labored, Good air entry bilaterally, breath sounds normal without rales, rhonchi, or wheezing  CVS: S1 S2 regular, no murmurs, gallops, rubs Extremities: Bilateral Lower Ext shows no edema, both legs are warm to touch with = pulse throughout Neurology:  CN II-XII grossly intact, Non focal.   Psych:  TP linear. J/I WNL. Normal speech. Appropriate eye contact and affect.  Skin:  No Rash  Data Review Lab Results  Component Value Date   HGBA1C 15 12/20/2016   HGBA1C 8.5 (H) 08/16/2014   HGBA1C 8.8 (H) 08/16/2014     Assessment & Plan   1. Essential hypertension Controlled.  Continue current regimen.  Followed by Dr Sherlie Ban in cardiology - Comprehensive metabolic panel  2. Uncontrolled type 2 diabetes mellitus with complication, without long-term current use of insulin (HCC) Uncontrolled- - Glucose (CBG) - HgB A1c - Comprehensive metabolic panel start- insulin glargine (LANTUS) 100 unit/mL SOPN; Inject 0.2 mLs (20 Units total) into the skin at bedtime.  Dispense: 15 mL; Refill: 11 - POCT urinalysis dipstick - insulin aspart (novoLOG) injection 20 Units; Inject 0.2 mLs (20 Units total) into the skin once. Continue metformin.   3. Stage 3 chronic kidney disease Check CMP  Patient have been counseled extensively about nutrition and exercise  Return in about 3 weeks (around 01/10/2017) for assign PCP; recheck uncontrolled DM; htn.  The patient was given clear instructions to go to ER or return to medical center if symptoms don't improve, worsen or new problems develop. The patient verbalized understanding. The patient was told to call to get lab results if they haven't heard anything in the next week.     Georgian Co, PA-C [facility] [place], [place] [phone]   12/20/2016, 2:37 PM

## 2016-12-20 NOTE — Patient Instructions (Signed)
Check blood sugars fasting and at mealtimes and record and bring to next visit.  Keep all cardiology follow-ups.

## 2016-12-21 ENCOUNTER — Other Ambulatory Visit: Payer: Self-pay | Admitting: Physician Assistant

## 2016-12-21 LAB — COMPREHENSIVE METABOLIC PANEL
ALT: 10 IU/L (ref 0–32)
AST: 13 IU/L (ref 0–40)
Albumin/Globulin Ratio: 1.5 (ref 1.2–2.2)
Albumin: 4.6 g/dL (ref 3.5–5.5)
Alkaline Phosphatase: 114 IU/L (ref 39–117)
BUN/Creatinine Ratio: 25 — ABNORMAL HIGH (ref 9–23)
BUN: 31 mg/dL — ABNORMAL HIGH (ref 6–24)
Bilirubin Total: 0.7 mg/dL (ref 0.0–1.2)
CO2: 23 mmol/L (ref 20–29)
Calcium: 10.2 mg/dL (ref 8.7–10.2)
Chloride: 90 mmol/L — ABNORMAL LOW (ref 96–106)
Creatinine, Ser: 1.26 mg/dL — ABNORMAL HIGH (ref 0.57–1.00)
GFR calc Af Amer: 55 mL/min/{1.73_m2} — ABNORMAL LOW (ref >59)
GFR calc non Af Amer: 48 mL/min/{1.73_m2} — ABNORMAL LOW (ref >59)
Globulin, Total: 3.1 g/dL (ref 1.5–4.5)
Glucose: 399 mg/dL — ABNORMAL HIGH (ref 65–99)
Potassium: 4.8 mmol/L (ref 3.5–5.2)
Sodium: 130 mmol/L — ABNORMAL LOW (ref 134–144)
Total Protein: 7.7 g/dL (ref 6.0–8.5)

## 2016-12-21 MED ORDER — TICAGRELOR 90 MG PO TABS: 90.0000 mg | ORAL_TABLET | Freq: Two times a day (BID) | ORAL | 3 refills | Status: DC

## 2016-12-22 ENCOUNTER — Encounter: Payer: Self-pay | Admitting: Pharmacist

## 2016-12-22 ENCOUNTER — Telehealth: Payer: Self-pay | Admitting: *Deleted

## 2016-12-22 NOTE — Telephone Encounter (Signed)
-----   Message from Anders Simmonds, [place] sent at 12/21/2016  8:16 AM EDT ----- Your blood work is not much different than when you were at the hospital.  This should all improve as your blood sugar gets under better control.  Check blood sugars as planned and follow-up as planned.  Thanks, Georgian Co, PA-C

## 2016-12-22 NOTE — Progress Notes (Signed)
[employer] prior authorization request received. Based on them not covering any insulin, patient likely does not have injectable therapy coverage on her insurance plan.

## 2016-12-22 NOTE — Telephone Encounter (Signed)
Patient verified DOB Patient is aware of blood results not being too different from the hospital. Patient advised to control blood sugars and FU as scheduled. Patient expressed her understanding and had no further questions.

## 2016-12-22 NOTE — Progress Notes (Signed)
Prior authorization completed for Basaglar through [employer].  Pending approval.

## 2017-01-10 ENCOUNTER — Ambulatory Visit: Payer: 59 | Attending: Internal Medicine | Admitting: Internal Medicine

## 2017-01-10 ENCOUNTER — Encounter: Payer: Self-pay | Admitting: Internal Medicine

## 2017-01-10 VITALS — BP 156/97 | HR 72 | Temp 98.2°F | Resp 16 | Wt 189.8 lb

## 2017-01-10 DIAGNOSIS — N183 Chronic kidney disease, stage 3 unspecified: Secondary | ICD-10-CM

## 2017-01-10 DIAGNOSIS — Z1231 Encounter for screening mammogram for malignant neoplasm of breast: Secondary | ICD-10-CM | POA: Diagnosis not present

## 2017-01-10 DIAGNOSIS — E669 Obesity, unspecified: Secondary | ICD-10-CM | POA: Diagnosis not present

## 2017-01-10 DIAGNOSIS — E1165 Type 2 diabetes mellitus with hyperglycemia: Secondary | ICD-10-CM | POA: Diagnosis not present

## 2017-01-10 DIAGNOSIS — Z87891 Personal history of nicotine dependence: Secondary | ICD-10-CM | POA: Diagnosis not present

## 2017-01-10 DIAGNOSIS — I2510 Atherosclerotic heart disease of native coronary artery without angina pectoris: Secondary | ICD-10-CM | POA: Diagnosis not present

## 2017-01-10 DIAGNOSIS — I5022 Chronic systolic (congestive) heart failure: Secondary | ICD-10-CM | POA: Insufficient documentation

## 2017-01-10 DIAGNOSIS — Z6834 Body mass index (BMI) 34.0-34.9, adult: Secondary | ICD-10-CM | POA: Insufficient documentation

## 2017-01-10 DIAGNOSIS — Z7982 Long term (current) use of aspirin: Secondary | ICD-10-CM | POA: Insufficient documentation

## 2017-01-10 DIAGNOSIS — Z1211 Encounter for screening for malignant neoplasm of colon: Secondary | ICD-10-CM | POA: Diagnosis not present

## 2017-01-10 DIAGNOSIS — E1122 Type 2 diabetes mellitus with diabetic chronic kidney disease: Secondary | ICD-10-CM | POA: Diagnosis not present

## 2017-01-10 DIAGNOSIS — G4733 Obstructive sleep apnea (adult) (pediatric): Secondary | ICD-10-CM | POA: Diagnosis not present

## 2017-01-10 DIAGNOSIS — I10 Essential (primary) hypertension: Secondary | ICD-10-CM

## 2017-01-10 DIAGNOSIS — Z794 Long term (current) use of insulin: Secondary | ICD-10-CM | POA: Diagnosis not present

## 2017-01-10 DIAGNOSIS — I130 Hypertensive heart and chronic kidney disease with heart failure and stage 1 through stage 4 chronic kidney disease, or unspecified chronic kidney disease: Secondary | ICD-10-CM | POA: Diagnosis not present

## 2017-01-10 DIAGNOSIS — E118 Type 2 diabetes mellitus with unspecified complications: Secondary | ICD-10-CM

## 2017-01-10 DIAGNOSIS — Z1239 Encounter for other screening for malignant neoplasm of breast: Secondary | ICD-10-CM

## 2017-01-10 DIAGNOSIS — IMO0001 Reserved for inherently not codable concepts without codable children: Secondary | ICD-10-CM

## 2017-01-10 LAB — POCT URINALYSIS DIPSTICK
Bilirubin, UA: NEGATIVE
Glucose, UA: 500
Ketones, UA: NEGATIVE
Leukocytes, UA: NEGATIVE
Nitrite, UA: NEGATIVE
Protein, UA: NEGATIVE
Spec Grav, UA: 1.015 (ref 1.010–1.025)
Urobilinogen, UA: 0.2 E.U./dL
pH, UA: 5 (ref 5.0–8.0)

## 2017-01-10 LAB — GLUCOSE, POCT (MANUAL RESULT ENTRY)
POC Glucose: 401 mg/dl — AB (ref 70–99)
POC Glucose: 420 mg/dl — AB (ref 70–99)
POC Glucose: 331 mg/dl — AB (ref 70–99)

## 2017-01-10 MED ORDER — INSULIN ASPART 100 UNIT/ML ~~LOC~~ SOLN: 6.0000 [IU] | Freq: Once | SUBCUTANEOUS | Status: DC

## 2017-01-10 MED ORDER — METFORMIN HCL 1000 MG PO TABS: ORAL_TABLET | ORAL | 6 refills | Status: DC

## 2017-01-10 MED ORDER — BASAGLAR KWIKPEN 100 UNIT/ML ~~LOC~~ SOPN: 26.0000 [IU] | PEN_INJECTOR | Freq: Every day | SUBCUTANEOUS | 11 refills | Status: DC

## 2017-01-10 MED ORDER — INSULIN ASPART 100 UNIT/ML ~~LOC~~ SOLN
15.0000 [IU] | Freq: Once | SUBCUTANEOUS | Status: AC
  Administered 2017-01-10: 15 [IU] via SUBCUTANEOUS

## 2017-01-10 NOTE — Progress Notes (Signed)
Pt needs a refill on brilinta and carvedilol

## 2017-01-10 NOTE — Progress Notes (Signed)
Patient ID: YOCELIN VANLUE, female    DOB: [birth year]  MRN: [id number]  CC: Establish Care; Diabetes; and Hypertension   Subjective: Mattalyn Anderegg is a 56 y.o. female who presents to become established with me as PCP. Her concerns today include:  Patient with history of HTN, diabetes type 2, chronic systolic CHF with EF 35-40 percent as of 08/2016, coronary artery disease with stent to RCA, obstructive sleep apnea on CPAP and obesity. Previous PCP was at [facility].  Last seen sometime last yr.  Pt loss job. Patient recently seen by our physician assistant post ER visit and was started on Lantus 20 units and continued on metformin for uncontrolled diabetes.   DIABETES TYPE 2 Last A1C:   Results for orders placed or performed in visit on 01/10/17  POCT glucose (manual entry)  Result Value Ref Range   POC Glucose 401 (A) 70 - 99 mg/dl  POCT glucose (manual entry)  Result Value Ref Range   POC Glucose 420 (A) 70 - 99 mg/dl  POCT glucose (manual entry)  Result Value Ref Range   POC Glucose 331 (A) 70 - 99 mg/dl    Med Adherence:  [x]  Yes. Co-pay on LAntus was $120.    []  No Medication side effects:  []  Yes    [x]  No Home Monitoring?  [x]  Yes checking 3-6 x a day    []  No Home glucose results range:270-400s.Patient has glucometer with her. Diet Adherence: [x]  Yes, states she is doing better.  Not eating fatty foods.  She has cut back on white carbs. Drinking mainly water and diet drinks.  "I left the sweet tea alone."   []  No Exercise: [x]  Yes, walking 3 x a wk for the past 1.5 wks   []  No Hypoglycemic episodes?: []  Yes    [x]  No Numbness of the feet? []  Yes    [x]  No Retinopathy hx? []  Yes    [x]  No, over due  Last eye exam: no blurred vision Comments: just ate [employer] fries and soda on her way over her today  2. HTN -checks BP Q a.m.  This a.m was 130/84 -compliant with meds and salt restriction.  Ate some [employer] fries just before coming in -needs RF on Coreg  3.  CAD: -out of Brilinta for 2 mths.  Co-pay is $131. Stent was placed 12/2015   Patient Active Problem List   Diagnosis Date Noted  . Obesity (BMI 30-39.9) 04/08/2015  . OSA (obstructive sleep apnea)   . OSA on CPAP 03/05/2015  . CAD in native artery 12/23/2014  . Chest pain at rest 12/18/2014  . Chronic systolic CHF (congestive heart failure) (HCC) 09/01/2014  . CKD (chronic kidney disease) 09/01/2014  . Acute kidney injury (HCC) 08/17/2014  . Flash pulmonary edema (HCC) 08/16/2014  . CHF (congestive heart failure) (HCC) 08/16/2014  . Hypertensive emergency 08/16/2014  . HTN (hypertension) 08/16/2014  . DM (diabetes mellitus), type 2, uncontrolled (HCC) 08/16/2014  . At risk for sleep apnea 08/16/2014     Current Outpatient Prescriptions on File Prior to Visit  Medication Sig Dispense Refill  . amLODipine (NORVASC) 10 MG tablet Take 10 mg by mouth daily.    Marland Kitchen aspirin EC 81 MG EC tablet Take 1 tablet (81 mg total) by mouth daily.    Marland Kitchen atorvastatin (LIPITOR) 40 MG tablet Take 1 tablet (40 mg total) by mouth daily at 6 PM. 30 tablet 3  . carvedilol (COREG) 25 MG tablet Take 25 mg  by mouth 2 (two) times daily with a meal.    . cloNIDine (CATAPRES) 0.1 MG tablet TAKE 1 TABLET (0.1 MG TOTAL) BY MOUTH 2 (TWO) TIMES DAILY. 180 tablet 1  . furosemide (LASIX) 40 MG tablet Take 1 tablet (40 mg total) by mouth daily. 90 tablet 3  . ONE TOUCH ULTRA TEST test strip USE TO TEST BLOOD SUGAR TWICE DAILY  3  . spironolactone (ALDACTONE) 50 MG tablet Take 1 tablet (50 mg total) by mouth daily. 90 tablet 3  . ticagrelor (BRILINTA) 90 MG TABS tablet Take 1 tablet (90 mg total) by mouth 2 (two) times daily. 60 tablet 3   No current facility-administered medications on file prior to visit.     No Known Allergies  Social History   Social History  . Marital status: Married    Spouse name: N/A  . Number of children: N/A  . Years of education: N/A   Occupational History  . Not on file.   Social  History Main Topics  . Smoking status: Former Smoker    Packs/day: 0.25    Years: 20.00    Types: Cigarettes    Quit date: 11/09/1998  . Smokeless tobacco: Never Used  . Alcohol use Yes     Comment: 12/23/2014 "might have a couple mixed drinks once/month"  . Drug use: No  . Sexual activity: Yes   Other Topics Concern  . Not on file   Social History Narrative  . No narrative on file    Family History  Problem Relation Age of Onset  . Cancer Mother     Past Surgical History:  Procedure Laterality Date  . CARDIAC CATHETERIZATION N/A 12/23/2014   Procedure: Left Heart Cath and Coronary Angiography;  Surgeon: Laurey Morale, MD;  Location: [facility] INVASIVE CV LAB;  Service: Cardiovascular;  Laterality: N/A;  . CARDIAC CATHETERIZATION N/A 12/23/2014   Procedure: Intravascular Pressure Wire/FFR Study;  Surgeon: Lyn Records, MD;  Location: [facility] INVASIVE CV LAB;  Service: Cardiovascular;  Laterality: N/A;  . CARDIAC CATHETERIZATION N/A 12/23/2014   Procedure: Coronary Stent Intervention;  Surgeon: Lyn Records, MD;  Location: [facility] INVASIVE CV LAB;  Service: Cardiovascular;  Laterality: N/A;  . CESAREAN SECTION  06/1995  . CORONARY ANGIOPLASTY WITH STENT PLACEMENT  12/23/2014   "1"  . 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   "fatty; upper leg"    ROS: Review of Systems As stated above  PHYSICAL EXAM: BP (!) 156/97   Pulse 72   Temp 98.2 F (36.8 C) (Oral)   Resp 16   Wt 189 lb 12.8 oz (86.1 kg)   LMP  (LMP Unknown)   SpO2 96%   BMI 34.71 kg/m   Physical Exam General appearance - alert, well appearing, and in no distress Mental status - alert, oriented to person, place, and time, normal mood, behavior, speech, dress, motor activity, and thought processes Mouth - mucous membranes moist, pharynx normal without lesions Neck - supple, no significant adenopathy Chest - clear to auscultation, no wheezes, rales or rhonchi, symmetric air entry Heart - normal rate, regular rhythm, normal S1, S2, no  murmurs, rubs, clicks or gallops Extremities - peripheral pulses normal, no pedal edema, no clubbing or cyanosis Diabetic Foot Exam - Simple   Simple Foot Form Visual Inspection See comments:  Yes Sensation Testing Intact to touch and monofilament testing bilaterally:  Yes Pulse Check Posterior Tibialis and Dorsalis pulse intact bilaterally:  Yes Comments Flat feet.  Small non-inflammed bunion RT foot.  Results for orders placed or performed in visit on 01/10/17  POCT glucose (manual entry)  Result Value Ref Range   POC Glucose 401 (A) 70 - 99 mg/dl  POCT glucose (manual entry)  Result Value Ref Range   POC Glucose 420 (A) 70 - 99 mg/dl  POCT glucose (manual entry)  Result Value Ref Range   POC Glucose 331 (A) 70 - 99 mg/dl     Chemistry      Component Value Date/Time   NA 130 (L) 12/20/2016 1509   K 4.8 12/20/2016 1509   CL 90 (L) 12/20/2016 1509   CO2 23 12/20/2016 1509   BUN 31 (H) 12/20/2016 1509   CREATININE 1.26 (H) 12/20/2016 1509      Component Value Date/Time   CALCIUM 10.2 12/20/2016 1509   ALKPHOS 114 12/20/2016 1509   AST 13 12/20/2016 1509   ALT 10 12/20/2016 1509   BILITOT 0.7 12/20/2016 1509       ASSESSMENT AND PLAN: 1. Uncontrolled type 2 diabetes mellitus without complication, with long-term current use of insulin (HCC) -Patient given a total of 21 units of NovoLog insulin today with decrease in blood sugars from 420 to 331.  UA negative for ketones -Ideally would like to add a short-acting insulin to the Lantus but cost is an issue for her. So we will increase Lantus to 26 units and instructed patient on self titration every 3 days by 3 units until a.m blood sugars are below 150.   -Increase metformin 1 gram a.m and 500 mg in p.m Pt to continue checking BS and bring in readings on f/u visits in 2 wks - POCT glucose (manual entry) - Ambulatory referral to Ophthalmology - insulin aspart (novoLOG) injection 15 Units; Inject 0.15 mLs (15 Units  total) into the skin once. - Microalbumin / creatinine urine ratio - metFORMIN (GLUCOPHAGE) 1000 MG tablet; 1 tab PO Q a.m and 1/2 tab Q p.m  Dispense: 45 tablet; Refill: 6 - Insulin Glargine (BASAGLAR KWIKPEN) 100 UNIT/ML SOPN; Inject 0.26 mLs (26 Units total) into the skin at bedtime.  Dispense: 15 mL; Refill: 11 - POCT glucose (manual entry) - insulin aspart (novoLOG) injection 6 Units; Inject 0.06 mLs (6 Units total) into the skin once. - POCT urinalysis dipstick - POCT glucose (manual entry)  2. Essential hypertension -not at goal but reported ambulatory readings are good.  Pt to log readings and bring in on next visit  3. Stage 3 chronic kidney disease -discussed importance of good blood pressure and DM control  4. Coronary artery disease involving native coronary artery of native heart without angina pectoris -given stent placement was over 1 yr ago and pt can no longer afford Brilinta, I advise pt to ask cardiology whether she still needs to be on ASA and Brilinta.  If she still needs DAPT, may consider Plavix which may be cheaper for her  5. Breast cancer screening - MM Digital Screening; Future  6. Colon cancer screening GI referral   Patient was given the opportunity to ask questions.  Patient verbalized understanding of the plan and was able to repeat key elements of the plan.   Orders Placed This Encounter  Procedures  . MM Digital Screening  . Microalbumin / creatinine urine ratio  . Ambulatory referral to Ophthalmology  . POCT glucose (manual entry)  . POCT glucose (manual entry)  . POCT urinalysis dipstick  . POCT glucose (manual entry)     Requested Prescriptions   Signed Prescriptions Disp Refills  .  metFORMIN (GLUCOPHAGE) 1000 MG tablet 45 tablet 6    Sig: 1 tab PO Q a.m and 1/2 tab Q p.m  . Insulin Glargine (BASAGLAR KWIKPEN) 100 UNIT/ML SOPN 15 mL 11    Sig: Inject 0.26 mLs (26 Units total) into the skin at bedtime.    Return in about 1 week  (around 01/17/2017).  Jonah Blue, MD, FACP

## 2017-01-10 NOTE — Patient Instructions (Signed)
Increase Lantus to 26 units at bedtime..  Increase Lantus by 3 units every 3 days until morning blood sugars are below 150. Increase metformin 1000 mg  in the morning and 500 mg in the evenings. Continue to monitor your blood sugars and bring them in on your next visit in 2 weeks.  Continue to check your blood pressure every day and bring in those readings in 2 weeks for my review.  Ask your cardiologist whether he still needs to be on the Brilinta. If you do and Cannot afford it ask about changing it to Plavix.  Follow a Healthy Eating Plan - You can do it! Limit sugary drinks.  Avoid sodas, sweet tea, sport or energy drinks, or fruit drinks.  Drink water, lo-fat milk, or diet drinks. Limit snack foods.   Cut back on candy, cake, cookies, chips, ice cream.  These are a special treat, only in small amounts. Eat plenty of vegetables.  Especially dark green, red, and orange vegetables. Aim for at least 3 servings a day. More is better! Include fruit in your daily diet.  Whole fruit is much healthier than fruit juice! Limit "white" bread, "white" pasta, "white" rice.   Choose "100% whole grain" products, brown or wild rice. Avoid fatty meats. Try "Meatless Monday" and choose eggs or beans one day a week.  When eating meat, choose lean meats like chicken, [place], and fish.  Grill, broil, or bake meats instead of frying, and eat poultry without the skin. Eat less salt.  Avoid frozen pizzas, frozen dinners and salty foods.  Use seasonings other than salt in cooking.  This can help blood pressure and keep you from swelling Beer, wine and liquor have calories.  If you can safely drink alcohol, limit to 1 drink per day for women, 2 drinks for men

## 2017-01-11 LAB — MICROALBUMIN / CREATININE URINE RATIO
Creatinine, Urine: 71.5 mg/dL
Microalb/Creat Ratio: 34.7 mg/g creat — ABNORMAL HIGH (ref 0.0–30.0)
Microalbumin, Urine: 24.8 ug/mL

## 2017-01-12 ENCOUNTER — Encounter: Payer: Self-pay | Admitting: Internal Medicine

## 2017-01-12 NOTE — Progress Notes (Signed)
Pt with mild microalbumin.  Will address on f/u visit.

## 2017-01-23 ENCOUNTER — Ambulatory Visit: Payer: 59 | Attending: Internal Medicine | Admitting: Internal Medicine

## 2017-01-23 ENCOUNTER — Encounter: Payer: Self-pay | Admitting: Internal Medicine

## 2017-01-23 VITALS — BP 130/86 | HR 70 | Temp 98.3°F | Resp 16 | Wt 188.6 lb

## 2017-01-23 DIAGNOSIS — R079 Chest pain, unspecified: Secondary | ICD-10-CM | POA: Insufficient documentation

## 2017-01-23 DIAGNOSIS — N183 Chronic kidney disease, stage 3 unspecified: Secondary | ICD-10-CM

## 2017-01-23 DIAGNOSIS — Z87891 Personal history of nicotine dependence: Secondary | ICD-10-CM | POA: Diagnosis not present

## 2017-01-23 DIAGNOSIS — Z955 Presence of coronary angioplasty implant and graft: Secondary | ICD-10-CM | POA: Insufficient documentation

## 2017-01-23 DIAGNOSIS — E1122 Type 2 diabetes mellitus with diabetic chronic kidney disease: Secondary | ICD-10-CM | POA: Diagnosis not present

## 2017-01-23 DIAGNOSIS — I5022 Chronic systolic (congestive) heart failure: Secondary | ICD-10-CM | POA: Diagnosis not present

## 2017-01-23 DIAGNOSIS — Z9889 Other specified postprocedural states: Secondary | ICD-10-CM | POA: Diagnosis not present

## 2017-01-23 DIAGNOSIS — Z809 Family history of malignant neoplasm, unspecified: Secondary | ICD-10-CM | POA: Diagnosis not present

## 2017-01-23 DIAGNOSIS — E1165 Type 2 diabetes mellitus with hyperglycemia: Secondary | ICD-10-CM | POA: Diagnosis not present

## 2017-01-23 DIAGNOSIS — E118 Type 2 diabetes mellitus with unspecified complications: Secondary | ICD-10-CM

## 2017-01-23 DIAGNOSIS — Z6830 Body mass index (BMI) 30.0-30.9, adult: Secondary | ICD-10-CM | POA: Diagnosis not present

## 2017-01-23 DIAGNOSIS — I10 Essential (primary) hypertension: Secondary | ICD-10-CM

## 2017-01-23 DIAGNOSIS — Z79899 Other long term (current) drug therapy: Secondary | ICD-10-CM | POA: Insufficient documentation

## 2017-01-23 DIAGNOSIS — Z794 Long term (current) use of insulin: Secondary | ICD-10-CM | POA: Diagnosis not present

## 2017-01-23 DIAGNOSIS — G4733 Obstructive sleep apnea (adult) (pediatric): Secondary | ICD-10-CM | POA: Insufficient documentation

## 2017-01-23 DIAGNOSIS — I2510 Atherosclerotic heart disease of native coronary artery without angina pectoris: Secondary | ICD-10-CM | POA: Diagnosis not present

## 2017-01-23 DIAGNOSIS — Z7982 Long term (current) use of aspirin: Secondary | ICD-10-CM | POA: Insufficient documentation

## 2017-01-23 DIAGNOSIS — I130 Hypertensive heart and chronic kidney disease with heart failure and stage 1 through stage 4 chronic kidney disease, or unspecified chronic kidney disease: Secondary | ICD-10-CM | POA: Diagnosis not present

## 2017-01-23 DIAGNOSIS — N179 Acute kidney failure, unspecified: Secondary | ICD-10-CM | POA: Diagnosis not present

## 2017-01-23 DIAGNOSIS — E669 Obesity, unspecified: Secondary | ICD-10-CM | POA: Diagnosis not present

## 2017-01-23 DIAGNOSIS — IMO0002 Reserved for concepts with insufficient information to code with codable children: Secondary | ICD-10-CM

## 2017-01-23 LAB — POCT URINALYSIS DIPSTICK
Bilirubin, UA: NEGATIVE
Blood, UA: NEGATIVE
Glucose, UA: 500
Ketones, UA: NEGATIVE
Leukocytes, UA: NEGATIVE
Nitrite, UA: NEGATIVE
Protein, UA: NEGATIVE
Spec Grav, UA: 1.01 (ref 1.010–1.025)
Urobilinogen, UA: 0.2 E.U./dL
pH, UA: 5 (ref 5.0–8.0)

## 2017-01-23 LAB — GLUCOSE, POCT (MANUAL RESULT ENTRY)
POC Glucose: 435 mg/dl — AB (ref 70–99)
POC Glucose: 327 mg/dl — AB (ref 70–99)

## 2017-01-23 MED ORDER — INSULIN ASPART 100 UNIT/ML ~~LOC~~ SOLN
20.0000 [IU] | Freq: Once | SUBCUTANEOUS | Status: AC
Start: 2017-01-23 — End: 2017-01-23
  Administered 2017-01-23: 20 [IU] via SUBCUTANEOUS

## 2017-01-23 MED ORDER — BASAGLAR KWIKPEN 100 UNIT/ML ~~LOC~~ SOPN: 46.0000 [IU] | PEN_INJECTOR | Freq: Every day | SUBCUTANEOUS | 11 refills | Status: DC

## 2017-01-23 MED ORDER — INSULIN ASPART 100 UNIT/ML FLEXPEN: 15.0000 [IU] | PEN_INJECTOR | Freq: Three times a day (TID) | SUBCUTANEOUS | 11 refills | Status: DC

## 2017-01-23 NOTE — Progress Notes (Signed)
Patient ID: Misty Miller, female    DOB: 1961/01/08  MRN: [id number]  CC: Hypertension and Diabetes   Subjective: Misty Miller is a 56 y.o. female who presents for short term f/u DM and HTN Her concerns today include:  Patient with history of HTN, diabetes type 2, chronic systolic CHF with EF 55-60% as of 11/2016, coronary artery disease with stent to RCA, obstructive sleep apnea on CPAP and obesity. Previous PCP was at [facility].  Last seen sometime last yr.  Pt loss job.  1.DM: Lantus self titrated to 40 units since last visit -checking BS 3 x a day.  Range 200-400. She has glucometer with her for review of blood sugar readings -tolerating Metformin ok -doing better with eating habits. Has cut out sugar fruits, rice, bread, fries.  Eating more green veggies.  Last ate this morning -eye appt tomorrow  2.  Appt for colonoscopy 02/01/2017 Miss call to schedule [facility]. Plans to call this week to reschedule  3. HTN -checks BP 2 x a day.   Range 120-130 and DBP 70-90 but more so 70-80s. -not using salt in foods -Denies chest pains or shortness of breath.  Patient Active Problem List   Diagnosis Date Noted  . Obesity (BMI 30-39.9) 04/08/2015  . OSA (obstructive sleep apnea)   . OSA on CPAP 03/05/2015  . CAD in native artery 12/23/2014  . Chest pain at rest 12/18/2014  . Chronic systolic CHF (congestive heart failure) (HCC) 09/01/2014  . CKD (chronic kidney disease) 09/01/2014  . Acute kidney injury (HCC) 08/17/2014  . Flash pulmonary edema (HCC) 08/16/2014  . CHF (congestive heart failure) (HCC) 08/16/2014  . Hypertensive emergency 08/16/2014  . HTN (hypertension) 08/16/2014  . DM (diabetes mellitus), type 2, uncontrolled (HCC) 08/16/2014  . At risk for sleep apnea 08/16/2014     Current Outpatient Prescriptions on File Prior to Visit  Medication Sig Dispense Refill  . amLODipine (NORVASC) 10 MG tablet Take 10 mg by mouth daily.    Marland Kitchen aspirin EC 81 MG EC tablet  Take 1 tablet (81 mg total) by mouth daily.    Marland Kitchen atorvastatin (LIPITOR) 40 MG tablet Take 1 tablet (40 mg total) by mouth daily at 6 PM. 30 tablet 3  . carvedilol (COREG) 25 MG tablet Take 25 mg by mouth 2 (two) times daily with a meal.    . cloNIDine (CATAPRES) 0.1 MG tablet TAKE 1 TABLET (0.1 MG TOTAL) BY MOUTH 2 (TWO) TIMES DAILY. 180 tablet 1  . furosemide (LASIX) 40 MG tablet Take 1 tablet (40 mg total) by mouth daily. 90 tablet 3  . metFORMIN (GLUCOPHAGE) 1000 MG tablet 1 tab PO Q a.m and 1/2 tab Q p.m 45 tablet 6  . ONE TOUCH ULTRA TEST test strip USE TO TEST BLOOD SUGAR TWICE DAILY  3  . spironolactone (ALDACTONE) 50 MG tablet Take 1 tablet (50 mg total) by mouth daily. 90 tablet 3  . ticagrelor (BRILINTA) 90 MG TABS tablet Take 1 tablet (90 mg total) by mouth 2 (two) times daily. 60 tablet 3   Current Facility-Administered Medications on File Prior to Visit  Medication Dose Route Frequency Provider Last Rate Last Dose  . insulin aspart (novoLOG) injection 6 Units  6 Units Subcutaneous Once Marcine Matar, MD        No Known Allergies  Social History   Social History  . Marital status: Married    Spouse name: N/A  . Number of children: N/A  .  Years of education: N/A   Occupational History  . Not on file.   Social History Main Topics  . Smoking status: Former Smoker    Packs/day: 0.25    Years: 20.00    Types: Cigarettes    Quit date: 11/09/1998  . Smokeless tobacco: Never Used  . Alcohol use Yes     Comment: 12/23/2014 "might have a couple mixed drinks once/month"  . Drug use: No  . Sexual activity: Yes   Other Topics Concern  . Not on file   Social History Narrative  . No narrative on file    Family History  Problem Relation Age of Onset  . Cancer Mother     Past Surgical History:  Procedure Laterality Date  . CARDIAC CATHETERIZATION N/A 12/23/2014   Procedure: Left Heart Cath and Coronary Angiography;  Surgeon: Laurey Morale, MD;  Location: [facility]  INVASIVE CV LAB;  Service: Cardiovascular;  Laterality: N/A;  . CARDIAC CATHETERIZATION N/A 12/23/2014   Procedure: Intravascular Pressure Wire/FFR Study;  Surgeon: Lyn Records, MD;  Location: [facility] INVASIVE CV LAB;  Service: Cardiovascular;  Laterality: N/A;  . CARDIAC CATHETERIZATION N/A 12/23/2014   Procedure: Coronary Stent Intervention;  Surgeon: Lyn Records, MD;  Location: [facility] INVASIVE CV LAB;  Service: Cardiovascular;  Laterality: N/A;  . CESAREAN SECTION  06/1995  . CORONARY ANGIOPLASTY WITH STENT PLACEMENT  12/23/2014   "1"  . 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   "fatty; upper leg"    ROS: Review of Systems As above PHYSICAL EXAM: BP (!) 144/79   Pulse 70   Temp 98.3 F (36.8 C) (Oral)   Resp 16   Wt 188 lb 9.6 oz (85.5 kg)   LMP  (LMP Unknown)   SpO2 96%   BMI 34.50 kg/m   BP 130/86 Wt Readings from Last 3 Encounters:  01/23/17 188 lb 9.6 oz (85.5 kg)  01/10/17 189 lb 12.8 oz (86.1 kg)  12/20/16 185 lb 3.2 oz (84 kg)    Physical Exam  General appearance - alert, well appearing, and in no distress Mental status - alert, oriented to person, place, and time, normal mood, behavior, speech, dress, motor activity, and thought processes Neck - supple, no significant adenopathy Chest - clear to auscultation, no wheezes, rales or rhonchi, symmetric air entry Heart - normal rate, regular rhythm, normal S1, S2, no murmurs, rubs, clicks or gallops Extremities - peripheral pulses normal, no pedal edema, no clubbing or cyanosis   Results for orders placed or performed in visit on 01/23/17  POCT glucose (manual entry)  Result Value Ref Range   POC Glucose 435 (A) 70 - 99 mg/dl  POCT urinalysis dipstick  Result Value Ref Range   Color, UA yellow    Clarity, UA clear    Glucose, UA 500    Bilirubin, UA negative    Ketones, UA negative    Spec Grav, UA 1.010 1.010 - 1.025   Blood, UA negative    pH, UA 5.0 5.0 - 8.0   Protein, UA negative    Urobilinogen, UA 0.2 0.2 or 1.0  E.U./dL   Nitrite, UA negative    Leukocytes, UA Negative Negative  POCT glucose (manual entry)  Result Value Ref Range   POC Glucose 327 (A) 70 - 99 mg/dl     ASSESSMENT AND PLAN: 1. Uncontrolled type 2 diabetes mellitus with complication, with long-term current use of insulin (HCC) -Commended her on dietary changes. However blood sugars continue to run high. -Increase Lantus to  46 units and add NovoLog 15 units 3 times a day with meals. Will not increase metformin  because of kidney function -Patient given 20 units of NovoLog here in the office today and several cups of water. Urine was negative for ketones. Will check BMP -Keep eye appointment - POCT glucose (manual entry) - POCT urinalysis dipstick - insulin aspart (novoLOG) injection 20 Units; Inject 0.2 mLs (20 Units total) into the skin once. - insulin aspart (NOVOLOG FLEXPEN) 100 UNIT/ML FlexPen; Inject 15 Units into the skin 3 (three) times daily with meals.  Dispense: 15 mL; Refill: 11 - Basic Metabolic Panel - Insulin Glargine (BASAGLAR KWIKPEN) 100 UNIT/ML SOPN; Inject 0.46 mLs (46 Units total) into the skin at bedtime.  Dispense: 15 mL; Refill: 11 - POCT glucose (manual entry)  2. Essential hypertension At goal -continue current meds  3. Stage 3 chronic kidney disease  HM: Keep appointment for colonoscopy. Patient reminded to call and reschedule mammogram.  Patient was given the opportunity to ask questions.  Patient verbalized understanding of the plan and was able to repeat key elements of the plan.   Orders Placed This Encounter  Procedures  . Basic Metabolic Panel  . POCT glucose (manual entry)  . POCT urinalysis dipstick  . POCT glucose (manual entry)     Requested Prescriptions   Signed Prescriptions Disp Refills  . insulin aspart (NOVOLOG FLEXPEN) 100 UNIT/ML FlexPen 15 mL 11    Sig: Inject 15 Units into the skin 3 (three) times daily with meals.  . Insulin Glargine (BASAGLAR KWIKPEN) 100 UNIT/ML  SOPN 15 mL 11    Sig: Inject 0.46 mLs (46 Units total) into the skin at bedtime.    Return in about 3 weeks (around 02/13/2017) for for pap and f/u DM.  Jonah Blue, MD, FACP

## 2017-01-23 NOTE — Patient Instructions (Signed)
Increase Lantus to 46 units daily. Start Novolog 15 units three times daily with meals.  Continue to monitor blood sugars 2-3 times a day.  Check blood sugars sometimes before meals and 2 hours after a meals.

## 2017-01-25 ENCOUNTER — Telehealth: Payer: Self-pay | Admitting: Internal Medicine

## 2017-01-25 LAB — BASIC METABOLIC PANEL
BUN/Creatinine Ratio: 17 (ref 9–23)
BUN: 20 mg/dL (ref 6–24)
CO2: 23 mmol/L (ref 20–29)
Calcium: 9.8 mg/dL (ref 8.7–10.2)
Chloride: 93 mmol/L — ABNORMAL LOW (ref 96–106)
Creatinine, Ser: 1.15 mg/dL — ABNORMAL HIGH (ref 0.57–1.00)
GFR calc Af Amer: 61 mL/min/{1.73_m2} (ref >59)
GFR calc non Af Amer: 53 mL/min/{1.73_m2} — ABNORMAL LOW (ref >59)
Glucose: 337 mg/dL — ABNORMAL HIGH (ref 65–99)
Potassium: 4 mmol/L (ref 3.5–5.2)
Sodium: 137 mmol/L (ref 134–144)

## 2017-01-25 NOTE — Telephone Encounter (Signed)
PC placed to pt this a.m after receiving results of BMP from two days ago today. Pt with increase gap at the time.  Pt was given 20 units of Novolog in the office that day.  Pt reports by the time she got home, BS was 107. Since then BS have been in the 200-300 range. She is taking Lantus 46 units and Metformin. She will not be able to purchase the Novolog until tomorrow morning.  In the meantime, I recommend increasing Lantus to 50 units daily. Denies any N/V or not feeling well at this time. Will check back with her later this week after she starts Novolog. Results for orders placed or performed in visit on 01/23/17  Basic Metabolic Panel  Result Value Ref Range   Glucose 337 (H) 65 - 99 mg/dL   BUN 20 6 - 24 mg/dL   Creatinine, Ser 4.96 (H) 0.57 - 1.00 mg/dL   GFR calc non Af Amer 53 (L) >59 mL/min/1.73   GFR calc Af Amer 61 >59 mL/min/1.73   BUN/Creatinine Ratio 17 9 - 23   Sodium 137 134 - 144 mmol/L   Potassium 4.0 3.5 - 5.2 mmol/L   Chloride 93 (L) 96 - 106 mmol/L   CO2 23 20 - 29 mmol/L   Calcium 9.8 8.7 - 10.2 mg/dL  POCT glucose (manual entry)  Result Value Ref Range   POC Glucose 435 (A) 70 - 99 mg/dl  POCT urinalysis dipstick  Result Value Ref Range   Color, UA yellow    Clarity, UA clear    Glucose, UA 500    Bilirubin, UA negative    Ketones, UA negative    Spec Grav, UA 1.010 1.010 - 1.025   Blood, UA negative    pH, UA 5.0 5.0 - 8.0   Protein, UA negative    Urobilinogen, UA 0.2 0.2 or 1.0 E.U./dL   Nitrite, UA negative    Leukocytes, UA Negative Negative  POCT glucose (manual entry)  Result Value Ref Range   POC Glucose 327 (A) 70 - 99 mg/dl

## 2017-01-29 ENCOUNTER — Telehealth: Payer: Self-pay | Admitting: Internal Medicine

## 2017-01-29 NOTE — Telephone Encounter (Signed)
PC placed to pt today to f/u on blood sugars and to inquire whether she got the Novolog.  I left message on home and cell phone letting her know this. Pt told to call office this week to let us know how BS are running.

## 2017-02-10 ENCOUNTER — Other Ambulatory Visit: Payer: Self-pay | Admitting: Cardiology

## 2017-02-14 ENCOUNTER — Ambulatory Visit: Payer: 59 | Admitting: Internal Medicine

## 2017-03-16 ENCOUNTER — Encounter (HOSPITAL_COMMUNITY): Payer: 59 | Admitting: Cardiology

## 2017-04-13 ENCOUNTER — Inpatient Hospital Stay (HOSPITAL_COMMUNITY): Admission: RE | Admit: 2017-04-13 | Payer: 59 | Source: Ambulatory Visit | Admitting: Cardiology

## 2017-05-15 ENCOUNTER — Other Ambulatory Visit (HOSPITAL_COMMUNITY): Payer: Self-pay | Admitting: Cardiology

## 2017-06-12 ENCOUNTER — Encounter: Payer: Self-pay | Admitting: Internal Medicine

## 2017-06-12 LAB — HM DIABETES EYE EXAM

## 2017-06-28 ENCOUNTER — Other Ambulatory Visit: Payer: Self-pay | Admitting: Cardiology

## 2017-06-28 DIAGNOSIS — IMO0001 Reserved for inherently not codable concepts without codable children: Secondary | ICD-10-CM

## 2017-06-28 DIAGNOSIS — Z794 Long term (current) use of insulin: Principal | ICD-10-CM

## 2017-06-28 DIAGNOSIS — E1165 Type 2 diabetes mellitus with hyperglycemia: Principal | ICD-10-CM

## 2017-08-12 ENCOUNTER — Other Ambulatory Visit (HOSPITAL_COMMUNITY): Payer: Self-pay | Admitting: Cardiology

## 2017-08-21 MED FILL — ?FUROSEMIDE 40 MG TABLET: 40 | 30 days supply | Qty: 30 | Fill #1

## 2017-12-25 ENCOUNTER — Other Ambulatory Visit: Payer: Self-pay | Admitting: Physician Assistant

## 2017-12-26 ENCOUNTER — Other Ambulatory Visit (HOSPITAL_COMMUNITY): Payer: Self-pay | Admitting: *Deleted

## 2017-12-26 MED ORDER — FUROSEMIDE 40 MG PO TABS: 40.0000 mg | ORAL_TABLET | Freq: Every day | ORAL | 3 refills | Status: DC

## 2017-12-26 MED ORDER — ATORVASTATIN CALCIUM 40 MG PO TABS: 40.0000 mg | ORAL_TABLET | Freq: Every day | ORAL | 3 refills | Status: DC

## 2018-01-12 MED FILL — FUROSEMIDE 40 MG TAB: 40 | 30 days supply | Qty: 30 | Fill #0

## 2018-01-12 MED FILL — ATORVASTATIN CALCIUM 40 MG: 40 | 30 days supply | Qty: 30 | Fill #0

## 2018-03-09 ENCOUNTER — Ambulatory Visit (HOSPITAL_COMMUNITY)
Admission: RE | Admit: 2018-03-09 | Discharge: 2018-03-09 | Disposition: A | Discharge status: Home / Self Care | Payer: Self-pay | Source: Ambulatory Visit | Attending: Cardiology | Admitting: Cardiology

## 2018-03-09 ENCOUNTER — Other Ambulatory Visit: Payer: Self-pay

## 2018-03-09 ENCOUNTER — Encounter (HOSPITAL_COMMUNITY): Payer: Self-pay | Admitting: Cardiology

## 2018-03-09 VITALS — BP 168/92 | HR 80 | Wt 173.2 lb

## 2018-03-09 DIAGNOSIS — Z79899 Other long term (current) drug therapy: Secondary | ICD-10-CM | POA: Insufficient documentation

## 2018-03-09 DIAGNOSIS — I5022 Chronic systolic (congestive) heart failure: Secondary | ICD-10-CM | POA: Insufficient documentation

## 2018-03-09 DIAGNOSIS — G4733 Obstructive sleep apnea (adult) (pediatric): Secondary | ICD-10-CM | POA: Insufficient documentation

## 2018-03-09 DIAGNOSIS — E119 Type 2 diabetes mellitus without complications: Secondary | ICD-10-CM | POA: Insufficient documentation

## 2018-03-09 DIAGNOSIS — Z7982 Long term (current) use of aspirin: Secondary | ICD-10-CM | POA: Insufficient documentation

## 2018-03-09 DIAGNOSIS — Z794 Long term (current) use of insulin: Secondary | ICD-10-CM | POA: Insufficient documentation

## 2018-03-09 DIAGNOSIS — E785 Hyperlipidemia, unspecified: Secondary | ICD-10-CM | POA: Insufficient documentation

## 2018-03-09 DIAGNOSIS — Z7902 Long term (current) use of antithrombotics/antiplatelets: Secondary | ICD-10-CM | POA: Insufficient documentation

## 2018-03-09 DIAGNOSIS — I110 Hypertensive heart disease with heart failure: Secondary | ICD-10-CM | POA: Insufficient documentation

## 2018-03-09 DIAGNOSIS — I2510 Atherosclerotic heart disease of native coronary artery without angina pectoris: Secondary | ICD-10-CM | POA: Insufficient documentation

## 2018-03-09 LAB — BASIC METABOLIC PANEL
Anion gap: 11 (ref 5–15)
BUN: 16 mg/dL (ref 6–20)
CO2: 29 mmol/L (ref 22–32)
Calcium: 9 mg/dL (ref 8.9–10.3)
Chloride: 98 mmol/L (ref 98–111)
Creatinine, Ser: 0.98 mg/dL (ref 0.44–1.00)
GFR calc Af Amer: >60 mL/min (ref >60)
GFR calc non Af Amer: >60 mL/min (ref >60)
Glucose, Bld: 396 mg/dL — ABNORMAL HIGH (ref 70–99)
Potassium: 3.6 mmol/L (ref 3.5–5.1)
Sodium: 138 mmol/L (ref 135–145)

## 2018-03-09 LAB — LIPID PANEL
Cholesterol: 165 mg/dL (ref 0–200)
HDL: 42 mg/dL (ref >40)
LDL Cholesterol: 89 mg/dL (ref 0–99)
Total CHOL/HDL Ratio: 3.9 RATIO
Triglycerides: 171 mg/dL — ABNORMAL HIGH (ref <150)
VLDL: 34 mg/dL (ref 0–40)

## 2018-03-09 MED ORDER — HYDRALAZINE HCL 25 MG PO TABS: 25.0000 mg | ORAL_TABLET | Freq: Three times a day (TID) | ORAL | 11 refills | Status: DC

## 2018-03-09 MED FILL — hydrALAZINE HCL 25 MG TABS: 25 | 30 days supply | Qty: 90 | Fill #0

## 2018-03-09 NOTE — Patient Instructions (Signed)
Stay off Brilinta.  START Hydralazine 25 mg tablet three times daily (Take 1 tab every 8 hours).  Routine lab work today. Will notify you of abnormal results, otherwise no news is good news!  Follow up 6 months with Dr. Shirlee Latch. We will call you closer to this time, or you may call our office to schedule 1 month before you are due to be seen. Take all medication as prescribed the day of your appointment. Bring all medications with you to your appointment.  Do the following things EVERYDAY: 1) Weigh yourself in the morning before breakfast. Write it down and keep it in a log. 2) Take your medicines as prescribed 3) Eat low salt foods-Limit salt (sodium) to 2000 mg per day.  4) Stay as active as you can everyday 5) Limit all fluids for the day to less than 2 liters

## 2018-03-11 NOTE — Progress Notes (Signed)
Patient ID: Misty Miller, female   DOB: 1960/10/26, 57 y.o.   MRN: [id number] PCP: Dr. Wylene Simmer Cardiology: Dr. Shirlee Latch  57 y.o. with poorly controlled HTN and CHF presents for cardiology followup.  She has had HTN and diabetes for years.  She was admitted to [facility] in 2/16 with a hypertensive emergency and flash pulmonary edema. She had been off irbesartan for months.  She was initially given IV Lasix (just one dose) and started back on irbesartan.  Creatinine rose from 0.95 at admission to 2.71.  Irbesartan and Lasix were stopped.  Echo was done, showing EF 35-40% with basal inferior akinesis and mild to moderate MR.  Troponin was mildly elevated to peak 0.35.  Due to elevated creatinine, she did not have cardiac cath initially.  Lexiscan Cardiolite was done, showing EF 35% but no ischemia or infarction.  V/Q scan was normal.  BP was controlled and she was sent home. In 6/16, she finally had cardiac cath showing severe RCA stenosis that was treated with DES.  Echo in 5/17 showed improvement in EF back to 55%, echo in 5/18 with EF 55-60%.   She returns for followup of CHF and CAD.  She has been doing well, weight has trended down.  BP still on the high side with SBP 130s-140s.  She is exercising 3 days/week, walks a track and work out at [employer].  No significant exertional dyspnea.  No chest pain.  No orthopnea/PND.  Blood glucose continues to run high.    Labs (2/16): K 4.3, creatinine 0.95 => 2.71 => 1.78, TnI 0.35, Hgb 10, LDL 149, HCT 31, TSH normal, plasma aldosterone 1, urine catecholeamines normal.  Labs (3/16): K 4, creatinine 1.11 Labs (4/16): K 3.9, creatinine 1.09 Labs (12/24/14): K 4.1, creatinine 1.08, P2Y12 261 Labs 02/23/2015: K 3.6 Creatinine 0.90, HCT 35.4, BNP 18 Labs (10/16): K 4.1, creatinine 1.03, BNP 14 Labs (2/18): Creatinine 1.09, K 4.1 Labs (7/18): K 4, creatinine 1.15  PMH: 1. HTN: For > 18 yrs, poor control historically. Renal artery dopplers showed no renal  artery stenosis (2/16).   2. Type II diabetes. 3. 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  Admitted 2/16 with hypertensive emergency and pulmonary edema.  Echo (2/16) with EF 35-40%, basal inferior akinesis, mild-moderate MR.  Lexiscan Cardiolite (2/16) with EF 35%, no ischemia or infarction but LHC showed severe RCA disease, as below.    - Echo (5/17): EF 55%, mild LVH.  - Echo (5/18): EF 55-60%, mild MR 4. Hyperlipidemia 5. AKI/CKD: AKI in 2/16 in the setting of ARB and diuresis.  6. CAD: LHC (6/16) with 90% proximal RCA, 50% mRCA, 60% dRCA.  Promus DES to proximal RCA.  7. OSA: Moderate, using CPAP. 8. Inadequate platelet inhibition with Plavix.  9. Palpitations: Holter in 4/16 showed primarily NSR, rare PVCs, with 10 beat run of AIVR.  SH: Married, unemployed, nonsmoker, no ETOH.   FH: Diabetes and HTN in multiple family members.  Grandfather MI at 71.  Mother with CHF, no history of MI.  ROS: All systems reviewed and negative except as per HPI.   Current Outpatient Medications  Medication Sig Dispense Refill  . amLODipine (NORVASC) 10 MG tablet Take 10 mg by mouth daily.    Marland Kitchen aspirin EC 81 MG EC tablet Take 1 tablet (81 mg total) by mouth daily.    Marland Kitchen atorvastatin (LIPITOR) 40 MG tablet TAKE 1 TABLET (40 MG TOTAL) BY MOUTH DAILY AT 6 PM. 30 tablet  3  . atorvastatin (LIPITOR) 40 MG tablet Take 1 tablet (40 mg total) by mouth daily at 6 PM. 30 tablet 3  . carvedilol (COREG) 25 MG tablet Take 25 mg by mouth 2 (two) times daily with a meal.    . cloNIDine (CATAPRES) 0.1 MG tablet TAKE 1 TABLET (0.1 MG TOTAL) BY MOUTH 2 (TWO) TIMES DAILY. 180 tablet 1  . furosemide (LASIX) 40 MG tablet TAKE 1 TABLET BY MOUTH EVERY DAY 90 tablet 3  . insulin aspart (NOVOLOG FLEXPEN) 100 UNIT/ML FlexPen Inject 15 Units into the skin 3 (three) times daily with meals. 15 mL 11  . Insulin Glargine (BASAGLAR KWIKPEN) 100 UNIT/ML SOPN Inject 0.46 mLs (46 Units total) into the  skin at bedtime. 15 mL 11  . metFORMIN (GLUCOPHAGE) 1000 MG tablet 1 tab PO Q a.m and 1/2 tab Q p.m 45 tablet 6  . ONE TOUCH ULTRA TEST test strip USE TO TEST BLOOD SUGAR TWICE DAILY  3  . spironolactone (ALDACTONE) 50 MG tablet Take 1 tablet (50 mg total) by mouth daily. 90 tablet 3  . hydrALAZINE (APRESOLINE) 25 MG tablet Take 1 tablet (25 mg total) by mouth 3 (three) times daily. 90 tablet 11   Current Facility-Administered Medications  Medication Dose Route Frequency Provider Last Rate Last Dose  . insulin aspart (novoLOG) injection 6 Units  6 Units Subcutaneous Once Marcine Matar, MD       BP (!) 168/92   Pulse 80   Wt 78.6 kg (173 lb 4 oz)   LMP  (LMP Unknown)   SpO2 100%   BMI 31.69 kg/m  General: NAD Neck: No JVD, no thyromegaly or thyroid nodule.  Lungs: Clear to auscultation bilaterally with normal respiratory effort. CV: Nondisplaced PMI.  Heart regular S1/S2, no S3/S4, no murmur.  No peripheral edema.  No carotid bruit.  Normal pedal pulses.  Abdomen: Soft, nontender, no hepatosplenomegaly, no distention.  Skin: Intact without lesions or rashes.  Neurologic: Alert and oriented x 3.  Psych: Normal affect. Extremities: No clubbing or cyanosis.  HEENT: Normal.   Assessment/Plan: 1. Chronic systolic CHF: EF 03-50% by echo 08/17/14.  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  HTN has been treated and she is s/p DES to RCA, and EF on 5/17 echo was improved back to 55%.  Echo in 5/18 was similar with EF 55-60%. She is not volume overloaded on exam.    - Continue Coreg 25 mg bid.  - Continue spironolactone 50 mg daily. BMET today.  2. CAD: LHC 12/23/14 showed severe proximal RCA stenosis treated with Promus DES.  She was a Plavix non-responder so was transitioned to Brilinta 90 mg bid => now off Brilinta.  No chest pain.  - Continue ASA 81 and atorvastatin 40.  Check lipids.   3. HTN: BP continues to run high.  - Add hydralazine 25 mg tid (she had marked  AKI with ARB use in the past).  4. CKD: AKI in past after starting ARB, but fully recovered.  No evidence for renal artery stenosis on renal artery dopplers.  Suspect some baseline renal damage from long-standing HTN and diabetes but recent creatinine within normal range.    5. OSA: using CPAP.  Followup in 6 months   [name] 03/11/2018

## 2018-03-16 MED FILL — ATORVASTATIN CALCIUM 40 MG: 40 | 30 days supply | Qty: 30 | Fill #1

## 2018-03-16 MED FILL — FUROSEMIDE 40 MG TAB: 40 | 30 days supply | Qty: 30 | Fill #1

## 2018-03-29 ENCOUNTER — Telehealth (HOSPITAL_COMMUNITY): Payer: Self-pay | Admitting: *Deleted

## 2018-03-29 ENCOUNTER — Encounter (HOSPITAL_COMMUNITY): Payer: Self-pay | Admitting: *Deleted

## 2018-03-29 NOTE — Telephone Encounter (Signed)
Result Notes for Basic metabolic panel   Notes recorded by Georgina Peer, RN on 03/29/2018 at 11:47 AM EDT Called patient again but unable to leave VM and no answer this time. Will mail letter to patient. ------  Notes recorded by Georgina Peer, RN on 03/22/2018 at 10:59 AM EDT Called and left another message for patient to return our call. ------  Notes recorded by Georgina Peer, RN on 03/15/2018 at 3:35 PM EDT Called and left message asking for her to call us back. ------  Notes recorded by Laurey Morale, MD on 03/09/2018 at 4:03 PM EDT Increase atorvastatin to 80 mg daily. Lipids/LFTs in 2 months.

## 2018-05-04 MED FILL — FUROSEMIDE 40 MG TAB: 40 | 30 days supply | Qty: 30 | Fill #2

## 2018-05-07 MED FILL — ATORVASTATIN CALCIUM 40 MG: 40 | 30 days supply | Qty: 30 | Fill #2

## 2018-05-11 ENCOUNTER — Other Ambulatory Visit (HOSPITAL_COMMUNITY): Payer: Self-pay

## 2018-05-11 MED ORDER — SPIRONOLACTONE 50 MG PO TABS: 50.0000 mg | ORAL_TABLET | Freq: Every day | ORAL | 2 refills | Status: DC

## 2018-05-11 MED FILL — SPIRONOLACTONE 50 MG TABLET: 50 | 30 days supply | Qty: 30 | Fill #0

## 2018-06-20 MED FILL — SPIRONOLACTONE 50 MG TABLET: 50 | 30 days supply | Qty: 30 | Fill #1

## 2018-06-20 MED FILL — ATORVASTATIN CALCIUM 40 MG: 40 | 30 days supply | Qty: 30 | Fill #3

## 2018-06-20 MED FILL — FUROSEMIDE 40 MG TAB: 40 | 30 days supply | Qty: 30 | Fill #3

## 2018-06-20 MED FILL — hydrALAZINE HCL 25 MG TABS: 25 | 30 days supply | Qty: 90 | Fill #1

## 2018-08-23 ENCOUNTER — Encounter (HOSPITAL_COMMUNITY): Payer: Self-pay | Admitting: Cardiology

## 2018-08-23 ENCOUNTER — Ambulatory Visit (HOSPITAL_COMMUNITY)
Admission: RE | Admit: 2018-08-23 | Discharge: 2018-08-23 | Disposition: A | Discharge status: Home / Self Care | Payer: Self-pay | Source: Ambulatory Visit | Attending: Cardiology | Admitting: Cardiology

## 2018-08-23 VITALS — BP 162/98 | HR 108 | Wt 167.0 lb

## 2018-08-23 DIAGNOSIS — E1122 Type 2 diabetes mellitus with diabetic chronic kidney disease: Secondary | ICD-10-CM | POA: Insufficient documentation

## 2018-08-23 DIAGNOSIS — I5022 Chronic systolic (congestive) heart failure: Secondary | ICD-10-CM

## 2018-08-23 DIAGNOSIS — I10 Essential (primary) hypertension: Secondary | ICD-10-CM

## 2018-08-23 DIAGNOSIS — G4733 Obstructive sleep apnea (adult) (pediatric): Secondary | ICD-10-CM | POA: Insufficient documentation

## 2018-08-23 DIAGNOSIS — I2510 Atherosclerotic heart disease of native coronary artery without angina pectoris: Secondary | ICD-10-CM

## 2018-08-23 DIAGNOSIS — Z7982 Long term (current) use of aspirin: Secondary | ICD-10-CM | POA: Insufficient documentation

## 2018-08-23 DIAGNOSIS — I509 Heart failure, unspecified: Secondary | ICD-10-CM

## 2018-08-23 DIAGNOSIS — I130 Hypertensive heart and chronic kidney disease with heart failure and stage 1 through stage 4 chronic kidney disease, or unspecified chronic kidney disease: Secondary | ICD-10-CM | POA: Insufficient documentation

## 2018-08-23 DIAGNOSIS — Z833 Family history of diabetes mellitus: Secondary | ICD-10-CM | POA: Insufficient documentation

## 2018-08-23 DIAGNOSIS — N189 Chronic kidney disease, unspecified: Secondary | ICD-10-CM | POA: Insufficient documentation

## 2018-08-23 DIAGNOSIS — Z955 Presence of coronary angioplasty implant and graft: Secondary | ICD-10-CM | POA: Insufficient documentation

## 2018-08-23 DIAGNOSIS — Z8249 Family history of ischemic heart disease and other diseases of the circulatory system: Secondary | ICD-10-CM | POA: Insufficient documentation

## 2018-08-23 DIAGNOSIS — Z7901 Long term (current) use of anticoagulants: Secondary | ICD-10-CM | POA: Insufficient documentation

## 2018-08-23 DIAGNOSIS — E785 Hyperlipidemia, unspecified: Secondary | ICD-10-CM | POA: Insufficient documentation

## 2018-08-23 DIAGNOSIS — E1165 Type 2 diabetes mellitus with hyperglycemia: Secondary | ICD-10-CM

## 2018-08-23 DIAGNOSIS — Z79899 Other long term (current) drug therapy: Secondary | ICD-10-CM | POA: Insufficient documentation

## 2018-08-23 LAB — CBC
HCT: 37.5 % (ref 36.0–46.0)
Hemoglobin: 12.6 g/dL (ref 12.0–15.0)
MCH: 28.5 pg (ref 26.0–34.0)
MCHC: 33.6 g/dL (ref 30.0–36.0)
MCV: 84.8 fL (ref 80.0–100.0)
Platelets: 259 10*3/uL (ref 150–400)
RBC: 4.42 MIL/uL (ref 3.87–5.11)
RDW: 13.1 % (ref 11.5–15.5)
WBC: 7.5 10*3/uL (ref 4.0–10.5)
nRBC: 0 % (ref 0.0–0.2)

## 2018-08-23 LAB — BASIC METABOLIC PANEL
Anion gap: 14 (ref 5–15)
BUN: 13 mg/dL (ref 6–20)
CO2: 25 mmol/L (ref 22–32)
Calcium: 9.5 mg/dL (ref 8.9–10.3)
Chloride: 98 mmol/L (ref 98–111)
Creatinine, Ser: 0.99 mg/dL (ref 0.44–1.00)
GFR calc Af Amer: >60 mL/min (ref >60)
GFR calc non Af Amer: >60 mL/min (ref >60)
Glucose, Bld: 308 mg/dL — ABNORMAL HIGH (ref 70–99)
Potassium: 3.8 mmol/L (ref 3.5–5.1)
Sodium: 137 mmol/L (ref 135–145)

## 2018-08-23 LAB — LIPID PANEL
Cholesterol: 165 mg/dL (ref 0–200)
HDL: 48 mg/dL (ref >40)
LDL Cholesterol: 85 mg/dL (ref 0–99)
Total CHOL/HDL Ratio: 3.4 RATIO
Triglycerides: 160 mg/dL — ABNORMAL HIGH (ref <150)
VLDL: 32 mg/dL (ref 0–40)

## 2018-08-23 MED ORDER — AMLODIPINE BESYLATE 10 MG PO TABS: 10.0000 mg | ORAL_TABLET | Freq: Every day | ORAL | 0 refills | Status: DC

## 2018-08-23 MED ORDER — CARVEDILOL 25 MG PO TABS: 25.0000 mg | ORAL_TABLET | Freq: Two times a day (BID) | ORAL | 0 refills | Status: DC

## 2018-08-23 NOTE — Progress Notes (Signed)
Patient ID: Misty Miller, female   DOB: 02-Feb-1961, 58 y.o.   MRN: [id number] PCP: Dr. Wylene Simmer Cardiology: Dr. Shirlee Latch  58 y.o. with poorly controlled HTN and CHF presents for cardiology followup.  She has had HTN and diabetes for years.  She was admitted to [facility] in 2/16 with a hypertensive emergency and flash pulmonary edema. She had been off irbesartan for months.  She was initially given IV Lasix (just one dose) and started back on irbesartan.  Creatinine rose from 0.95 at admission to 2.71.  Irbesartan and Lasix were stopped.  Echo was done, showing EF 35-40% with basal inferior akinesis and mild to moderate MR.  Troponin was mildly elevated to peak 0.35.  Due to elevated creatinine, she did not have cardiac cath initially.  Lexiscan Cardiolite was done, showing EF 35% but no ischemia or infarction.  V/Q scan was normal.  BP was controlled and she was sent home. In 6/16, she finally had cardiac cath showing severe RCA stenosis that was treated with DES.  Echo in 5/17 showed improvement in EF back to 55%, echo in 5/18 with EF 55-60%.   She returns for followup of CHF and CAD.  Patient does not have health insurance any longer.  She has been off diabetes meds x 1 year. She has not been taking amlodipine and Coreg, and BP is significantly elevated. No chest pain.  No exertional dyspnea.  She walks daily for exercise.  Weight is down 6 lbs.   Labs (2/16): K 4.3, creatinine 0.95 => 2.71 => 1.78, TnI 0.35, Hgb 10, LDL 149, HCT 31, TSH normal, plasma aldosterone 1, urine catecholeamines normal.  Labs (3/16): K 4, creatinine 1.11 Labs (4/16): K 3.9, creatinine 1.09 Labs (12/24/14): K 4.1, creatinine 1.08, P2Y12 261 Labs 02/23/2015: K 3.6 Creatinine 0.90, HCT 35.4, BNP 18 Labs (10/16): K 4.1, creatinine 1.03, BNP 14 Labs (2/18): Creatinine 1.09, K 4.1 Labs (7/18): K 4, creatinine 1.15 Labs (8/19): LDL 89, K 3.6, creatinine 7.67  PMH: 1. HTN: For > 18 yrs, poor control historically. Renal artery  dopplers showed no renal artery stenosis (2/16).   2. Type II diabetes. 3. 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  Admitted 2/16 with hypertensive emergency and pulmonary edema.  Echo (2/16) with EF 35-40%, basal inferior akinesis, mild-moderate MR.  Lexiscan Cardiolite (2/16) with EF 35%, no ischemia or infarction but LHC showed severe RCA disease, as below.    - Echo (5/17): EF 55%, mild LVH.  - Echo (5/18): EF 55-60%, mild MR 4. Hyperlipidemia 5. AKI/CKD: AKI in 2/16 in the setting of ARB and diuresis.  6. CAD: LHC (6/16) with 90% proximal RCA, 50% mRCA, 60% dRCA.  Promus DES to proximal RCA.  7. OSA: Moderate, using CPAP. 8. Inadequate platelet inhibition with Plavix.  9. Palpitations: Holter in 4/16 showed primarily NSR, rare PVCs, with 10 beat run of AIVR.  SH: Married, unemployed, nonsmoker, no ETOH.   FH: Diabetes and HTN in multiple family members.  Grandfather MI at 43.  Mother with CHF, no history of MI.  ROS: All systems reviewed and negative except as per HPI.   Current Outpatient Medications  Medication Sig Dispense Refill  . aspirin EC 81 MG EC tablet Take 1 tablet (81 mg total) by mouth daily.    Marland Kitchen atorvastatin (LIPITOR) 40 MG tablet Take 1 tablet (40 mg total) by mouth daily at 6 PM. 30 tablet 3  . cloNIDine (CATAPRES) 0.1 MG tablet TAKE 1 TABLET (  0.1 MG TOTAL) BY MOUTH 2 (TWO) TIMES DAILY. 180 tablet 1  . furosemide (LASIX) 40 MG tablet TAKE 1 TABLET BY MOUTH EVERY DAY 90 tablet 3  . hydrALAZINE (APRESOLINE) 25 MG tablet Take 1 tablet (25 mg total) by mouth 3 (three) times daily. 90 tablet 11  . ONE TOUCH ULTRA TEST test strip USE TO TEST BLOOD SUGAR TWICE DAILY  3  . spironolactone (ALDACTONE) 50 MG tablet Take 1 tablet (50 mg total) by mouth daily. 90 tablet 2  . amLODipine (NORVASC) 10 MG tablet Take 1 tablet (10 mg total) by mouth daily. 90 tablet 0  . carvedilol (COREG) 25 MG tablet Take 1 tablet (25 mg total) by mouth 2 (two)  times daily with a meal. 180 tablet 0  . insulin aspart (NOVOLOG FLEXPEN) 100 UNIT/ML FlexPen Inject 15 Units into the skin 3 (three) times daily with meals. (Patient not taking: Reported on 08/23/2018) 15 mL 11  . Insulin Glargine (BASAGLAR KWIKPEN) 100 UNIT/ML SOPN Inject 0.46 mLs (46 Units total) into the skin at bedtime. (Patient not taking: Reported on 08/23/2018) 15 mL 11  . metFORMIN (GLUCOPHAGE) 1000 MG tablet 1 tab PO Q a.m and 1/2 tab Q p.m (Patient not taking: Reported on 08/23/2018) 45 tablet 6   Current Facility-Administered Medications  Medication Dose Route Frequency Provider Last Rate Last Dose  . insulin aspart (novoLOG) injection 6 Units  6 Units Subcutaneous Once Marcine MatarJohnson, Deborah B, MD       BP (!) 162/98   Pulse (!) 108   Wt 75.8 kg (167 lb)   LMP  (LMP Unknown)   SpO2 98%   BMI 30.54 kg/m  General: NAD Neck: No JVD, no thyromegaly or thyroid nodule.  Lungs: Clear to auscultation bilaterally with normal respiratory effort. CV: Nondisplaced PMI.  Heart regular S1/S2, +S4, no murmur.  No peripheral edema.  No carotid bruit.  Normal pedal pulses.  Abdomen: Soft, nontender, no hepatosplenomegaly, no distention.  Skin: Intact without lesions or rashes.  Neurologic: Alert and oriented x 3.  Psych: Normal affect. Extremities: No clubbing or cyanosis.  HEENT: Normal.   Assessment/Plan: 1. Chronic systolic CHF: EF 78-29%35-40% by echo 08/17/14.  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  HTN has been treated and she is s/p DES to RCA, and EF on 5/17 echo was improved back to 55%.  Echo in 5/18 was similar with EF 55-60%. She is not volume overloaded on exam.    - Restart Coreg 25 mg bid.   - Continue spironolactone 50 mg daily. BMET today.  2. CAD: LHC 12/23/14 showed severe proximal RCA stenosis treated with Promus DES.  She was a Plavix non-responder so was transitioned to Brilinta 90 mg bid => now off Brilinta.  No chest pain.  - Continue ASA 81 and atorvastatin  40.  Check lipids.   3. HTN:BP is high as she has been off Coreg and amlodipine.  - Restart amlodipine 10 mg daily and Coreg 25 mg bid.  - Continue hydralazine 25 mg tid  - She had marked AKI with ARB use in the past.  4. CKD: AKI in past after starting ARB, but fully recovered.  No evidence for renal artery stenosis on renal artery dopplers.  Suspect some baseline renal damage from long-standing HTN and diabetes but recent creatinine within normal range.    5. OSA: using CPAP. 6. Diabetes: She is not currently on any medications.  - I will [facility] and with endocrinology.  - Check hgb A1c today.  7. She has no health insurance, will have our social worker see her today.   Followup in CHF pharmacy clinic for BP check and medication titration in 3 wks, see me in 6 wks.   Marca AnconaDalton Desteni Piscopo 08/23/2018

## 2018-08-23 NOTE — Progress Notes (Signed)
CSW consulted to speak with pt about lack of insurance and need to get medications (esp insulin).  Pt reports she has not had insurance since 2012- has applied for disability before but was denied.  Pt now working part time but usually has to rely on help from her mother and daughter (who live with her) to pay for medications.  Pt reports that she gets medications through [facility] and they have provided her with a Blue Card application and pt states she has been gathering all the paperwork they need to complete- after todays appt she is motivated to turn this in tomorrow.  Pt states that her copays at [facility] are normally $80 for her mediations and she feels as if this is affordable at this time- plans to pick up her medications tomorrow as well as turn in [employer] application.  CSW then spoke with pt about lack of insurance coverage.  Pt not eligible for marketplace insurance at this time as it is not open enrollment.  Pt states she has a lot of outstanding medical bills through [facility] and they have sent her a notice about financial assistance- pt states she will complete this application as well and turn back into [facility] to see if she is eligible.  CSW will continue to follow and assist as needed- encouraged pt to call clinic if she encounters any roadblocks.  Burna Sis, LCSW Clinical Social Worker [facility] [phone]

## 2018-08-23 NOTE — Patient Instructions (Addendum)
Labs were done today. We will call you with any ABNORMAL results. No news is good news!  Restart Coreg 25mg  TWICE A DAY.  Restart Amlodipine 10mg  ONCE A DAY.  These prescriptions have been sent to [facility] on [address].  You have been referred to internal medicine. They will call you to schedule an appointment.  You have been referred to Endocrine clinic for management of your diabetes. They will call you to schedule your appointment.   Your physician recommends that you schedule a follow-up appointment in: 3 weeks with Pharmacist for medicine management  Your physician recommends that you schedule a follow-up appointment in: 6 weeks with Dr. Shirlee Latch.

## 2018-08-24 LAB — HEMOGLOBIN A1C
Hgb A1c MFr Bld: 10.9 % — ABNORMAL HIGH (ref 4.8–5.6)
Mean Plasma Glucose: 266 mg/dL

## 2018-08-27 ENCOUNTER — Other Ambulatory Visit (HOSPITAL_COMMUNITY): Payer: Self-pay

## 2018-08-27 DIAGNOSIS — I5022 Chronic systolic (congestive) heart failure: Secondary | ICD-10-CM

## 2018-08-28 ENCOUNTER — Telehealth: Payer: Self-pay | Admitting: Internal Medicine

## 2018-08-28 NOTE — Telephone Encounter (Signed)
Called the patient she asked about cafa and I informed her that she could walk in or schedule on Monday or walkin on Friday. She will see you march 19 at 2:30

## 2018-08-28 NOTE — Telephone Encounter (Signed)
-----   Message from Marcine Matar, MD sent at 08/24/2018  5:34 PM EST ----- Regarding: needs appt for f/u on DM

## 2018-09-06 ENCOUNTER — Encounter: Payer: Self-pay | Admitting: Internal Medicine

## 2018-09-14 ENCOUNTER — Other Ambulatory Visit (HOSPITAL_COMMUNITY): Payer: Self-pay | Admitting: Cardiology

## 2018-09-14 MED FILL — FUROSEMIDE 40 MG TAB: 40 | 30 days supply | Qty: 30 | Fill #0

## 2018-09-14 MED FILL — AMLODIPINE BESYLATE 10 MG T: 10 | 30 days supply | Qty: 30 | Fill #0

## 2018-09-14 MED FILL — CARVEDILOL 25 MG TABLET: 25 | 30 days supply | Qty: 60 | Fill #0

## 2018-09-15 NOTE — Progress Notes (Signed)
PCP: Dr. Wylene Simmer Cardiology: Dr. Shirlee Latch  HPI: 58 y.o. with poorly controlled HTN and CHF presents for cardiology followup.  She has had HTN and diabetes for years.  She was admitted to [facility] in 2/16 with a hypertensive emergency and flash pulmonary edema. She had been off irbesartan for months.  She was initially given IV Lasix (just one dose) and started back on irbesartan.  Creatinine rose from 0.95 at admission to 2.71.  Irbesartan and Lasix were stopped.  Echo was done, showing EF 35-40% with basal inferior akinesis and mild to moderate MR.  Troponin was mildly elevated to peak 0.35. Lexiscan Cardiolite was done, showing EF 35% but no ischemia or infarction.  V/Q scan was normal.  In 6/16, she finally had cardiac cath showing severe RCA stenosis that was treated with DES.  Echo in 5/17 showed improvement in EF back to 55%, echo in 5/18 with EF 55-60%.   She returns for pharmacist-led HF medication titration. She was last seen by Dr. Shirlee Latch on 2/13, where her carvedilol 25 mg twice daily and amlodipine 10 mg daily were restarted (insurance ran out). She picked them up and started taking last Friday.  She is out of insulin and taking metformin in the meantime. She ran out of clonidine and furosemide (refill has been sent) this morning. She took all morning medications. She denies shortness of breath and works out in [employer] three times a week, is going to start Zumba. She denies DOE.  She is working on her diet and decreasing sugary drink intake. She reports she experienced a headache a few days after starting amlodipine and carvedilol, it has since subsided. Stable weights at home. She is working on paperwork for her insurance.    . Shortness of breath/dyspnea on exertion? no  . Orthopnea/PND? no . Edema? no . Lightheadedness/dizziness? no . Daily weights at home? Yes - 163 lb . Blood pressure/heart rate monitoring at home? Yes - she has not checked since she restarted carvedilol and  amlodipine . Following low-sodium/fluid-restricted diet? Yes- does not cook with salt  HF Medications: Carvedilol 25 mg twice daily Furosemide 40 mg daily Hydralazine 25 mg three times daily Spironolactone 50 mg daily (Amlodipine 10 mg daily) (Clonidine 0.1 mg twice daily)  Has the patient been experiencing any side effects to the medications prescribed?  no  Does the patient have any problems obtaining medications due to transportation or finances?   Yes - picks up medicines at [facility]  Understanding of regimen: good Understanding of indications: good Potential of compliance: fair Patient understands to avoid NSAIDs. Patient understands to avoid decongestants.    Pertinent Lab Values: . 08/23/18: Scr 0.99, K 3.8 . 03/09/18: Scr 0.98, K 3.6 . 01/23/17: Scr 1.15, K 4.0  Vital Signs: . Weight: 168 lb (dry weight: 163 lb) . Blood pressure: 145/85 mmHg . Heart rate: 66  Assessment: 1. Chronic systolic CHF (EF 38-18%, 11/2016), due to suspect mixed ischemic/NI (from HTN) cardiomyopathy. NYHA class I symptoms. - She is not volume overloaded on exam.    - Continue Coreg 25 mg bid - Increase hydralazine to 50 mg TID.  - Start isosorbide mononitrate 30 mg daily. - Continue spironolactone 50 mg daily. Scr stable. - She had marked AKI with ARB use in the past.   - Basic disease state pathophysiology, medication indication, mechanism and side effects reviewed at length with patient and he verbalized understanding  2. CAD: LHC 12/23/14 showed severe proximal RCA stenosis treated with Promus DES.  She was a Plavix non-responder so was transitioned to Brilinta 90 mg bid => now off Brilinta.  No chest pain.  - Continue ASA 81 and atorvastatin 40 dialy.  LDL 85 (08/2018) .   3. HTN: BP is high in clinic today  - Discontinue clonidine in favor titration of hydralazine and addition of isosorbide - Continue amlodipine 10 mg daily. Could consider decrease/discontinuation of amlodipine in future if  blood pressure becomes soft - She had marked AKI with ARB use in the past.   4. CKD: AKI in past after starting ARB, but fully recovered.  No evidence for renal artery stenosis on renal artery dopplers.  Suspect some baseline renal damage from long-standing HTN and diabetes but recent creatinine within normal range.     5. OSA: using CPAP.  6. Diabetes: She is taking metformin. -  Appt with endocrinology 4/3. - A1c 10.9   Plan: 1) Medication changes: Based on clinical presentation, vital signs and recent labs will discontinue clonidine. Increase hydralazine to 50 mg three times a day and start isosorbide mononitrate 30 mg daily. 2) Labs: as needed 3) Follow-up: Dr. Shirlee Latch 10/12/2018  Marcelino Freestone, PharmD PGY2 Cardiology Pharmacy Resident 09/17/2018 2:53 PM

## 2018-09-17 ENCOUNTER — Ambulatory Visit (HOSPITAL_COMMUNITY)
Admission: RE | Admit: 2018-09-17 | Discharge: 2018-09-17 | Disposition: A | Discharge status: Home / Self Care | Payer: Self-pay | Source: Ambulatory Visit | Attending: Cardiology | Admitting: Cardiology

## 2018-09-17 DIAGNOSIS — Z7901 Long term (current) use of anticoagulants: Secondary | ICD-10-CM | POA: Insufficient documentation

## 2018-09-17 DIAGNOSIS — E118 Type 2 diabetes mellitus with unspecified complications: Secondary | ICD-10-CM | POA: Insufficient documentation

## 2018-09-17 DIAGNOSIS — I2510 Atherosclerotic heart disease of native coronary artery without angina pectoris: Secondary | ICD-10-CM | POA: Insufficient documentation

## 2018-09-17 DIAGNOSIS — I130 Hypertensive heart and chronic kidney disease with heart failure and stage 1 through stage 4 chronic kidney disease, or unspecified chronic kidney disease: Secondary | ICD-10-CM | POA: Insufficient documentation

## 2018-09-17 DIAGNOSIS — Z79899 Other long term (current) drug therapy: Secondary | ICD-10-CM | POA: Insufficient documentation

## 2018-09-17 DIAGNOSIS — G4733 Obstructive sleep apnea (adult) (pediatric): Secondary | ICD-10-CM | POA: Insufficient documentation

## 2018-09-17 DIAGNOSIS — N189 Chronic kidney disease, unspecified: Secondary | ICD-10-CM | POA: Insufficient documentation

## 2018-09-17 DIAGNOSIS — I5022 Chronic systolic (congestive) heart failure: Secondary | ICD-10-CM | POA: Insufficient documentation

## 2018-09-17 DIAGNOSIS — Z7984 Long term (current) use of oral hypoglycemic drugs: Secondary | ICD-10-CM | POA: Insufficient documentation

## 2018-09-17 MED ORDER — ISOSORBIDE MONONITRATE ER 30 MG PO TB24: 30.0000 mg | ORAL_TABLET | Freq: Every day | ORAL | 11 refills | Status: DC

## 2018-09-17 MED ORDER — HYDRALAZINE HCL 50 MG PO TABS: 50.0000 mg | ORAL_TABLET | Freq: Three times a day (TID) | ORAL | 11 refills | Status: DC

## 2018-09-17 MED ORDER — SPIRONOLACTONE 50 MG PO TABS: 50.0000 mg | ORAL_TABLET | Freq: Every day | ORAL | 3 refills | Status: DC

## 2018-09-17 MED FILL — SPIRONOLACTONE 50 MG TABLET: 50 | 90 days supply | Qty: 90 | Fill #0

## 2018-09-17 MED FILL — hydrALAZINE HCL 50 MG TABS: 50 | 30 days supply | Qty: 90 | Fill #0

## 2018-09-17 MED FILL — ISOSORBIDE MN ER 30 MG TAB: 30 | 30 days supply | Qty: 30 | Fill #0

## 2018-09-17 NOTE — Patient Instructions (Addendum)
It was nice meeting you today in the Heart failure clinic at [facility]    Medication changes: 1. We are going to stop your clonidine (do not pick up prescription). 2. We are increasing your hydralazine to 50 mg three times daily 3. Start taking isosorbide mononitrate 30 mg daily.     Follow up with Dr Shirlee Latch in 10/12/2018.    Do the following things EVERYDAY: 1. Weigh yourself in the morning before breakfast. Write it down and keep it in a log. 2. Take your medicines as prescribed 3. Eat low salt foods-Limit salt (sodium) to 2000 mg per day.  4. Stay as active as you can everyday 5. Limit all fluids for the day to less than 2 liters 6. Start checking your blood pressure every day at least 2 hours after your medications. We would like you blood pressure less than 130/80 mmHg.

## 2018-09-27 ENCOUNTER — Ambulatory Visit: Payer: Self-pay | Admitting: Internal Medicine

## 2018-09-28 ENCOUNTER — Ambulatory Visit: Payer: Self-pay

## 2018-10-10 ENCOUNTER — Encounter (HOSPITAL_COMMUNITY): Payer: Self-pay

## 2018-10-10 ENCOUNTER — Other Ambulatory Visit: Payer: Self-pay

## 2018-10-10 ENCOUNTER — Ambulatory Visit (HOSPITAL_COMMUNITY)
Admission: RE | Admit: 2018-10-10 | Discharge: 2018-10-10 | Disposition: A | Discharge status: Home / Self Care | Payer: Self-pay | Source: Ambulatory Visit | Attending: Cardiology | Admitting: Cardiology

## 2018-10-10 DIAGNOSIS — I5022 Chronic systolic (congestive) heart failure: Secondary | ICD-10-CM

## 2018-10-10 DIAGNOSIS — I10 Essential (primary) hypertension: Secondary | ICD-10-CM

## 2018-10-10 DIAGNOSIS — I2510 Atherosclerotic heart disease of native coronary artery without angina pectoris: Secondary | ICD-10-CM

## 2018-10-10 MED ORDER — ATORVASTATIN CALCIUM 80 MG PO TABS: 80.0000 mg | ORAL_TABLET | Freq: Every day | ORAL | 3 refills | Status: DC

## 2018-10-10 NOTE — Progress Notes (Signed)
Heart Failure TeleHealth Note  Due to national recommendations of social distancing due to COVID 19, Audio telehealth visit is felt to be most appropriate for this patient at this time.  See MyChart message from today for patient consent regarding telehealth for [facility].  Date:  10/10/2018   ID:  Makenzie, [place] May 17, 1961, MRN [id number]  Location: Home  Provider location: [address] [place] Type of Visit: Established patient  PCP:  Marcine Matar, MD  Cardiologist:  No primary care provider on file. Primary HF: Dr. Shirlee Latch  Chief Complaint: Exertional dyspnea   History of Present Illness: Fatumata Koltun Meincke is a 58 y.o. female who presents via audio/video conferencing for a telehealth visit today.     Today,  she denies symptoms of cough, fevers, chills, or new SOB worrisome for COVID 19.    Patient has poorly controlled HTN and CHF.  She has had HTN and diabetes for years.  She was admitted to [facility] in 2/16 with a hypertensive emergency and flash pulmonary edema. She had been off irbesartan for months.  She was initially given IV Lasix (just one dose) and started back on irbesartan.  Creatinine rose from 0.95 at admission to 2.71.  Irbesartan and Lasix were stopped.  Echo was done, showing EF 35-40% with basal inferior akinesis and mild to moderate MR.  Troponin was mildly elevated to peak 0.35.  Due to elevated creatinine, she did not have cardiac cath initially.  Lexiscan Cardiolite was done, showing EF 35% but no ischemia or infarction.  V/Q scan was normal.  BP was controlled and she was sent home. In 6/16, she finally had cardiac cath showing severe RCA stenosis that was treated with DES.  Echo in 5/17 showed improvement in EF back to 55%, echo in 5/18 with EF 55-60%.   At last visit, she was not taking her cardiac medications.  Today, patient is on all of her meds.  No chest pain.  No exertional dyspnea.  No lightheadedness.  BP is under better  control. She walks a track for exercise for 1 mile at a time. She is now following with endocrinology for her diabetes.   Labs (2/16): K 4.3, creatinine 0.95 => 2.71 => 1.78, TnI 0.35, Hgb 10, LDL 149, HCT 31, TSH normal, plasma aldosterone 1, urine catecholeamines normal.  Labs (3/16): K 4, creatinine 1.11 Labs (4/16): K 3.9, creatinine 1.09 Labs (12/24/14): K 4.1, creatinine 1.08, P2Y12 261 Labs 02/23/2015: K 3.6 Creatinine 0.90, HCT 35.4, BNP 18 Labs (10/16): K 4.1, creatinine 1.03, BNP 14 Labs (2/18): Creatinine 1.09, K 4.1 Labs (7/18): K 4, creatinine 1.15 Labs (8/19): LDL 89, K 3.6, creatinine 4.62 Labs (2/20): LDL 85, HDL 48, K 3.8, creatinine 7.03  PMH: 1. HTN: For > 18 yrs, poor control historically. Renal artery dopplers showed no renal artery stenosis (2/16).   2. Type II diabetes. 3. 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  Admitted 2/16 with hypertensive emergency and pulmonary edema.  Echo (2/16) with EF 35-40%, basal inferior akinesis, mild-moderate MR.  Lexiscan Cardiolite (2/16) with EF 35%, no ischemia or infarction but LHC showed severe RCA disease, as below.    - Echo (5/17): EF 55%, mild LVH.  - Echo (5/18): EF 55-60%, mild MR 4. Hyperlipidemia 5. AKI/CKD: AKI in 2/16 in the setting of ARB and diuresis.  6. CAD: LHC (6/16) with 90% proximal RCA, 50% mRCA, 60% dRCA.  Promus DES to proximal  RCA.  7. OSA: Moderate, using CPAP. 8. Inadequate platelet inhibition with Plavix.  9. Palpitations: Holter in 4/16 showed primarily NSR, rare PVCs, with 10 beat run of AIVR.  Current Outpatient Medications  Medication Sig Dispense Refill  . amLODipine (NORVASC) 10 MG tablet Take 1 tablet (10 mg total) by mouth daily. 90 tablet 0  . aspirin EC 81 MG EC tablet Take 1 tablet (81 mg total) by mouth daily.    Marland Kitchen atorvastatin (LIPITOR) 80 MG tablet Take 1 tablet (80 mg total) by mouth daily at 6 PM. 90 tablet 3  . carvedilol (COREG) 25 MG tablet  Take 1 tablet (25 mg total) by mouth 2 (two) times daily with a meal. 180 tablet 0  . furosemide (LASIX) 40 MG tablet TAKE 1 TABLET BY MOUTH DAILY. 30 tablet 3  . hydrALAZINE (APRESOLINE) 50 MG tablet Take 1 tablet (50 mg total) by mouth 3 (three) times daily. 90 tablet 11  . insulin aspart (NOVOLOG FLEXPEN) 100 UNIT/ML FlexPen Inject 15 Units into the skin 3 (three) times daily with meals. (Patient not taking: Reported on 08/23/2018) 15 mL 11  . Insulin Glargine (BASAGLAR KWIKPEN) 100 UNIT/ML SOPN Inject 0.46 mLs (46 Units total) into the skin at bedtime. (Patient not taking: Reported on 08/23/2018) 15 mL 11  . isosorbide mononitrate (IMDUR) 30 MG 24 hr tablet Take 1 tablet (30 mg total) by mouth daily. 30 tablet 11  . metFORMIN (GLUCOPHAGE) 1000 MG tablet 1 tab PO Q a.m and 1/2 tab Q p.m 45 tablet 6  . ONE TOUCH ULTRA TEST test strip USE TO TEST BLOOD SUGAR TWICE DAILY  3  . spironolactone (ALDACTONE) 50 MG tablet Take 1 tablet (50 mg total) by mouth daily. 90 tablet 3   Current Facility-Administered Medications  Medication Dose Route Frequency Provider Last Rate Last Dose  . insulin aspart (novoLOG) injection 6 Units  6 Units Subcutaneous Once Marcine Matar, MD        Allergies:   Patient has no known allergies.   Social History:  The patient  reports that she quit smoking about 19 years ago. Her smoking use included cigarettes. She has a 5.00 pack-year smoking history. She has never used smokeless tobacco. She reports current alcohol use. She reports that she does not use drugs.   Family History:  The patient's family history includes Cancer in her mother.   ROS:  Please see the history of present illness.   All other systems are personally reviewed and negative.   Exam:  [facility] Call; Exam is subjective and or/visual.) BP 130/89 (obtained by patient) General:  No resp difficulty. Lungs: Normal respiratory effort with conversation.  Abdomen: Non-distended. Pt denies tenderness  with self palpation.  Extremities: Pt denies edema. Neuro: Alert & oriented x 3.   Recent Labs: 08/23/2018: BUN 13; Creatinine, Ser 0.99; Hemoglobin 12.6; Platelets 259; Potassium 3.8; Sodium 137  Personally reviewed   Wt Readings from Last 3 Encounters:  09/17/18 76.2 kg (168 lb)  08/23/18 75.8 kg (167 lb)  03/09/18 78.6 kg (173 lb 4 oz)     ASSESSMENT AND PLAN:  1. Chronic systolic CHF: EF 24-58% by echo 08/17/14.  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  HTN has been treated and she is s/p DES to RCA, and EF on 5/17 echo was improved back to 55%.  Echo in 5/18 was similar with EF 55-60%. She is not volume overloaded on exam.    - Continue Coreg 25 mg bid.   -  Continue spironolactone 50 mg daily. Recent BMET was stable.  - Continue hydralazine/Imdur.  2. CAD: LHC 12/23/14 showed severe proximal RCA stenosis treated with Promus DES.  She was a Plavix non-responder so was transitioned to Brilinta 90 mg bid => now off Brilinta.  No chest pain.  - Continue ASA 81.   - Atorvastatin increased to 80 mg daily after LDL > 70 on recent labs.  Repeat lipids/LFTs in 2 months.  3. HTN: She is back on her BP meds, BP is better controlled.  - Continue hydralazine 50 tid, Coreg 25 bid, amlodipine 10 daily, spironolactone 25 daily.  - She had marked AKI with ARB use in the past.  4. CKD: AKI in past after starting ARB, but fully recovered.  No evidence for renal artery stenosis on renal artery dopplers.  Suspect some baseline renal damage from long-standing HTN and diabetes but recent creatinine within normal range.    5. OSA: using CPAP. 6. Diabetes: She has followup with endocrinology.    COVID screen The patient does not have any symptoms that suggest any further testing/ screening at this time.  Social distancing reinforced today.  Recommended follow-up:  4 months  Relevant cardiac medications were reviewed at length with the patient today.   The patient does not have  concerns regarding their medications at this time.   Patient Risk: After full review of this patients clinical status, I feel that they are at moderate risk for cardiac decompensation at this time.  Today, I have spent 18 minutes with the patient with telehealth technology discussing CAD, CHF, HTN.    Signed, [name], MD  10/10/2018  [facility] [address] [facility] [place] [address] [phone] (office) [phone] (fax)

## 2018-10-11 ENCOUNTER — Other Ambulatory Visit: Payer: Self-pay

## 2018-10-12 ENCOUNTER — Encounter: Payer: Self-pay | Admitting: Internal Medicine

## 2018-10-12 ENCOUNTER — Ambulatory Visit: Payer: Self-pay | Admitting: Internal Medicine

## 2018-10-12 ENCOUNTER — Encounter (HOSPITAL_COMMUNITY): Payer: Self-pay | Admitting: Cardiology

## 2018-10-12 ENCOUNTER — Other Ambulatory Visit: Payer: Self-pay

## 2018-10-12 VITALS — BP 160/100 | HR 85 | Temp 98.3°F | Wt 161.2 lb

## 2018-10-12 DIAGNOSIS — E1165 Type 2 diabetes mellitus with hyperglycemia: Secondary | ICD-10-CM | POA: Insufficient documentation

## 2018-10-12 DIAGNOSIS — E1159 Type 2 diabetes mellitus with other circulatory complications: Secondary | ICD-10-CM

## 2018-10-12 MED ORDER — INSULIN NPH (HUMAN) (ISOPHANE) 100 UNIT/ML ~~LOC~~ SUSP: SUBCUTANEOUS | 11 refills | Status: DC

## 2018-10-12 MED ORDER — METFORMIN HCL 1000 MG PO TABS: 1000.0000 mg | ORAL_TABLET | Freq: Two times a day (BID) | ORAL | 3 refills | Status: DC

## 2018-10-12 MED FILL — ATORVASTATIN 80 MG TABLET: 80 | 90 days supply | Qty: 90 | Fill #0

## 2018-10-12 NOTE — Patient Instructions (Addendum)
Try to get the ReliOn glucometer. Check sugars 2x a day.  Please start: - Metformin 500 mg 2x a day with meals for 4 days, then increase to 1000 mg 2x a day - NPH 15 units in am and 10 units at bedtime  Please let me know if the sugars are consistently <80 or >200.  Please come back for a follow-up appointment in 2 months.   PATIENT INSTRUCTIONS FOR TYPE 2 DIABETES:  **Please join MyChart!** - see attached instructions about how to join if you have not done so already.  DIET AND EXERCISE Diet and exercise is an important part of diabetic treatment.  We recommended aerobic exercise in the form of brisk walking (working between 40-60% of maximal aerobic capacity, similar to brisk walking) for 150 minutes per week (such as 30 minutes five days per week) along with 3 times per week performing 'resistance' training (using various gauge rubber tubes with handles) 5-10 exercises involving the major muscle groups (upper body, lower body and core) performing 10-15 repetitions (or near fatigue) each exercise. Start at half the above goal but build slowly to reach the above goals. If limited by weight, joint pain, or disability, we recommend daily walking in a swimming pool with water up to waist to reduce pressure from joints while allow for adequate exercise.    BLOOD GLUCOSES Monitoring your blood glucoses is important for continued management of your diabetes. Please check your blood glucoses 2-4 times a day: fasting, before meals and at bedtime (you can rotate these measurements - e.g. one day check before the 3 meals, the next day check before 2 of the meals and before bedtime, etc.).   HYPOGLYCEMIA (low blood sugar) Hypoglycemia is usually a reaction to not eating, exercising, or taking too much insulin/ other diabetes drugs.  Symptoms include tremors, sweating, hunger, confusion, headache, etc. Treat IMMEDIATELY with 15 grams of Carbs: . 4 glucose tablets .  cup regular juice/soda . 2  tablespoons raisins . 4 teaspoons sugar . 1 tablespoon honey Recheck blood glucose in 15 mins and repeat above if still symptomatic/blood glucose <100.  RECOMMENDATIONS TO REDUCE YOUR RISK OF DIABETIC COMPLICATIONS: * Take your prescribed MEDICATION(S) * Follow a DIABETIC diet: Complex carbs, fiber rich foods, (monounsaturated and polyunsaturated) fats * AVOID saturated/trans fats, high fat foods, >2,300 mg salt per day. * EXERCISE at least 5 times a week for 30 minutes or preferably daily.  * DO NOT SMOKE OR DRINK more than 1 drink a day. * Check your FEET every day. Do not wear tightfitting shoes. Contact us if you develop an ulcer * See your EYE doctor once a year or more if needed * Get a FLU shot once a year * Get a PNEUMONIA vaccine once before and once after age 3 years  GOALS:  * Your Hemoglobin A1c of <7%  * fasting sugars need to be <130 * after meals sugars need to be <180 (2h after you start eating) * Your Systolic BP should be 140 or lower  * Your Diastolic BP should be 80 or lower  * Your HDL (Good Cholesterol) should be 40 or higher  * Your LDL (Bad Cholesterol) should be 100 or lower. * Your Triglycerides should be 150 or lower  * Your Urine microalbumin (kidney function) should be <30 * Your Body Mass Index should be 25 or lower    Please consider the following ways to cut down carbs and fat and increase fiber and micronutrients in your diet: -  substitute whole grain for white bread or pasta - substitute brown rice for white rice - substitute 90-calorie flat bread pieces for slices of bread when possible - substitute sweet potatoes or yams for white potatoes - substitute humus for margarine - substitute tofu for cheese when possible - substitute almond or rice milk for regular milk (would not drink soy milk daily due to concern for soy estrogen influence on breast cancer risk) - substitute dark chocolate for other sweets when possible - substitute water - can  add lemon or orange slices for taste - for diet sodas (artificial sweeteners will trick your body that you can eat sweets without getting calories and will lead you to overeating and weight gain in the long run) - do not skip breakfast or other meals (this will slow down the metabolism and will result in more weight gain over time)  - can try smoothies made from fruit and almond/rice milk in am instead of regular breakfast - can also try old-fashioned (not instant) oatmeal made with almond/rice milk in am - order the dressing on the side when eating salad at a restaurant (pour less than half of the dressing on the salad) - eat as little meat as possible - can try juicing, but should not forget that juicing will get rid of the fiber, so would alternate with eating raw veg./fruits or drinking smoothies - use as little oil as possible, even when using olive oil - can dress a salad with a mix of balsamic vinegar and lemon juice, for e.g. - use agave nectar, stevia sugar, or regular sugar rather than artificial sweateners - steam or broil/roast veggies  - snack on veggies/fruit/nuts (unsalted, preferably) when possible, rather than processed foods - reduce or eliminate aspartame in diet (it is in diet sodas, chewing gum, etc) Read the labels!  Try to read Dr. Katherina Right book: "Program for Reversing Diabetes" for other ideas for healthy eating.

## 2018-10-12 NOTE — Progress Notes (Signed)
Patient ID: Misty Miller, female   DOB: 1961/06/09, 58 y.o.   MRN: [id number]  HPI: Misty Miller is a 58 y.o.-year-old female, referred by her cardiologist, Dr. Shirlee Latch, for management of DM2, dx in 2012, prev. Prediabetes in 1994 (GDM), insulin-dependent, uncontrolled, with complications (CAD-s/p RCA stent, AMI 2016; CHF, stage III CKD).  Reviewed latest HbA1c level: Lab Results  Component Value Date   HGBA1C 10.9 (H) 08/23/2018   HGBA1C 15 12/20/2016   HGBA1C 8.5 (H) 08/16/2014   HGBA1C 8.8 (H) 08/16/2014   Pt was on: - Metformin 1000 mg in a.m. and 500 mg in p.m.  However, this was changed to insulin: - Lantus 46 units at bedtime - Novolog 15 units 3x a day, before meals  But she was laid off work >> could not afford insulins for the last year.  She is not on medicines now. She does not have insurance.  Pt does not check sugars - no meter: - am: n/c - 2h after b'fast: n/c - before lunch: n/c - 2h after lunch: n/c - before dinner: n/c - 2h after dinner: n/c - bedtime: n/c - nighttime: n/c Lowest sugar was 160 ; ? hypoglycemia awareness.  Highest sugar was 618 - 2018.  Glucometer: One Touch ultra  Pt's meals are: - Breakfast: eggs, grits, bacon, toat, bagel or oatmeal - Lunch: skips - Dinner: salad, chicken, fish, [place] + veggies - Snacks: 1 pack PB crackers  -after dinner  - + CKD stage III, last BUN/creatinine:  Lab Results  Component Value Date   BUN 13 08/23/2018   BUN 16 03/09/2018   CREATININE 0.99 08/23/2018   CREATININE 0.98 03/09/2018   -+ HL; last set of lipids: Lab Results  Component Value Date   CHOL 165 08/23/2018   HDL 48 08/23/2018   LDLCALC 85 08/23/2018   TRIG 160 (H) 08/23/2018   CHOLHDL 3.4 08/23/2018  She is on Lipitor, dose recently increased to 80 mg daily.  - last eye exam was in 2019. No DR.   - no numbness and tingling in her feet.  Pt has FH of DM in Mother.  She also has a history of HTN, OSA-on CPAP, obesity.  Her  blood pressure is high today as she did not eat and did not take her medicines yet.  ROS: Constitutional: no weight gain, + weight loss, no fatigue, no subjective hyperthermia, + subjective hypothermia, no nocturia, + decreased appetite Eyes: no blurry vision, no xerophthalmia ENT: no sore throat, no nodules palpated in neck, no dysphagia, no odynophagia, no hoarseness, no tinnitus, no hypoacusis Cardiovascular: no CP, + SOB, no palpitations, + leg swelling Respiratory: no cough, + SOB, no wheezing Gastrointestinal: no N, no V, no D, + C, no acid reflux Musculoskeletal: no muscle, no joint aches Skin: no rash, no hair loss Neurological: no tremors, no numbness or tingling/no dizziness/no HAs Psychiatric: no depression, no anxiety + low libido  Past Medical History:  Diagnosis Date  . CHF (congestive heart failure) (HCC) 08/16/2014  . Chronic kidney disease 08/16/2014  . Coronary artery disease    cath 12/23/2014 single vessel dx, DES to RCA, moderate mid RCA residual  . Family history of adverse reaction to anesthesia    "it's hard to wake my mom after procedure is over"  . History of blood transfusion 1992   S/P miscarriage  . Hyperlipemia   . Hypertension   . Myocardial infarction (HCC) 08/16/2014   "mild"  . Obesity (BMI 30-39.9) 04/08/2015  .  OSA (obstructive sleep apnea)    moderate with an AHI of   . Sleep apnea    "recently tested positive; no mask yet" (12/23/2014)  . Type II diabetes mellitus (HCC)    Past Surgical History:  Procedure Laterality Date  . CARDIAC CATHETERIZATION N/A 12/23/2014   Procedure: Left Heart Cath and Coronary Angiography;  Surgeon: Laurey Morale, MD;  [facility] INVASIVE CV LAB;  Service: Cardiovascular;  Laterality: N/A;  . CARDIAC CATHETERIZATION N/A 12/23/2014   Procedure: Intravascular Pressure Wire/FFR Study;  Surgeon: Lyn Records, MD;  Location: [facility] INVASIVE CV LAB;  Service: Cardiovascular;  Laterality: N/A;  . CARDIAC CATHETERIZATION N/A  12/23/2014   Procedure: Coronary Stent Intervention;  Surgeon: Lyn Records, MD;  Location: [facility] INVASIVE CV LAB;  Service: Cardiovascular;  Laterality: N/A;  . CESAREAN SECTION  06/1995  . CORONARY ANGIOPLASTY WITH STENT PLACEMENT  12/23/2014   "1"  . 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 Marital status: Married    Spouse name: Not on file  . Number of children: 1  . Years of education: Not on file  . Highest education level: Not on file  Occupational History  . Job counselor  Social Needs  . Financial resource strain: Not on file  . Food insecurity:    Worry: Not on file    Inability: Not on file  . Transportation needs:    Medical: Not on file    Non-medical: Not on file  Tobacco Use  . Smoking status: Former Smoker    Packs/day: 0.25    Years: 20.00    Pack years: 5.00    Types: Cigarettes    Last attempt to quit: 11/09/1998    Years since quitting: 19.9  . Smokeless tobacco: Never Used  Substance and Sexual Activity  . Alcohol use: Yes    Comment: beer  - 3 a mo  . Drug use: No   Current Outpatient Medications on File Prior to Visit  Medication Sig Dispense Refill  . amLODipine (NORVASC) 10 MG tablet Take 1 tablet (10 mg total) by mouth daily. 90 tablet 0  . aspirin EC 81 MG EC tablet Take 1 tablet (81 mg total) by mouth daily.    Marland Kitchen atorvastatin (LIPITOR) 80 MG tablet Take 1 tablet (80 mg total) by mouth daily at 6 PM. 90 tablet 3  . carvedilol (COREG) 25 MG tablet Take 1 tablet (25 mg total) by mouth 2 (two) times daily with a meal. 180 tablet 0  . furosemide (LASIX) 40 MG tablet TAKE 1 TABLET BY MOUTH DAILY. 30 tablet 3  . hydrALAZINE (APRESOLINE) 50 MG tablet Take 1 tablet (50 mg total) by mouth 3 (three) times daily. 90 tablet 11  . isosorbide mononitrate (IMDUR) 30 MG 24 hr tablet Take 1 tablet (30 mg total) by mouth daily. 30 tablet 11  . metFORMIN (GLUCOPHAGE) 1000 MG tablet 1 tab PO Q a.m and 1/2 tab Q p.m  45 tablet 6  . ONE TOUCH ULTRA TEST test strip USE TO TEST BLOOD SUGAR TWICE DAILY  3  . spironolactone (ALDACTONE) 50 MG tablet Take 1 tablet (50 mg total) by mouth daily. 90 tablet 3  . insulin aspart (NOVOLOG FLEXPEN) 100 UNIT/ML FlexPen Inject 15 Units into the skin 3 (three) times daily with meals. (Patient not taking: Reported on 08/23/2018) 15 mL 11  . Insulin Glargine (BASAGLAR KWIKPEN) 100 UNIT/ML SOPN Inject 0.46 mLs (  46 Units total) into the skin at bedtime. (Patient not taking: Reported on 08/23/2018) 15 mL 11   Current Facility-Administered Medications on File Prior to Visit  Medication Dose Route Frequency Provider Last Rate Last Dose  . insulin aspart (novoLOG) injection 6 Units  6 Units Subcutaneous Once Marcine Matar, MD       No Known Allergies Family History  Problem Relation Age of Onset  . Cancer Mother     PE: BP (!) 160/100 (BP Location: Right Arm, Patient Position: Sitting, Cuff Size: Normal)   Pulse 85   Temp 98.3 F (36.8 C) (Oral)   Wt 161 lb 3.2 oz (73.1 kg)   LMP  (LMP Unknown)   SpO2 98%   BMI 29.48 kg/m  Wt Readings from Last 3 Encounters:  10/12/18 161 lb 3.2 oz (73.1 kg)  09/17/18 168 lb (76.2 kg)  08/23/18 167 lb (75.8 kg)   Constitutional: overweight, in NAD Eyes: PERRLA, EOMI, no exophthalmos ENT: moist mucous membranes, no thyromegaly, no cervical lymphadenopathy Cardiovascular: RRR, No MRG Respiratory: CTA B Gastrointestinal: abdomen soft, NT, ND, BS+ Musculoskeletal: no deformities, strength intact in all 4 Skin: moist, warm, no rashes Neurological: no tremor with outstretched hands, DTR normal in all 4  ASSESSMENT: 1. DM2, insulin-dependent, uncontrolled, with long-term complications - CAD, s/p RCA stent; h/o AMI 2016 - systolic CHF - CKD stage III  PLAN:  1. Patient with long-standing, uncontrolled diabetes, previously on oral antidiabetic regimen, then basal-bolus insulin regimen, which she stopped 1 year ago as she could  not afford it. She is now not checking sugars - per review of the chart, she had sugars ar high at 600 in 2018, more recently 300s. - she is concerned about losing weight and possible DM complications - she already had an AMI, now has CHF - We reviewed together latest HbA1c which was very high, at 10.9% - at this point, we discussed about the fact that she is most likely glucotoxic, and I explained that we need long-acting insulin at least to bring her sugars down.  Since she does not have insurance will need to use the ReliOn insulin from [place].  I advised her to start NPH vials, which is the cheapest insulin available for now.  She agrees to do so.  We will also add metformin and titrate to target dose of 1000 mg twice a day.  She used this in the past and she tolerated this well.  I also advised her to get a ReliOn glucometer from [place] and start checking sugars twice a day. -We discussed that I can also refer her to nutrition, but for now, she would like to hold off.  I also gave her written instructions about improving her diet. -At next visit, plan to check her for type 1 diabetes, but for now, her sugars are too high to check her insulin production. - I suggested to:  Patient Instructions   Try to get the ReliOn glucometer. Check sugars 2x a day.  Please start: - Metformin 500 mg 2x a day with meals for 4 days, then increase to 1000 mg 2x a day - NPH 15 units in am and 10 units at bedtime  Please let me know if the sugars are consistently <80 or >200.  Please come back for a follow-up appointment in 2 months.  - Strongly advised her to start checking sugars at different times of the day - check 2x a day, rotating checks - discussed about CBG targets for  treatment: 80-130 mg/dL before meals and <099 mg/dL after meals; target [id number] <7%. - given sugar log and advised how to fill it and to bring it at next appt  - given foot care handout and explained the principles  - given  instructions for hypoglycemia management "15-15 rule"  - advised for yearly eye exams  - Return to clinic in 2 mo with sugar log   Carlus Pavlov, MD PhD [facility]

## 2018-10-13 MED FILL — metFORMIN HCL 1000 MG TABS: 1000 | 30 days supply | Qty: 60 | Fill #0

## 2018-10-26 ENCOUNTER — Other Ambulatory Visit (HOSPITAL_COMMUNITY): Payer: Self-pay

## 2018-11-02 MED FILL — FUROSEMIDE 40 MG TAB: 40 | 30 days supply | Qty: 30 | Fill #1

## 2018-11-02 MED FILL — CARVEDILOL 25 MG TABLET: 25 | 30 days supply | Qty: 60 | Fill #1

## 2018-11-02 MED FILL — ISOSORBIDE MN ER 30 MG TAB: 30 | 30 days supply | Qty: 30 | Fill #1

## 2018-11-15 ENCOUNTER — Encounter: Payer: Self-pay | Admitting: Internal Medicine

## 2018-11-15 ENCOUNTER — Other Ambulatory Visit: Payer: Self-pay

## 2018-11-26 ENCOUNTER — Other Ambulatory Visit (HOSPITAL_COMMUNITY): Payer: Self-pay

## 2018-12-07 ENCOUNTER — Other Ambulatory Visit: Payer: Self-pay

## 2018-12-07 ENCOUNTER — Ambulatory Visit: Payer: Self-pay | Attending: Internal Medicine | Admitting: Internal Medicine

## 2018-12-07 ENCOUNTER — Encounter: Payer: Self-pay | Admitting: Internal Medicine

## 2018-12-07 DIAGNOSIS — I10 Essential (primary) hypertension: Secondary | ICD-10-CM

## 2018-12-07 DIAGNOSIS — Z9989 Dependence on other enabling machines and devices: Secondary | ICD-10-CM

## 2018-12-07 DIAGNOSIS — I5022 Chronic systolic (congestive) heart failure: Secondary | ICD-10-CM

## 2018-12-07 DIAGNOSIS — E1165 Type 2 diabetes mellitus with hyperglycemia: Secondary | ICD-10-CM

## 2018-12-07 DIAGNOSIS — Z794 Long term (current) use of insulin: Secondary | ICD-10-CM

## 2018-12-07 DIAGNOSIS — G4733 Obstructive sleep apnea (adult) (pediatric): Secondary | ICD-10-CM

## 2018-12-07 DIAGNOSIS — E118 Type 2 diabetes mellitus with unspecified complications: Secondary | ICD-10-CM

## 2018-12-07 DIAGNOSIS — IMO0002 Reserved for concepts with insufficient information to code with codable children: Secondary | ICD-10-CM

## 2018-12-07 DIAGNOSIS — I2510 Atherosclerotic heart disease of native coronary artery without angina pectoris: Secondary | ICD-10-CM

## 2018-12-07 MED ORDER — FUROSEMIDE 40 MG PO TABS: 40.0000 mg | ORAL_TABLET | Freq: Every day | ORAL | 3 refills | Status: DC

## 2018-12-07 NOTE — Progress Notes (Signed)
Pt states her blood sugar this morning was 173  Pt states she was taken off Norvasc by her cardiologist back in February or March

## 2018-12-07 NOTE — Progress Notes (Signed)
Virtual Visit via Telephone Note Due to current restrictions/limitations of in-office visits due to the COVID-19 pandemic, this scheduled clinical appointment was converted to a telehealth visit  I connected with Misty Miller on 12/07/18 at 11:21 a.m EDT by telephone and verified that I am speaking with the correct person using two identifiers. I am in my office.  The patient is at home.  Only the patient and myself participated in this encounter.  I discussed the limitations, risks, security and privacy concerns of performing an evaluation and management service by telephone and the availability of in person appointments. I also discussed with the patient that there may be a patient responsible charge related to this service. The patient expressed understanding and agreed to proceed.   History of Present Illness: Patient with history of HTN, diabetes type 2, chronic systolic CHF with EF 55-60% as of 11/2016, CAD with stent to RCA, obstructive sleep apnea on CPAP and obesity.  Last seen 01/2017.  Did not f/u regularly due to lack of insurance and no [facility].  She now has forms completed and ready to turn in to be considered for [facility]  sCHF/HTN/CAD:  Currently taking: see medication list Med Adherence: [x]  Yes - not taking Norvasc.  States cardiology took her off it     Medication side effects: []  Yes    [x]  No Adherence with salt restriction: [x]  Yes    []  No Home Monitoring?: [x]  Yes    []  No Monitoring Frequency: every morning.  This a.m reading was 109/65, P 73 Home BP results range: []  Yes    []  No SOB? []  Yes    [x]  No Chest Pain?: []  Yes    [x]  No Leg swelling?: []  Yes    [x]  No Headaches?: []  Yes    [x]  No Dizziness? []  Yes    [x]  No Comments:    DM:   Lab Results  Component Value Date   HGBA1C 10.9 (H) 08/23/2018  BS this a.m was 173. Checking BS QID before meals and bedtime.  A.m range 130-150, before lunch 98-163, before dinner 150s Meds:  Was on Lantus and  Novolog when I last saw her in 2018.  Loss job and was out of med for about 1 yr.  Then saw endo Dr. Elvera Lennox last mth.  Placed on  NPH 15/10 instead  and Metformin.  Has f/u with endocrine, Dr. Elvera Lennox, next mth Exercise:  Walks one mile 3 days a wk around a nearby track Eating habits:  Feels she is doing good.  Avoids sugary drinks.  Weakness is potatoe chips; eats small bag every night Last eye exam:  Last eye exam 1 yr ago at [facility]  No retinopathy.   No numbness in feet  OSA:  Compliant with CPAP  HM:  Over due for PAP, MMG and vaccines.  Reports having had c-scope 2 yrs ago with Melanie Crazier.     Outpatient Encounter Medications as of 12/07/2018  Medication Sig Note  . aspirin EC 81 MG EC tablet Take 1 tablet (81 mg total) by mouth daily.   Marland Kitchen atorvastatin (LIPITOR) 80 MG tablet Take 1 tablet (80 mg total) by mouth daily at 6 PM.   . carvedilol (COREG) 25 MG tablet Take 1 tablet (25 mg total) by mouth 2 (two) times daily with a meal.   . furosemide (LASIX) 40 MG tablet TAKE 1 TABLET BY MOUTH DAILY.   . hydrALAZINE (APRESOLINE) 50 MG tablet Take 1 tablet (50 mg total)  by mouth 3 (three) times daily.   . insulin NPH Human (NOVOLIN N RELION) 100 UNIT/ML injection Inject 15 units in am and 10 units at bedtime under skin - Relion brand   . isosorbide mononitrate (IMDUR) 30 MG 24 hr tablet Take 1 tablet (30 mg total) by mouth daily.   . metFORMIN (GLUCOPHAGE) 1000 MG tablet Take 1 tablet (1,000 mg total) by mouth 2 (two) times daily with a meal.   . spironolactone (ALDACTONE) 50 MG tablet Take 1 tablet (50 mg total) by mouth daily.   Marland Kitchen. amLODipine (NORVASC) 10 MG tablet Take 1 tablet (10 mg total) by mouth daily. (Patient not taking: Reported on 12/07/2018)   . insulin aspart (NOVOLOG FLEXPEN) 100 UNIT/ML FlexPen Inject 15 Units into the skin 3 (three) times daily with meals. (Patient not taking: Reported on 08/23/2018)   . Insulin Glargine (BASAGLAR KWIKPEN) 100 UNIT/ML SOPN Inject 0.46 mLs (46  Units total) into the skin at bedtime. (Patient not taking: Reported on 08/23/2018)   . ONE TOUCH ULTRA TEST test strip USE TO TEST BLOOD SUGAR TWICE DAILY 04/09/2015: Received from: External Pharmacy   Facility-Administered Encounter Medications as of 12/07/2018  Medication  . insulin aspart (novoLOG) injection 6 Units   Social History   Socioeconomic History  . Marital status: Married    Spouse name: Not on file  . Number of children: Not on file  . Years of education: Not on file  . Highest education level: Not on file  Occupational History  . Not on file  Social Needs  . Financial resource strain: Not on file  . Food insecurity:    Worry: Not on file    Inability: Not on file  . Transportation needs:    Medical: Not on file    Non-medical: Not on file  Tobacco Use  . Smoking status: Former Smoker    Packs/day: 0.25    Years: 20.00    Pack years: 5.00    Types: Cigarettes    Last attempt to quit: 11/09/1998    Years since quitting: 20.0  . Smokeless tobacco: Never Used  Substance and Sexual Activity  . Alcohol use: Yes    Comment: 12/23/2014 "might have a couple mixed drinks once/month"  . Drug use: No  . Sexual activity: Yes  Lifestyle  . Physical activity:    Days per week: Not on file    Minutes per session: Not on file  . Stress: Not on file  Relationships  . Social connections:    Talks on phone: Not on file    Gets together: Not on file    Attends religious service: Not on file    Active member of club or organization: Not on file    Attends meetings of clubs or organizations: Not on file    Relationship status: Not on file  . Intimate partner violence:    Fear of current or ex partner: Not on file    Emotionally abused: Not on file    Physically abused: Not on file    Forced sexual activity: Not on file  Other Topics Concern  . Not on file  Social History Narrative  . Not on file      Observations/Objective: BP:  109/65 BS 173 Lab Results   Component Value Date   WBC 7.5 08/23/2018   HGB 12.6 08/23/2018   HCT 37.5 08/23/2018   MCV 84.8 08/23/2018   PLT 259 08/23/2018     Chemistry  Component Value Date/Time   NA 137 08/23/2018 0921   NA 137 01/23/2017 1416   K 3.8 08/23/2018 0921   CL 98 08/23/2018 0921   CO2 25 08/23/2018 0921   BUN 13 08/23/2018 0921   BUN 20 01/23/2017 1416   CREATININE 0.99 08/23/2018 0921      Component Value Date/Time   CALCIUM 9.5 08/23/2018 0921   ALKPHOS 114 12/20/2016 1509   AST 13 12/20/2016 1509   ALT 10 12/20/2016 1509   BILITOT 0.7 12/20/2016 1509       Assessment and Plan:  1. Uncontrolled type 2 diabetes mellitus with complication, with long-term current use of insulin (HCC) Reported BS are closer to goal since being back on insulin Dietary counseling given.  Advise to eat a fruit for snack at night instead of potatoe chips Continue regular exercise  2. Essential hypertension -reported home BP readings at goal Continue current meds and DASH  3. OSA on CPAP Continue compliance with CPAP  4. Chronic systolic CHF (congestive heart failure) (HCC) -hx suggest that she is compensated.  Followed by Dr. Shirlee Latch  5. Coronary artery disease involving native coronary artery of native heart without angina pectoris -stable.  Followed by cardiology  Follow Up Instructions: F/u in 6 wks for PAP/Breast exam and vaccines   I discussed the assessment and treatment plan with the patient. The patient was provided an opportunity to ask questions and all were answered. The patient agreed with the plan and demonstrated an understanding of the instructions.   The patient was advised to call back or seek an in-person evaluation if the symptoms worsen or if the condition fails to improve as anticipated.  I provided 16 minutes of non-face-to-face time during this encounter.   Jonah Blue, MD

## 2018-12-14 ENCOUNTER — Other Ambulatory Visit: Payer: Self-pay

## 2018-12-14 ENCOUNTER — Encounter (HOSPITAL_COMMUNITY): Payer: Self-pay

## 2018-12-14 ENCOUNTER — Ambulatory Visit (HOSPITAL_COMMUNITY)
Admission: RE | Admit: 2018-12-14 | Discharge: 2018-12-14 | Disposition: A | Discharge status: Home / Self Care | Payer: Self-pay | Source: Ambulatory Visit | Attending: Cardiology | Admitting: Cardiology

## 2018-12-14 ENCOUNTER — Other Ambulatory Visit (HOSPITAL_COMMUNITY): Payer: Self-pay

## 2018-12-14 DIAGNOSIS — I5022 Chronic systolic (congestive) heart failure: Secondary | ICD-10-CM | POA: Insufficient documentation

## 2018-12-14 LAB — HEPATIC FUNCTION PANEL
ALT: 9 U/L (ref 0–44)
AST: 13 U/L — ABNORMAL LOW (ref 15–41)
Albumin: 3.9 g/dL (ref 3.5–5.0)
Alkaline Phosphatase: 81 U/L (ref 38–126)
Bilirubin, Direct: 0.2 mg/dL (ref 0.0–0.2)
Indirect Bilirubin: 0.7 mg/dL (ref 0.3–0.9)
Total Bilirubin: 0.9 mg/dL (ref 0.3–1.2)
Total Protein: 7.5 g/dL (ref 6.5–8.1)

## 2018-12-14 LAB — LIPID PANEL
Cholesterol: 232 mg/dL — ABNORMAL HIGH (ref 0–200)
HDL: 43 mg/dL (ref >40)
LDL Cholesterol: 136 mg/dL — ABNORMAL HIGH (ref 0–99)
Total CHOL/HDL Ratio: 5.4 RATIO
Triglycerides: 265 mg/dL — ABNORMAL HIGH (ref <150)
VLDL: 53 mg/dL — ABNORMAL HIGH (ref 0–40)

## 2018-12-14 MED ORDER — ATORVASTATIN CALCIUM 80 MG PO TABS: 80.0000 mg | ORAL_TABLET | Freq: Every day | ORAL | 3 refills | Status: DC

## 2018-12-14 NOTE — Telephone Encounter (Signed)
-----   Message from Dalton S McLean, MD sent at 12/14/2018 10:58 AM EDT ----- It looks like Misty Miller stopped her cholesterol medication.  Please have her restart atorvastatin 80 mg daily.  If she had side effects with atorvastatin (muscle cramps), she can use Crestor 20 mg daily instead.  Lipids/LFTs in 2 months. 

## 2018-12-24 ENCOUNTER — Telehealth (HOSPITAL_COMMUNITY): Payer: Self-pay

## 2018-12-24 NOTE — Telephone Encounter (Signed)
-----   Message from Larey Dresser, MD sent at 12/14/2018 10:58 AM EDT ----- It looks like Mrs Dambrosio stopped her cholesterol medication.  Please have her restart atorvastatin 80 mg daily.  If she had side effects with atorvastatin (muscle cramps), she can use Crestor 20 mg daily instead.  Lipids/LFTs in 2 months.

## 2018-12-24 NOTE — Telephone Encounter (Signed)
Spoke with patient, she is taking Atorvastatin however did miss several doses. Pt also admits to eating a lot of high fat foods and will make every attempt improve her diet and make better meal choices.  Pt due to return to clinic 8/5 for repeat labs. Pt also requesting refills for furosemide and isosorbide however already on file as refilled recently. Pt made aware of same

## 2018-12-28 MED FILL — SPIRONOLACTONE 50 MG TABLET: 50 | 90 days supply | Qty: 90 | Fill #1

## 2018-12-28 MED FILL — FUROSEMIDE 40 MG TAB: 40 | 30 days supply | Qty: 30 | Fill #2

## 2018-12-28 MED FILL — hydrALAZINE HCL 50 MG TABS: 50 | 30 days supply | Qty: 90 | Fill #1

## 2018-12-28 MED FILL — ISOSORBIDE MN ER 30 MG TAB: 30 | 30 days supply | Qty: 30 | Fill #2

## 2019-01-24 ENCOUNTER — Other Ambulatory Visit: Payer: Self-pay

## 2019-01-25 ENCOUNTER — Ambulatory Visit: Payer: Self-pay | Admitting: Internal Medicine

## 2019-01-25 ENCOUNTER — Encounter: Payer: Self-pay | Admitting: Internal Medicine

## 2019-01-25 VITALS — BP 130/70 | HR 97 | Ht 62.0 in | Wt 179.0 lb

## 2019-01-25 DIAGNOSIS — E1165 Type 2 diabetes mellitus with hyperglycemia: Secondary | ICD-10-CM

## 2019-01-25 DIAGNOSIS — E785 Hyperlipidemia, unspecified: Secondary | ICD-10-CM

## 2019-01-25 DIAGNOSIS — E669 Obesity, unspecified: Secondary | ICD-10-CM

## 2019-01-25 DIAGNOSIS — E1159 Type 2 diabetes mellitus with other circulatory complications: Secondary | ICD-10-CM

## 2019-01-25 LAB — POCT GLYCOSYLATED HEMOGLOBIN (HGB A1C): Hemoglobin A1C: 7.3 % — AB (ref 4.0–5.6)

## 2019-01-25 MED ORDER — GLUCOSE BLOOD VI STRP: ORAL_STRIP | 3 refills | Status: AC

## 2019-01-25 MED ORDER — ONETOUCH DELICA LANCETS 33G MISC: 3 refills | Status: AC

## 2019-01-25 NOTE — Patient Instructions (Signed)
Please continue: - Metformin 1000 mg 2x a day - NPH 15 units in am and 10 units at bedtime  Please come back for a follow-up appointment in 3 months.

## 2019-01-25 NOTE — Progress Notes (Signed)
Patient ID: Misty Miller, female   DOB: 10/09/1960, 58 y.o.   MRN: [id number]  HPI: Misty Miller is a 58 y.o.-year-old female, initially referred by her cardiologist, Dr. Shirlee LatchMcLean,  returning for follow-up for DM2, dx in 2012, prev. Prediabetes in 1994 (GDM), insulin-dependent, uncontrolled, with complications (CAD-s/p RCA stent, AMI 2016; CHF, stage III CKD).  Reviewed latest HbA1c levels: Lab Results  Component Value Date   HGBA1C 10.9 (H) 08/23/2018   HGBA1C 15 12/20/2016   HGBA1C 8.5 (H) 08/16/2014   HGBA1C 8.8 (H) 08/16/2014   Pt was on: - Metformin 1000 mg in a.m. and 500 mg in p.m.  However, this was changed to insulin: - Lantus 46 units at bedtime - Novolog 15 units 3x a day, before meals   At last visit she had no insurance and was off all of her diabetes medications.  At that time, we started: - Metformin 1000 mg 2x a day - NPH 15 units in am and 10 units at bedtime  She is checking sugars 1-2 times a day: - am: n/c >> 100, 137-181 (pasta), 192 - 2h after b'fast: n/c - before lunch: n/c - 2h after lunch: n/c >> 123, 129 - before dinner: n/c - 2h after dinner: n/c - bedtime: n/c - nighttime: n/c Lowest sugar was 160 >> 100; it is unclear at which level she has hypoglycemia awareness. Highest sugar was 618 - 2018 >> 226.  Glucometer: One Touch ultra  Pt's meals are: - Breakfast: eggs, grits, bacon, toat, bagel or oatmeal - Lunch: skips - Dinner: salad, chicken, fish, [place] + veggies - Snacks: 1 pack PB crackers  -after dinner She was eating a small bag of potato chips every night >> stopped >> sugars better! She walks 3x a week.  -+ CKD stage III, last BUN/creatinine:  Lab Results  Component Value Date   BUN 13 08/23/2018   BUN 16 03/09/2018   CREATININE 0.99 08/23/2018   CREATININE 0.98 03/09/2018   -+ HL; last set of lipids: Lab Results  Component Value Date   CHOL 232 (H) 12/14/2018   HDL 43 12/14/2018   LDLCALC 136 (H) 12/14/2018   TRIG  265 (H) 12/14/2018   CHOLHDL 5.4 12/14/2018  She is on Lipitor 80 mg daily.  - last eye exam was in 12/2018: No DR  - no numbness and tingling in her feet.  Pt has FH of DM in Mother.  She also has a history of HTN, OSA-on CPAP, obesity.  ROS: Constitutional: + weight gain/no weight loss, no fatigue, no subjective hyperthermia, no subjective hypothermia Eyes: no blurry vision, no xerophthalmia ENT: no sore throat, no nodules palpated in neck, no dysphagia, no odynophagia, no hoarseness Cardiovascular: no CP/no SOB/no palpitations/no leg swelling Respiratory: no cough/no SOB/no wheezing Gastrointestinal: no N/no V/no D/no C/no acid reflux Musculoskeletal: no muscle aches/no joint aches Skin: no rashes, no hair loss Neurological: no tremors/no numbness/no tingling/no dizziness  I reviewed pt's medications, allergies, PMH, social hx, family hx, and changes were documented in the history of present illness. Otherwise, unchanged from my initial visit note.  Past Medical History:  Diagnosis Date  . CHF (congestive heart failure) (HCC) 08/16/2014  . Chronic kidney disease 08/16/2014  . Coronary artery disease    cath 12/23/2014 single vessel dx, DES to RCA, moderate mid RCA residual  . Family history of adverse reaction to anesthesia    "it's hard to wake my mom after procedure is over"  . History of blood transfusion  1992   S/P miscarriage  . Hyperlipemia   . Hypertension   . Myocardial infarction (HCC) 08/16/2014   "mild"  . Obesity (BMI 30-39.9) 04/08/2015  . OSA (obstructive sleep apnea)    moderate with an AHI of   . Sleep apnea    "recently tested positive; no mask yet" (12/23/2014)  . Type II diabetes mellitus (HCC)    Past Surgical History:  Procedure Laterality Date  . CARDIAC CATHETERIZATION N/A 12/23/2014   Procedure: Left Heart Cath and Coronary Angiography;  Surgeon: Laurey Moralealton S McLean, MD;  [facility];  Service: Cardiovascular;  Laterality: N/A;  .  CARDIAC CATHETERIZATION N/A 12/23/2014   Procedure: Intravascular Pressure Wire/FFR Study;  Surgeon: Lyn RecordsHenry W Smith, MD;  [facility];  Service: Cardiovascular;  Laterality: N/A;  . CARDIAC CATHETERIZATION N/A 12/23/2014   Procedure: Coronary Stent Intervention;  Surgeon: Lyn RecordsHenry W Smith, MD;  [facility];  Service: Cardiovascular;  Laterality: N/A;  . CESAREAN SECTION  06/1995  . CORONARY ANGIOPLASTY WITH STENT PLACEMENT  12/23/2014   "1"  . 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 Marital status: Married    Spouse name: Not on file  . Number of children: 1  . Years of education: Not on file  . Highest education level: Not on file  Occupational History  . Job counselor  Social Needs  . Financial resource strain: Not on file  . Food insecurity:    Worry: Not on file    Inability: Not on file  . Transportation needs:    Medical: Not on file    Non-medical: Not on file  Tobacco Use  . Smoking status: Former Smoker    Packs/day: 0.25    Years: 20.00    Pack years: 5.00    Types: Cigarettes    Last attempt to quit: 11/09/1998    Years since quitting: 19.9  . Smokeless tobacco: Never Used  Substance and Sexual Activity  . Alcohol use: Yes    Comment: beer  - 3 a mo  . Drug use: No   Current Outpatient Medications on File Prior to Visit  Medication Sig Dispense Refill  . aspirin EC 81 MG EC tablet Take 1 tablet (81 mg total) by mouth daily.    Marland Kitchen. atorvastatin (LIPITOR) 80 MG tablet Take 1 tablet (80 mg total) by mouth daily at 6 PM. 90 tablet 3  . carvedilol (COREG) 25 MG tablet Take 1 tablet (25 mg total) by mouth 2 (two) times daily with a meal. 180 tablet 0  . furosemide (LASIX) 40 MG tablet Take 1 tablet (40 mg total) by mouth daily. 30 tablet 3  . hydrALAZINE (APRESOLINE) 50 MG tablet Take 1 tablet (50 mg total) by mouth 3 (three) times daily. 90 tablet 11  . insulin NPH Human (NOVOLIN N  RELION) 100 UNIT/ML injection Inject 15 units in am and 10 units at bedtime under skin - Relion brand 10 mL 11  . isosorbide mononitrate (IMDUR) 30 MG 24 hr tablet Take 1 tablet (30 mg total) by mouth daily. 30 tablet 11  . metFORMIN (GLUCOPHAGE) 1000 MG tablet Take 1 tablet (1,000 mg total) by mouth 2 (two) times daily with a meal. 180 tablet 3  . ONE TOUCH ULTRA TEST test strip USE TO TEST BLOOD SUGAR TWICE DAILY  3  . spironolactone (ALDACTONE) 50 MG tablet Take 1 tablet (50 mg total)  by mouth daily. 90 tablet 3   No current facility-administered medications on file prior to visit.    No Known Allergies Family History  Problem Relation Age of Onset  . Cancer Mother     PE: BP 130/70   Pulse 97   Ht 5\' 2"  (1.575 m)   Wt 179 lb (81.2 kg)   LMP  (LMP Unknown)   SpO2 97%   BMI 32.74 kg/m  Wt Readings from Last 3 Encounters:  01/25/19 179 lb (81.2 kg)  10/12/18 161 lb 3.2 oz (73.1 kg)  09/17/18 168 lb (76.2 kg)   Constitutional: overweight, in NAD Eyes: PERRLA, EOMI, no exophthalmos ENT: moist mucous membranes, no thyromegaly, no cervical lymphadenopathy Cardiovascular: tachycardia, RR, No MRG Respiratory: CTA B Gastrointestinal: abdomen soft, NT, ND, BS+ Musculoskeletal: no deformities, strength intact in all 4 Skin: moist, warm, no rashes Neurological: no tremor with outstretched hands, DTR normal in all 4  ASSESSMENT: 1. DM2, insulin-dependent, uncontrolled, with long-term complications - CAD, s/p RCA stent; h/o AMI 2016 - systolic CHF - CKD stage III  2. HL  3.  Overweight  PLAN:  1. Patient with longstanding, uncontrolled diabetes, previously on oral antidiabetic regimen, then basal/bolus insulin regimen, which she stopped for a year before our last visit as she could not afford it.  She was also not checking sugars.  She has a history of sugars as high as 600s but before last visit they were in the 300s.  She was also losing weight and feeling poorly.  HbA1c  before last visit was very high, at 10.9%.  At that time, since she did not have insurance, we started her on ReliOn NPH insulin vials from [employer].  We also started metformin and increase the dose to 1000 mg twice a day.  She used metformin in the past and she tolerated this well and she continues to do well with it.  I also advised her to buy a ReliOn glucometer from [place] and start checking sugars twice a day. She started to do this. Se also got a OneTouch Verio meter. -At this visit, sugars are much improved and they continue to improve especially after stopping potato chips at night.  She is still eating a banana and nuts before bedtime as she has an early dinner.  We discussed about moving dinner: Later, around 7 PM so that she does not get hungry and needs to snack before bedtime.  Also, we discussed about moving the high glycemic index foods at the end of the meal, and desserts, rather than between meals. -She is not checking sugars at night so it is unclear whether her sugars in the morning are high because of post dinner hyperglycemia or hepatic gluconeogenesis overnight.  For now, I advised her to start checking sugars at bedtime approximately twice a week and start working on what she eats in the evening, but I would not change her regimen. -We also need to check her for type 1 diabetes as at last visit her sugars were too high to check her insulin production.  At today's visit, her glucose in the office is 178 so we can check this today - I suggested to:  Patient Instructions  Please continue: - Metformin 1000 mg 2x a day - NPH 15 units in am and 10 units at bedtime  Please come back for a follow-up appointment in 3 months.  - we checked her HbA1c: 7.3% (significant improvement) - advised to check sugars at different times of the  day - 2x a day, rotating check times - advised for yearly eye exams >> she is UTD - return to clinic in 3-4 months  2. HL - Reviewed latest lipid panel from  2.5 months ago: LDL above target, triglycerides high Lab Results  Component Value Date   CHOL 232 (H) 12/14/2018   HDL 43 12/14/2018   LDLCALC 136 (H) 12/14/2018   TRIG 265 (H) 12/14/2018   CHOLHDL 5.4 12/14/2018  - Continues the statin without side effects.  3.  Overweight -She gained 18 pounds since last visit most likely due to insulin effect, but she lost a significant amount of weight before and she is actually feeling much better now -We discussed about improving diet  Orders Placed This Encounter  Procedures  . C-peptide  . Glucose, fasting  . ZNT8 Antibodies  . Anti-islet cell antibody  . Glutamic acid decarboxylase auto abs  . POCT glycosylated hemoglobin (Hb A1C)   Component     Latest Ref Rng & Units 01/25/2019          C-Peptide     0.80 - 3.85 ng/mL 2.13  Glucose, Plasma     65 - 99 mg/dL 172 (H)  ZNT8 Antibodies     U/mL <15  Islet Cell Ab     Neg:<1:1 Negative  Glutamic Acid Decarb Ab     <5 IU/mL <5   Tests for type 1 diabetes are negative.  Philemon Kingdom, MD PhD [facility]

## 2019-01-29 LAB — GLUTAMIC ACID DECARBOXYLASE AUTO ABS: Glutamic Acid Decarb Ab: <5 IU/mL (ref <5)

## 2019-01-29 LAB — C-PEPTIDE: C-Peptide: 2.13 ng/mL (ref 0.80–3.85)

## 2019-01-29 LAB — GLUCOSE, FASTING: Glucose, Plasma: 172 mg/dL — ABNORMAL HIGH (ref 65–99)

## 2019-01-31 LAB — ANTI-ISLET CELL ANTIBODY: Islet Cell Ab: NEGATIVE

## 2019-01-31 LAB — ZNT8 ANTIBODIES: ZNT8 Antibodies: <15 U/mL

## 2019-02-05 ENCOUNTER — Other Ambulatory Visit: Payer: Self-pay | Admitting: Internal Medicine

## 2019-02-13 ENCOUNTER — Other Ambulatory Visit (HOSPITAL_COMMUNITY): Payer: Self-pay

## 2019-02-14 MED FILL — FUROSEMIDE 40 MG TAB: 40 | 30 days supply | Qty: 30 | Fill #3

## 2019-02-15 ENCOUNTER — Ambulatory Visit (HOSPITAL_COMMUNITY)
Admission: RE | Admit: 2019-02-15 | Discharge: 2019-02-15 | Disposition: A | Discharge status: Home / Self Care | Payer: Self-pay | Source: Ambulatory Visit | Attending: Internal Medicine | Admitting: Internal Medicine

## 2019-02-15 ENCOUNTER — Other Ambulatory Visit: Payer: Self-pay

## 2019-02-15 DIAGNOSIS — I5022 Chronic systolic (congestive) heart failure: Secondary | ICD-10-CM | POA: Insufficient documentation

## 2019-02-15 LAB — LIPID PANEL
Cholesterol: 142 mg/dL (ref 0–200)
HDL: 43 mg/dL (ref >40)
LDL Cholesterol: 78 mg/dL (ref 0–99)
Total CHOL/HDL Ratio: 3.3 RATIO
Triglycerides: 107 mg/dL (ref <150)
VLDL: 21 mg/dL (ref 0–40)

## 2019-02-15 LAB — HEPATIC FUNCTION PANEL
ALT: 11 U/L (ref 0–44)
AST: 13 U/L — ABNORMAL LOW (ref 15–41)
Albumin: 4 g/dL (ref 3.5–5.0)
Alkaline Phosphatase: 103 U/L (ref 38–126)
Bilirubin, Direct: 0.1 mg/dL (ref 0.0–0.2)
Indirect Bilirubin: 0.6 mg/dL (ref 0.3–0.9)
Total Bilirubin: 0.7 mg/dL (ref 0.3–1.2)
Total Protein: 6.9 g/dL (ref 6.5–8.1)

## 2019-02-22 MED FILL — CARVEDILOL 25 MG TABLET: 25 | 30 days supply | Qty: 60 | Fill #2

## 2019-02-22 MED FILL — ISOSORBIDE MN ER 30 MG TAB: 30 | 30 days supply | Qty: 30 | Fill #3

## 2019-04-09 ENCOUNTER — Other Ambulatory Visit: Payer: Self-pay

## 2019-04-09 ENCOUNTER — Ambulatory Visit (HOSPITAL_COMMUNITY)
Admission: RE | Admit: 2019-04-09 | Discharge: 2019-04-09 | Disposition: A | Discharge status: Home / Self Care | Payer: Self-pay | Source: Ambulatory Visit | Attending: Cardiology | Admitting: Cardiology

## 2019-04-09 ENCOUNTER — Encounter (HOSPITAL_COMMUNITY): Payer: Self-pay | Admitting: Cardiology

## 2019-04-09 VITALS — BP 143/68 | HR 71 | Wt 178.0 lb

## 2019-04-09 DIAGNOSIS — Z7982 Long term (current) use of aspirin: Secondary | ICD-10-CM | POA: Insufficient documentation

## 2019-04-09 DIAGNOSIS — G4733 Obstructive sleep apnea (adult) (pediatric): Secondary | ICD-10-CM | POA: Insufficient documentation

## 2019-04-09 DIAGNOSIS — I10 Essential (primary) hypertension: Secondary | ICD-10-CM

## 2019-04-09 DIAGNOSIS — Z809 Family history of malignant neoplasm, unspecified: Secondary | ICD-10-CM | POA: Insufficient documentation

## 2019-04-09 DIAGNOSIS — I5022 Chronic systolic (congestive) heart failure: Secondary | ICD-10-CM

## 2019-04-09 DIAGNOSIS — Z79899 Other long term (current) drug therapy: Secondary | ICD-10-CM | POA: Insufficient documentation

## 2019-04-09 DIAGNOSIS — N189 Chronic kidney disease, unspecified: Secondary | ICD-10-CM | POA: Insufficient documentation

## 2019-04-09 DIAGNOSIS — Z955 Presence of coronary angioplasty implant and graft: Secondary | ICD-10-CM | POA: Insufficient documentation

## 2019-04-09 DIAGNOSIS — I130 Hypertensive heart and chronic kidney disease with heart failure and stage 1 through stage 4 chronic kidney disease, or unspecified chronic kidney disease: Secondary | ICD-10-CM | POA: Insufficient documentation

## 2019-04-09 DIAGNOSIS — E1122 Type 2 diabetes mellitus with diabetic chronic kidney disease: Secondary | ICD-10-CM | POA: Insufficient documentation

## 2019-04-09 DIAGNOSIS — Z7902 Long term (current) use of antithrombotics/antiplatelets: Secondary | ICD-10-CM | POA: Insufficient documentation

## 2019-04-09 DIAGNOSIS — I5042 Chronic combined systolic (congestive) and diastolic (congestive) heart failure: Secondary | ICD-10-CM | POA: Insufficient documentation

## 2019-04-09 DIAGNOSIS — Z794 Long term (current) use of insulin: Secondary | ICD-10-CM | POA: Insufficient documentation

## 2019-04-09 DIAGNOSIS — E785 Hyperlipidemia, unspecified: Secondary | ICD-10-CM | POA: Insufficient documentation

## 2019-04-09 DIAGNOSIS — Z87891 Personal history of nicotine dependence: Secondary | ICD-10-CM | POA: Insufficient documentation

## 2019-04-09 DIAGNOSIS — I2510 Atherosclerotic heart disease of native coronary artery without angina pectoris: Secondary | ICD-10-CM | POA: Insufficient documentation

## 2019-04-09 LAB — BASIC METABOLIC PANEL
Anion gap: 9 (ref 5–15)
BUN: 12 mg/dL (ref 6–20)
CO2: 26 mmol/L (ref 22–32)
Calcium: 9.1 mg/dL (ref 8.9–10.3)
Chloride: 103 mmol/L (ref 98–111)
Creatinine, Ser: 1.09 mg/dL — ABNORMAL HIGH (ref 0.44–1.00)
GFR calc Af Amer: >60 mL/min (ref >60)
GFR calc non Af Amer: 56 mL/min — ABNORMAL LOW (ref >60)
Glucose, Bld: 153 mg/dL — ABNORMAL HIGH (ref 70–99)
Potassium: 4.2 mmol/L (ref 3.5–5.1)
Sodium: 138 mmol/L (ref 135–145)

## 2019-04-09 MED ORDER — ISOSORBIDE MONONITRATE ER 60 MG PO TB24: 60.0000 mg | ORAL_TABLET | Freq: Every day | ORAL | 5 refills | Status: DC

## 2019-04-09 MED ORDER — HYDRALAZINE HCL 25 MG PO TABS: 75.0000 mg | ORAL_TABLET | Freq: Three times a day (TID) | ORAL | 5 refills | Status: DC

## 2019-04-09 MED FILL — ISOSORBIDE MN ER 60 MG TAB: 60 | 30 days supply | Qty: 30 | Fill #0

## 2019-04-09 MED FILL — hydrALAZINE HCL 25 MG TABS: 25 | 15 days supply | Qty: 135 | Fill #0

## 2019-04-09 NOTE — Patient Instructions (Addendum)
Labs done today. We will contact you only if your labs are abnormal.  INCREASE Hydralazine 75mg (3 tabs) by mouth 3 times daily.  INCREASE Imdur 60mg (1 tab) by mouth once daily.  Your physician recommends that you schedule a follow-up appointment in: 6 months. We will contact you to schedule an appointment.  At the [facility], you and your health needs are our priority. As part of our continuing mission to provide you with exceptional heart care, we have created designated Provider Care Teams. These Care Teams include your primary Cardiologist (physician) and Advanced Practice Providers (APPs- Physician Assistants and Nurse Practitioners) who all work together to provide you with the care you need, when you need it.   You may see any of the following providers on your designated Care Team at your next follow up: Marland Kitchen Dr Glori Bickers . Dr Loralie Champagne . Darrick Grinder, NP   Please be sure to bring in all your medications bottles to every appointment.

## 2019-04-09 NOTE — Progress Notes (Signed)
Date:  04/09/2019   ID:  [name], [name] 05-Mar-1961, MRN [id number]  Provider location: [address] [place] Type of Visit: Established patient  PCP:  Marcine Matar, MD  Cardiologist:  No primary care provider on file. Primary HF: Dr. Shirlee Latch  Chief Complaint: Exertional dyspnea   History of Present Illness: Misty Miller is a 58 y.o. female who has poorly controlled HTN and CHF.  She has had HTN and diabetes for years.  She was admitted to [facility] in 2/16 with a hypertensive emergency and flash pulmonary edema. She had been off irbesartan for months.  She was initially given IV Lasix (just one dose) and started back on irbesartan.  Creatinine rose from 0.95 at admission to 2.71.  Irbesartan and Lasix were stopped.  Echo was done, showing EF 35-40% with basal inferior akinesis and mild to moderate MR.  Troponin was mildly elevated to peak 0.35.  Due to elevated creatinine, she did not have cardiac cath initially.  Lexiscan Cardiolite was done, showing EF 35% but no ischemia or infarction.  V/Q scan was normal.  BP was controlled and she was sent home. In 6/16, she finally had cardiac cath showing severe RCA stenosis that was treated with DES. Echo in 5/17 showed improvement in EF back to 55%, echo in 5/18 with EF 55-60%.   She returns for followup of CHF and HTN.  She has been doing better overall.  She is taking her meds, SBP generally around 140.  She is going to the gym, using a treadmill and walking about 1 mile daily.  No chest pain.  No exertional dyspnea.  Blood glucose control is getting better, last HgbA1c was 7.3.    Labs (2/16): K 4.3, creatinine 0.95 => 2.71 => 1.78, TnI 0.35, Hgb 10, LDL 149, HCT 31, TSH normal, plasma aldosterone 1, urine catecholeamines normal.  Labs (3/16): K 4, creatinine 1.11 Labs (4/16): K 3.9, creatinine 1.09 Labs (12/24/14): K 4.1, creatinine 1.08, P2Y12 261 Labs 02/23/2015: K 3.6 Creatinine 0.90, HCT 35.4, BNP 18 Labs  (10/16): K 4.1, creatinine 1.03, BNP 14 Labs (2/18): Creatinine 1.09, K 4.1 Labs (7/18): K 4, creatinine 1.15 Labs (8/19): LDL 89, K 3.6, creatinine 6.19 Labs (2/20): LDL 85, HDL 48, K 3.8, creatinine 5.09 Labs (8/20): LDL 78, HDL 43  PMH: 1. HTN: For > 18 yrs, poor control historically. Renal artery dopplers showed no renal artery stenosis (2/16).   2. Type II diabetes. 3. 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  Admitted 2/16 with hypertensive emergency and pulmonary edema.  Echo (2/16) with EF 35-40%, basal inferior akinesis, mild-moderate MR.  Lexiscan Cardiolite (2/16) with EF 35%, no ischemia or infarction but LHC showed severe RCA disease, as below.    - Echo (5/17): EF 55%, mild LVH.  - Echo (5/18): EF 55-60%, mild MR 4. Hyperlipidemia 5. AKI/CKD: AKI in 2/16 in the setting of ARB and diuresis.  6. CAD: LHC (6/16) with 90% proximal RCA, 50% mRCA, 60% dRCA.  Promus DES to proximal RCA.  7. OSA: Moderate, using CPAP. 8. Inadequate platelet inhibition with Plavix.  9. Palpitations: Holter in 4/16 showed primarily NSR, rare PVCs, with 10 beat run of AIVR.  Current Outpatient Medications  Medication Sig Dispense Refill  . aspirin EC 81 MG EC tablet Take 1 tablet (81 mg total) by mouth daily.    Marland Kitchen atorvastatin (LIPITOR) 80 MG tablet Take 1 tablet (80 mg total) by  mouth daily at 6 PM. 90 tablet 3  . carvedilol (COREG) 25 MG tablet Take 1 tablet (25 mg total) by mouth 2 (two) times daily with a meal. 180 tablet 0  . furosemide (LASIX) 40 MG tablet Take 1 tablet (40 mg total) by mouth daily. 30 tablet 3  . glucose blood test strip Use 2x a day - verio 200 each 3  . hydrALAZINE (APRESOLINE) 25 MG tablet Take 3 tablets (75 mg total) by mouth 3 (three) times daily. 135 tablet 5  . insulin NPH Human (NOVOLIN N RELION) 100 UNIT/ML injection Inject 15 units in am and 10 units at bedtime under skin - Relion brand 10 mL 11  . isosorbide mononitrate (IMDUR)  60 MG 24 hr tablet Take 1 tablet (60 mg total) by mouth daily. 30 tablet 5  . metFORMIN (GLUCOPHAGE) 1000 MG tablet Take 1 tablet (1,000 mg total) by mouth 2 (two) times daily with a meal. 180 tablet 3  . OneTouch Delica Lancets 76P MISC Use 2x a day 200 each 3  . spironolactone (ALDACTONE) 50 MG tablet Take 1 tablet (50 mg total) by mouth daily. 90 tablet 3   No current facility-administered medications for this encounter.     Allergies:   Patient has no known allergies.   Social History:  The patient  reports that she quit smoking about 20 years ago. Her smoking use included cigarettes. She has a 5.00 pack-year smoking history. She has never used smokeless tobacco. She reports current alcohol use. She reports that she does not use drugs.   Family History:  The patient's family history includes Cancer in her mother.   ROS:  Please see the history of present illness.   All other systems are personally reviewed and negative.   Exam:   BP (!) 143/68   Pulse 71   Wt 80.7 kg (178 lb)   LMP  (LMP Unknown)   SpO2 97%   BMI 32.56 kg/m  General: NAD Neck: No JVD, no thyromegaly or thyroid nodule.  Lungs: Clear to auscultation bilaterally with normal respiratory effort. CV: Nondisplaced PMI.  Heart regular S1/S2, no S3/S4, no murmur.  No peripheral edema.  No carotid bruit.  Normal pedal pulses.  Abdomen: Soft, nontender, no hepatosplenomegaly, no distention.  Skin: Intact without lesions or rashes.  Neurologic: Alert and oriented x 3.  Psych: Normal affect. Extremities: No clubbing or cyanosis.  HEENT: Normal.     Recent Labs: 08/23/2018: Hemoglobin 12.6; Platelets 259 02/15/2019: ALT 11 04/09/2019: BUN 12; Creatinine, Ser 1.09; Potassium 4.2; Sodium 138  Personally reviewed   Wt Readings from Last 3 Encounters:  04/09/19 80.7 kg (178 lb)  01/25/19 81.2 kg (179 lb)  10/12/18 73.1 kg (161 lb 3.2 oz)     ASSESSMENT AND PLAN:  1. Chronic systolic => diastolic CHF: EF 95-09% by echo  08/17/14.  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  HTN has been treated and she is s/p DES to RCA, and EF on 5/17 echo was improved back to 55%.  Echo in 5/18 was similar with EF 55-60%. She is not volume overloaded on exam.    - Continue Coreg 25 mg bid.   - Continue spironolactone 50 mg daily. BMET today.   - With BP still above goal, increase hydralazine to 75 mg tid and Imdur to 60 mg daily.  - She is on Lasix 40 mg daily, continue. 2. CAD: LHC 12/23/14 showed severe proximal RCA stenosis treated with Promus DES.  She was  a Plavix non-responder so was transitioned to Brilinta 90 mg bid => now off Brilinta.  No chest pain.  - Continue ASA 81.   - Atorvastatin increased to 80 mg daily after LDL > 70 on recent labs.  Repeat lipids/LFTs in 2 months.  3. HTN: She is back on her BP meds, BP is better controlled.  - Continue hydralazine 50 tid, Coreg 25 bid, amlodipine 10 daily, spironolactone 25 daily.  - She had marked AKI with ARB use in the past.  4. CKD: AKI in past after starting ARB, but fully recovered.  No evidence for renal artery stenosis on renal artery dopplers.  Suspect some baseline renal damage from long-standing HTN and diabetes but recent creatinine within normal range.    5. OSA: using CPAP. 6. Diabetes: She has followup with endocrinology.    Followup in 6 months.   Signed, [name], MD  04/09/2019  [facility] [address] [facility] [place] [address] [phone] (office) [phone] (fax)

## 2019-04-23 MED FILL — FUROSEMIDE 40 MG TAB: 40 | 30 days supply | Qty: 30 | Fill #0

## 2019-04-23 MED FILL — ISOSORBIDE MN ER 30 MG TAB: 30 | 30 days supply | Qty: 30 | Fill #4

## 2019-04-26 ENCOUNTER — Ambulatory Visit: Payer: Self-pay | Admitting: Internal Medicine

## 2019-05-03 MED FILL — FUROSEMIDE 40 MG TAB: 40 | 30 days supply | Qty: 30 | Fill #0

## 2019-05-27 ENCOUNTER — Encounter: Payer: Self-pay | Admitting: Internal Medicine

## 2019-05-27 ENCOUNTER — Ambulatory Visit (INDEPENDENT_AMBULATORY_CARE_PROVIDER_SITE_OTHER): Payer: Self-pay | Admitting: Internal Medicine

## 2019-05-27 VITALS — BP 140/80 | HR 75 | Ht 62.0 in | Wt 177.0 lb

## 2019-05-27 DIAGNOSIS — E669 Obesity, unspecified: Secondary | ICD-10-CM

## 2019-05-27 DIAGNOSIS — E785 Hyperlipidemia, unspecified: Secondary | ICD-10-CM

## 2019-05-27 DIAGNOSIS — E1159 Type 2 diabetes mellitus with other circulatory complications: Secondary | ICD-10-CM

## 2019-05-27 DIAGNOSIS — E1165 Type 2 diabetes mellitus with hyperglycemia: Secondary | ICD-10-CM

## 2019-05-27 LAB — POCT GLYCOSYLATED HEMOGLOBIN (HGB A1C): Hemoglobin A1C: 7.9 % — AB (ref 4.0–5.6)

## 2019-05-27 MED ORDER — GLIPIZIDE 5 MG PO TABS: 5.0000 mg | ORAL_TABLET | Freq: Every day | ORAL | 3 refills | Status: DC

## 2019-05-27 NOTE — Progress Notes (Signed)
Patient ID: Misty Miller, female   DOB: 10/02/1960, 58 y.o.   MRN: [id number]  HPI: Misty Miller is a 58 y.o.-year-old female, initially referred by her cardiologist, Dr. Shirlee LatchMcLean,  returning for follow-up for DM2, dx in 2012, prev. Prediabetes in 1994 (GDM), insulin-dependent, uncontrolled, with complications (CAD-s/p RCA stent, AMI 2016; CHF, stage III CKD).  Last visit 4 months ago.  At this visit, sugars are higher as she restarted to eat potato chips after dinner.   Reviewed HbA1c levels: Lab Results  Component Value Date   HGBA1C 7.3 (A) 01/25/2019   HGBA1C 10.9 (H) 08/23/2018   HGBA1C 15 12/20/2016   HGBA1C 8.5 (H) 08/16/2014   HGBA1C 8.8 (H) 08/16/2014   Investigation for type 1 diabetes was negative: Component     Latest Ref Rng & Units 01/25/2019          C-Peptide     0.80 - 3.85 ng/mL 2.13  Glucose, Plasma     65 - 99 mg/dL 409172 (H)  ZNT8 Antibodies     U/mL <15  Islet Cell Ab     Neg:<1:1 Negative  Glutamic Acid Decarb Ab     <5 IU/mL <5   Pt was on: - Metformin 1000 mg in a.m. and 500 mg in p.m.  However, this was changed to insulin: - Lantus 46 units at bedtime - Novolog 15 units 3x a day, before meals   At the beginning of the year, she had no insurance of her medications.  At that time, we started: - Metformin 1000 mg 2x a day - NPH 15 units in am and 10 units at bedtime  She is checking sugars 1-2 times a day: - am: n/c >> 100, 137-181 (pasta), 192 >> 123-194 - 2h after b'fast: n/c >> 237 - before lunch: n/c - 2h after lunch: n/c >> 123, 129 >> 143 - before dinner: n/c >> 124, 192 (5 pm) - 2h after dinner: n/c - bedtime: n/c >> 220-276 (9 pm) - nighttime: n/c Lowest sugar was 160 >> 100 >> 123; it is unclear at which level she has hypoglycemia awareness Highest sugar was 618 - 2018 >> 226 >> 276.  Glucometer: One Touch ultra  Pt's meals are: - Breakfast: eggs, grits, bacon, toat, bagel or oatmeal - Lunch: skips - Dinner: salad, chicken,  fish, [place] + veggies - Snacks: 1 pack PB crackers  -after dinner She was eating a small bag of potato chips every night >> stopped >> sugars better!  However, she restarted these since last visit. She continues to walk.  -+ CKD stage III; last BUN/creatinine:  Lab Results  Component Value Date   BUN 12 04/09/2019   BUN 13 08/23/2018   CREATININE 1.09 (H) 04/09/2019   CREATININE 0.99 08/23/2018   -+ HL; last set of lipids: Lab Results  Component Value Date   CHOL 142 02/15/2019   HDL 43 02/15/2019   LDLCALC 78 02/15/2019   TRIG 107 02/15/2019   CHOLHDL 3.3 02/15/2019  She is on Lipitor 80.  - last eye exam was in 12/2018: No DR  - no numbness and tingling in her feet.  Pt has FH of DM in Mother.  She also has a history of HTN, OSA-on CPAP, obesity.  ROS: Constitutional: no weight gain/no weight loss, no fatigue, no subjective hyperthermia, no subjective hypothermia Eyes: no blurry vision, no xerophthalmia ENT: no sore throat, no nodules palpated in neck, no dysphagia, no odynophagia, no hoarseness Cardiovascular: no CP/no SOB/no  palpitations/no leg swelling Respiratory: no cough/no SOB/no wheezing Gastrointestinal: no N/no V/no D/no C/no acid reflux Musculoskeletal: no muscle aches/no joint aches Skin: no rashes, no hair loss Neurological: no tremors/no numbness/no tingling/no dizziness  I reviewed pt's medications, allergies, PMH, social hx, family hx, and changes were documented in the history of present illness. Otherwise, unchanged from my initial visit note.  Past Medical History:  Diagnosis Date  . CHF (congestive heart failure) (HCC) 08/16/2014  . Chronic kidney disease 08/16/2014  . Coronary artery disease    cath 12/23/2014 single vessel dx, DES to RCA, moderate mid RCA residual  . Family history of adverse reaction to anesthesia    "it's hard to wake my mom after procedure is over"  . History of blood transfusion 1992   S/P miscarriage  . Hyperlipemia   .  Hypertension   . Myocardial infarction (HCC) 08/16/2014   "mild"  . Obesity (BMI 30-39.9) 04/08/2015  . OSA (obstructive sleep apnea)    moderate with an AHI of   . Sleep apnea    "recently tested positive; no mask yet" (12/23/2014)  . Type II diabetes mellitus (HCC)    Past Surgical History:  Procedure Laterality Date  . CARDIAC CATHETERIZATION N/A 12/23/2014   Procedure: Left Heart Cath and Coronary Angiography;  Surgeon: Laurey Morale, MD;  Location: [facility] INVASIVE CV LAB;  Service: Cardiovascular;  Laterality: N/A;  . CARDIAC CATHETERIZATION N/A 12/23/2014   Procedure: Intravascular Pressure Wire/FFR Study;  Surgeon: Lyn Records, MD;  Location: [facility] INVASIVE CV LAB;  Service: Cardiovascular;  Laterality: N/A;  . CARDIAC CATHETERIZATION N/A 12/23/2014   Procedure: Coronary Stent Intervention;  Surgeon: Lyn Records, MD;  Location: [facility] INVASIVE CV LAB;  Service: Cardiovascular;  Laterality: N/A;  . CESAREAN SECTION  06/1995  . CORONARY ANGIOPLASTY WITH STENT PLACEMENT  12/23/2014   "1"  . 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 Marital status: Married    Spouse name: Not on file  . Number of children: 1  . Years of education: Not on file  . Highest education level: Not on file  Occupational History  . Job counselor  Social Needs  . Financial resource strain: Not on file  . Food insecurity:    Worry: Not on file    Inability: Not on file  . Transportation needs:    Medical: Not on file    Non-medical: Not on file  Tobacco Use  . Smoking status: Former Smoker    Packs/day: 0.25    Years: 20.00    Pack years: 5.00    Types: Cigarettes    Last attempt to quit: 11/09/1998    Years since quitting: 19.9  . Smokeless tobacco: Never Used  Substance and Sexual Activity  . Alcohol use: Yes    Comment: beer  - 3 a mo  . Drug use: No   Current Outpatient Medications on File Prior to Visit  Medication Sig Dispense Refill  .  aspirin EC 81 MG EC tablet Take 1 tablet (81 mg total) by mouth daily.    Marland Kitchen atorvastatin (LIPITOR) 80 MG tablet Take 1 tablet (80 mg total) by mouth daily at 6 PM. 90 tablet 3  . carvedilol (COREG) 25 MG tablet Take 1 tablet (25 mg total) by mouth 2 (two) times daily with a meal. 180 tablet 0  . furosemide (LASIX) 40 MG tablet Take 1 tablet (40 mg total) by mouth daily.  30 tablet 3  . glucose blood test strip Use 2x a day - verio 200 each 3  . hydrALAZINE (APRESOLINE) 25 MG tablet Take 3 tablets (75 mg total) by mouth 3 (three) times daily. 135 tablet 5  . insulin NPH Human (NOVOLIN N RELION) 100 UNIT/ML injection Inject 15 units in am and 10 units at bedtime under skin - Relion brand 10 mL 11  . isosorbide mononitrate (IMDUR) 60 MG 24 hr tablet Take 1 tablet (60 mg total) by mouth daily. 30 tablet 5  . metFORMIN (GLUCOPHAGE) 1000 MG tablet Take 1 tablet (1,000 mg total) by mouth 2 (two) times daily with a meal. 180 tablet 3  . OneTouch Delica Lancets 07P MISC Use 2x a day 200 each 3  . spironolactone (ALDACTONE) 50 MG tablet Take 1 tablet (50 mg total) by mouth daily. 90 tablet 3   No current facility-administered medications on file prior to visit.    No Known Allergies Family History  Problem Relation Age of Onset  . Cancer Mother     PE: BP 140/80   Pulse 75   Ht 5\' 2"  (1.575 m)   Wt 177 lb (80.3 kg)   LMP  (LMP Unknown)   SpO2 97%   BMI 32.37 kg/m  Wt Readings from Last 3 Encounters:  05/27/19 177 lb (80.3 kg)  04/09/19 178 lb (80.7 kg)  01/25/19 179 lb (81.2 kg)   Constitutional: overweight, in NAD Eyes: PERRLA, EOMI, no exophthalmos ENT: moist mucous membranes, no thyromegaly, no cervical lymphadenopathy Cardiovascular: RRR, No MRG Respiratory: CTA B Gastrointestinal: abdomen soft, NT, ND, BS+ Musculoskeletal: no deformities, strength intact in all 4 Skin: moist, warm, no rashes Neurological: no tremor with outstretched hands, DTR normal in all 4  ASSESSMENT: 1.  DM2, insulin-dependent, uncontrolled, with long-term complications - CAD, s/p RCA stent; h/o AMI 7106 - systolic CHF - CKD stage III  2. HL  3.  Overweight  PLAN:  1. Patient with longstanding, uncontrolled, type 2 diabetes diabetic regimen, basal-bolus insulin regimen, which she stopped in the past but she could not afford it.  Her sugars increasing as high as 600s and she was also losing weight and feeling poorly.  HbA1c increased to 15%, then decreased to 10.9%.  As she did not have insurance, we started ReliOn NPH insulin vials from [place].  We also started Metformin.  Sugars improved significantly especially also that she improved diet.  HbA1c at last visit was much better, at 7.3%. -At last visit, we checked her for type 1 diabetes but the investigation was negative. -At this visit, sugars are higher especially at bedtime since she restarted to eat potato chips at night.  She usually has an early dinner, around 5 PM, but then snacks on potato chips.  Sugars check close to bedtime or in the middle of the night or in the 200s.  Sugars in the morning are also subsequently prior.  We discussed that she absolutely needs to stop eating the chips.  She agrees.  For now, we discussed about adding a tablet glipizide before a larger dinner, but I do not feel she needs other changes in her regimen - I suggested to:  Patient Instructions  Please continue: - Metformin 1000 mg 2x a day - NPH 15 units in a.m. and 10 units at bedtime  Try to add: - Glipizide 5 mg before a larger dinner  Please return in 3-4 months with your sugar log.   - we checked her HbA1c: 7.9% (  higher) - advised to check sugars at different times of the day - 2x a day, rotating check times - advised for yearly eye exams >> she is UTD - return to clinic in 3-4 months  2. HL -Reviewed latest lipid panel from 02/2019: All fractions at goal Lab Results  Component Value Date   CHOL 142 02/15/2019   HDL 43 02/15/2019    LDLCALC 78 02/15/2019   TRIG 107 02/15/2019   CHOLHDL 3.3 02/15/2019  -Continues the statin without side effects  3.  Overweight -She gained 18 pounds before last visit, most likely due to, but previously she lost a significant amount of weight since she was feeling better after the weight gain -Since last visit, no significant weight loss  Carlus Pavlov, MD PhD [facility] Endocrinology

## 2019-05-27 NOTE — Patient Instructions (Addendum)
Please continue: - Metformin 1000 mg 2x a day - NPH 15 units in a.m. and 10 units at bedtime  Try to add: - Glipizide 5 mg before a larger dinner  Please return in 3-4 months with your sugar log.

## 2019-05-27 NOTE — Addendum Note (Signed)
Addended by: Cardell Peach I on: 05/27/2019 03:57 PM   Modules accepted: Orders

## 2019-06-27 ENCOUNTER — Other Ambulatory Visit (HOSPITAL_COMMUNITY): Payer: Self-pay | Admitting: Cardiology

## 2019-06-27 MED FILL — ISOSORBIDE MN ER 30 MG TAB: 30 | 30 days supply | Qty: 30 | Fill #4

## 2019-06-27 MED FILL — hydrALAZINE HCL 50 MG TABS: 50 | 30 days supply | Qty: 90 | Fill #2

## 2019-06-27 MED FILL — FUROSEMIDE 40 MG TAB: 40 | 30 days supply | Qty: 30 | Fill #1

## 2019-06-27 MED FILL — CARVEDILOL 25 MG TABLET: 25 | 30 days supply | Qty: 60 | Fill #0

## 2019-07-11 MED FILL — FUROSEMIDE 40 MG TAB: 40 | 30 days supply | Qty: 30 | Fill #1

## 2019-07-11 MED FILL — CARVEDILOL 25 MG TABLET: 25 | 30 days supply | Qty: 60 | Fill #0

## 2019-07-11 MED FILL — hydrALAZINE HCL 25 MG TABS: 25 | 15 days supply | Qty: 135 | Fill #0

## 2019-07-11 MED FILL — ISOSORBIDE MN ER 60 MG TAB: 60 | 30 days supply | Qty: 30 | Fill #0

## 2019-07-11 MED FILL — SPIRONOLACTONE 50 MG TABLET: 50 | 90 days supply | Qty: 90 | Fill #2

## 2019-09-14 ENCOUNTER — Ambulatory Visit: Payer: Self-pay | Attending: Internal Medicine

## 2019-09-14 DIAGNOSIS — Z23 Encounter for immunization: Secondary | ICD-10-CM | POA: Insufficient documentation

## 2019-09-14 NOTE — Progress Notes (Signed)
   Covid-19 Vaccination Clinic  Name:  Misty Miller    MRN: [id number] DOB: 02/17/1961  09/14/2019  [name] was observed post Covid-19 immunization for 15 minutes without incident. She was provided with Vaccine Information Sheet and instruction to access the V-Safe system.   [name] was instructed to call 911 with any severe reactions post vaccine: Marland Kitchen Difficulty breathing  . Swelling of face and throat  . A fast heartbeat  . A bad rash all over body  . Dizziness and weakness   Immunizations Administered    Name Date Dose VIS Date Route   Pfizer COVID-19 Vaccine 09/14/2019  9:59 AM 0.3 mL 06/21/2019 Intramuscular   Manufacturer: [employer], [employer]   Lot: [id number]   NDC: [address]-5

## 2019-09-27 ENCOUNTER — Ambulatory Visit: Payer: Self-pay | Admitting: Internal Medicine

## 2019-10-02 MED FILL — ATORVASTATIN 80 MG TABLET: 80 | 30 days supply | Qty: 30 | Fill #1

## 2019-10-02 MED FILL — ISOSORBIDE MN ER 60 MG TAB: 60 | 30 days supply | Qty: 30 | Fill #1

## 2019-10-02 MED FILL — FUROSEMIDE 40 MG TAB: 40 | 30 days supply | Qty: 30 | Fill #2

## 2019-10-05 ENCOUNTER — Ambulatory Visit: Payer: Self-pay | Attending: Internal Medicine

## 2019-10-05 DIAGNOSIS — Z23 Encounter for immunization: Secondary | ICD-10-CM

## 2019-10-05 NOTE — Progress Notes (Signed)
   Covid-19 Vaccination Clinic  Name:  Misty Miller    MRN: [id number] DOB: 01/09/1961  10/05/2019  Misty Miller was observed post Covid-19 immunization for 15 minutes without incident. She was provided with Vaccine Information Sheet and instruction to access the V-Safe system.   Misty Miller was instructed to call 911 with any severe reactions post vaccine: Marland Kitchen Difficulty breathing  . Swelling of face and throat  . A fast heartbeat  . A bad rash all over body  . Dizziness and weakness   Immunizations Administered    Name Date Dose VIS Date Route   Pfizer COVID-19 Vaccine 10/05/2019 10:47 AM 0.3 mL 06/21/2019 Intramuscular   Manufacturer: [employer], [employer]   Lot: [id number]   NDC: [address]-8

## 2019-10-18 ENCOUNTER — Other Ambulatory Visit (HOSPITAL_COMMUNITY): Payer: Self-pay | Admitting: Cardiology

## 2019-10-18 MED FILL — FUROSEMIDE 40 MG TAB: 40 | 30 days supply | Qty: 30 | Fill #2

## 2019-10-18 MED FILL — SPIRONOLACTONE 50 MG TABS: 50 | 30 days supply | Qty: 30 | Fill #0

## 2019-10-18 MED FILL — ISOSORBIDE MN ER 60 MG TAB: 60 | 30 days supply | Qty: 30 | Fill #1

## 2019-10-31 ENCOUNTER — Encounter: Payer: Self-pay | Admitting: Internal Medicine

## 2019-10-31 ENCOUNTER — Ambulatory Visit (INDEPENDENT_AMBULATORY_CARE_PROVIDER_SITE_OTHER): Payer: Self-pay | Admitting: Internal Medicine

## 2019-10-31 VITALS — BP 130/80 | HR 72 | Ht 62.0 in | Wt 183.0 lb

## 2019-10-31 DIAGNOSIS — E1159 Type 2 diabetes mellitus with other circulatory complications: Secondary | ICD-10-CM

## 2019-10-31 DIAGNOSIS — E1165 Type 2 diabetes mellitus with hyperglycemia: Secondary | ICD-10-CM

## 2019-10-31 DIAGNOSIS — E785 Hyperlipidemia, unspecified: Secondary | ICD-10-CM

## 2019-10-31 DIAGNOSIS — E669 Obesity, unspecified: Secondary | ICD-10-CM

## 2019-10-31 LAB — POCT GLYCOSYLATED HEMOGLOBIN (HGB A1C): Hemoglobin A1C: 9.7 % — AB (ref 4.0–5.6)

## 2019-10-31 MED ORDER — INSULIN NPH (HUMAN) (ISOPHANE) 100 UNIT/ML ~~LOC~~ SUSP: SUBCUTANEOUS | 11 refills | Status: DC

## 2019-10-31 MED ORDER — GLIPIZIDE 5 MG PO TABS: 5.0000 mg | ORAL_TABLET | Freq: Every day | ORAL | 3 refills | Status: DC

## 2019-10-31 NOTE — Addendum Note (Signed)
Addended by: Darliss Ridgel I on: 10/31/2019 01:40 PM   Modules accepted: Orders

## 2019-10-31 NOTE — Patient Instructions (Addendum)
Please continue: - Metformin 1000 mg 2x a day  Please increase: - NPH 20 units in a.m. and 15 (may need 20 units) units at bedtime - Glipizide 5-10 mg before dinner  Please return in 3-4 months with your sugar log.

## 2019-10-31 NOTE — Progress Notes (Signed)
Patient ID: Misty Miller, female   DOB: 1961-02-26, 59 y.o.   MRN: [id number]   This visit occurred during the SARS-CoV-2 public health emergency.  Safety protocols were in place, including screening questions prior to the visit, additional usage of staff PPE, and extensive cleaning of exam room while observing appropriate contact time as indicated for disinfecting solutions.   HPI: Misty Miller is a 59 y.o.-year-old female, initially referred by her cardiologist, Dr. Shirlee Latch,  returning for follow-up for DM2, dx in 2012, prev. Prediabetes in 1994 (GDM), insulin-dependent, uncontrolled, with complications (CAD-s/p RCA stent, AMI 2016; CHF, stage III CKD).  Last visit 5 months ago.  Since last visit, she stopped walking and her sugars increased.  She plans to restart.  Reviewed HbA1c levels: Lab Results  Component Value Date   HGBA1C 7.9 (A) 05/27/2019   HGBA1C 7.3 (A) 01/25/2019   HGBA1C 10.9 (H) 08/23/2018   HGBA1C 15 12/20/2016   HGBA1C 8.5 (H) 08/16/2014   HGBA1C 8.8 (H) 08/16/2014   Pt was on: - Metformin 1000 mg in a.m. and 500 mg in p.m.  However, this was changed to insulin: - Lantus 46 units at bedtime - Novolog 15 units 3x a day, before meals   At the beginning of the year, she had no insurance of her medications.  Now on: - Metformin 1000 mg 2x a day - NPH 15 units in am and 10 units at bedtime - Glipizide 5 mg before a larger dinner-added 05/2019  She is checking sugars 1-2 times a day - am: n/c >> 100, 137-181 (pasta), 192 >> 123-194 >> 147, 164-245, 284 - 2h after b'fast: n/c >> 237  - before lunch: n/c >> 111, 178 - 2h after lunch: n/c >> 123, 129 >> 143 >> 173 - before dinner: n/c >> 124, 192 (5 pm) >> 113, 161-245 - 2h after dinner: n/c - bedtime: n/c >> 220-276 (9 pm) >> 264 - nighttime: n/c Lowest sugar was 160 >> 100 >> 123 >> 111; it is unclear at which level she has hypoglycemia awareness Highest sugar was 618 - 2018 >> 226 >> 276 >>  284.  Glucometer: One Touch ultra  Pt's meals are: - Breakfast: eggs, grits, bacon, toat, bagel or oatmeal - Lunch: skips - Dinner: salad, chicken, fish, [place] + veggies - Snacks: 1 pack PB crackers  -after dinner She was eating a small bag of potato chips every night >> stopped >> sugars better, however, before last visit, she restarted eating at night He stays active by walking.  + CKD stage III; last BUN/creatinine:  Lab Results  Component Value Date   BUN 12 04/09/2019   BUN 13 08/23/2018   CREATININE 1.09 (H) 04/09/2019   CREATININE 0.99 08/23/2018   + HL; last set of lipids: Lab Results  Component Value Date   CHOL 142 02/15/2019   HDL 43 02/15/2019   LDLCALC 78 02/15/2019   TRIG 107 02/15/2019   CHOLHDL 3.3 02/15/2019  On Lipitor 80.  - last eye exam was in 12/2018: No DR  -No numbness and tingling in her feet.  Pt has FH of DM in Mother.  She also has a history of HTN, OSA-on CPAP, obesity. No h/o pancreatitis. No FH of MTC.   ROS: Constitutional: + weight gain/no weight loss, no fatigue, no subjective hyperthermia, no subjective hypothermia Eyes: no blurry vision, no xerophthalmia ENT: no sore throat, no nodules palpated in neck, no dysphagia, no odynophagia, no hoarseness Cardiovascular: no CP/no SOB/no  palpitations/no leg swelling Respiratory: no cough/no SOB/no wheezing Gastrointestinal: no N/no V/no D/no C/no acid reflux Musculoskeletal: no muscle aches/no joint aches Skin: no rashes, no hair loss Neurological: no tremors/no numbness/no tingling/no dizziness  I reviewed pt's medications, allergies, PMH, social hx, family hx, and changes were documented in the history of present illness. Otherwise, unchanged from my initial visit note.  Past Medical History:  Diagnosis Date  . CHF (congestive heart failure) (HCC) 08/16/2014  . Chronic kidney disease 08/16/2014  . Coronary artery disease    cath 12/23/2014 single vessel dx, DES to RCA, moderate mid RCA  residual  . Family history of adverse reaction to anesthesia    "it's hard to wake my mom after procedure is over"  . History of blood transfusion 1992   S/P miscarriage  . Hyperlipemia   . Hypertension   . Myocardial infarction (HCC) 08/16/2014   "mild"  . Obesity (BMI 30-39.9) 04/08/2015  . OSA (obstructive sleep apnea)    moderate with an AHI of   . Sleep apnea    "recently tested positive; no mask yet" (12/23/2014)  . Type II diabetes mellitus (HCC)    Past Surgical History:  Procedure Laterality Date  . CARDIAC CATHETERIZATION N/A 12/23/2014   Procedure: Left Heart Cath and Coronary Angiography;  Surgeon: Laurey Morale, MD;  Location: [facility] INVASIVE CV LAB;  Service: Cardiovascular;  Laterality: N/A;  . CARDIAC CATHETERIZATION N/A 12/23/2014   Procedure: Intravascular Pressure Wire/FFR Study;  Surgeon: Lyn Records, MD;  Location: [facility] INVASIVE CV LAB;  Service: Cardiovascular;  Laterality: N/A;  . CARDIAC CATHETERIZATION N/A 12/23/2014   Procedure: Coronary Stent Intervention;  Surgeon: Lyn Records, MD;  Location: [facility] INVASIVE CV LAB;  Service: Cardiovascular;  Laterality: N/A;  . CESAREAN SECTION  06/1995  . CORONARY ANGIOPLASTY WITH STENT PLACEMENT  12/23/2014   "1"  . 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 Marital status: Married    Spouse name: Not on file  . Number of children: 1  . Years of education: Not on file  . Highest education level: Not on file  Occupational History  . Job counselor  Social Needs  . Financial resource strain: Not on file  . Food insecurity:    Worry: Not on file    Inability: Not on file  . Transportation needs:    Medical: Not on file    Non-medical: Not on file  Tobacco Use  . Smoking status: Former Smoker    Packs/day: 0.25    Years: 20.00    Pack years: 5.00    Types: Cigarettes    Last attempt to quit: 11/09/1998    Years since quitting: 19.9  . Smokeless tobacco: Never Used   Substance and Sexual Activity  . Alcohol use: Yes    Comment: beer  - 3 a mo  . Drug use: No   Current Outpatient Medications on File Prior to Visit  Medication Sig Dispense Refill  . aspirin EC 81 MG EC tablet Take 1 tablet (81 mg total) by mouth daily.    Marland Kitchen atorvastatin (LIPITOR) 80 MG tablet Take 1 tablet (80 mg total) by mouth daily at 6 PM. 90 tablet 3  . carvedilol (COREG) 25 MG tablet TAKE 1 TABLET (25 MG TOTAL) BY MOUTH 2 (TWO) TIMES DAILY WITH A MEAL. 60 tablet 0  . furosemide (LASIX) 40 MG tablet Take 1 tablet (40 mg total) by mouth daily.  30 tablet 3  . glipiZIDE (GLUCOTROL) 5 MG tablet Take 1 tablet (5 mg total) by mouth daily before supper. 90 tablet 3  . glucose blood test strip Use 2x a day - verio 200 each 3  . hydrALAZINE (APRESOLINE) 25 MG tablet Take 3 tablets (75 mg total) by mouth 3 (three) times daily. 135 tablet 5  . insulin NPH Human (NOVOLIN N RELION) 100 UNIT/ML injection Inject 15 units in am and 10 units at bedtime under skin - Relion brand 10 mL 11  . isosorbide mononitrate (IMDUR) 60 MG 24 hr tablet Take 1 tablet (60 mg total) by mouth daily. 30 tablet 5  . metFORMIN (GLUCOPHAGE) 1000 MG tablet Take 1 tablet (1,000 mg total) by mouth 2 (two) times daily with a meal. 180 tablet 3  . OneTouch Delica Lancets 33G MISC Use 2x a day 200 each 3  . spironolactone (ALDACTONE) 50 MG tablet TAKE 1 TABLET (50 MG TOTAL) BY MOUTH DAILY. 90 tablet 3   No current facility-administered medications on file prior to visit.   No Known Allergies Family History  Problem Relation Age of Onset  . Cancer Mother     PE: BP 130/80   Pulse 72   Ht 5\' 2"  (1.575 m)   Wt 183 lb (83 kg)   LMP  (LMP Unknown)   SpO2 97%   BMI 33.47 kg/m  Wt Readings from Last 3 Encounters:  10/31/19 183 lb (83 kg)  05/27/19 177 lb (80.3 kg)  04/09/19 178 lb (80.7 kg)   Constitutional: overweight, in NAD Eyes: PERRLA, EOMI, no exophthalmos ENT: moist mucous membranes, no thyromegaly, no  cervical lymphadenopathy Cardiovascular: RRR, No MRG Respiratory: CTA B Gastrointestinal: abdomen soft, NT, ND, BS+ Musculoskeletal: no deformities, strength intact in all 4 Skin: moist, warm, no rashes Neurological: no tremor with outstretched hands, DTR normal in all 4  ASSESSMENT: 1. DM2, insulin-dependent, uncontrolled, with long-term complications - CAD, s/p RCA stent; h/o AMI 2016 - systolic CHF - CKD stage III  Investigation for type 1 diabetes was negative: Component     Latest Ref Rng & Units 01/25/2019          C-Peptide     0.80 - 3.85 ng/mL 2.13  Glucose, Plasma     65 - 99 mg/dL 01/27/2019 (H)  ZNT8 Antibodies     U/mL <15  Islet Cell Ab     Neg:<1:1 Negative  Glutamic Acid Decarb Ab     <5 IU/mL <5   2. HL  3.  Obesity class I  PLAN:  1. Patient with longstanding, uncontrolled type 2 diabetes, on oral diabetic regimen with Metformin, also intermediate acting insulin and now glipizide added at last visit, with still poor control.  She was on mealtime insulin in the past but she stopped that she could not afford it.  She had very high blood sugars in the past, after she stopped insulin, to the 600s and she also lost weight and feeling poorly.  HbA1c increased to 15% but then after she had insurance again she was able to start NPH with improvement of her sugars since she also improved her diet.  However, after the coronavirus pandemic started, she relaxed her diet and sugars were higher at last visit.  We added the glipizide tablet before a large dinner.  We also discussed about improving diet by stopping chips at night.  She did, however, she stopped walking.  Since last visit, sugars worsened. -At this visit, sugars are fluctuating,  between values that are at target (rarely) to values in the upper 200s.  She is trying to watch her diet but she stopped exercise and she knows that this is a large impact on her sugars.  She plans to restart this.  At this visit, I also advised  her to increase her NPH and also use glipizide before every dinner with a higher dose before a large dinner. -She will become permanent at her job in a months.  At that time, she has the right to apply for medical insurance (she does not have any now).  After this, I am hoping that we will be able to use a GLP-1 receptor agonist for her or an SGLT2 inhibitor.  I asked her to bring the formulary catalog for her insurance when she comes back for next visit. - I suggested to:  Patient Instructions  Please continue: - Metformin 1000 mg 2x a day  Please increase: - NPH 20 units in a.m. and 15 (may need 20 units) units at bedtime - Glipizide 5-10 mg before dinner  Please return in 3-4 months with your sugar log.   - we checked her HbA1c: 9.7% (higher) - advised to check sugars at different times of the day - 2-3x a day, rotating check times - advised for yearly eye exams >> she is UTD - return to clinic in 3-4 months  2. HL -Reviewed latest lipid panel from 02/2019: All fractions at goal Lab Results  Component Value Date   CHOL 142 02/15/2019   HDL 43 02/15/2019   LDLCALC 78 02/15/2019   TRIG 107 02/15/2019   CHOLHDL 3.3 02/15/2019  -Continues the statin without side effects  3.  Obesity class I -She gained 18 pounds before our visit in 01/2019 -Afterwards, weight was stable, now she gained 6 pounds from last visit -She will start walking again -I am hoping that the next visit, after she gets insurance, we can start an SGLT debrider GLP-1 receptor agonist, which should also help with weight loss  Philemon Kingdom, MD PhD [facility]

## 2019-12-11 ENCOUNTER — Other Ambulatory Visit: Payer: Self-pay | Admitting: Internal Medicine

## 2019-12-27 ENCOUNTER — Other Ambulatory Visit: Payer: Self-pay | Admitting: Internal Medicine

## 2020-03-09 ENCOUNTER — Ambulatory Visit: Payer: Self-pay | Admitting: Internal Medicine

## 2020-04-14 ENCOUNTER — Other Ambulatory Visit: Payer: Self-pay

## 2020-04-14 ENCOUNTER — Ambulatory Visit (HOSPITAL_COMMUNITY)
Admission: RE | Admit: 2020-04-14 | Discharge: 2020-04-14 | Disposition: A | Discharge status: Home / Self Care | Payer: Self-pay | Source: Ambulatory Visit | Attending: Cardiology | Admitting: Cardiology

## 2020-04-14 ENCOUNTER — Encounter (HOSPITAL_COMMUNITY): Payer: Self-pay | Admitting: Cardiology

## 2020-04-14 ENCOUNTER — Telehealth (HOSPITAL_COMMUNITY): Payer: Self-pay

## 2020-04-14 VITALS — BP 184/90 | HR 72 | Ht 61.0 in | Wt 183.4 lb

## 2020-04-14 DIAGNOSIS — I5022 Chronic systolic (congestive) heart failure: Secondary | ICD-10-CM

## 2020-04-14 DIAGNOSIS — Z7984 Long term (current) use of oral hypoglycemic drugs: Secondary | ICD-10-CM | POA: Insufficient documentation

## 2020-04-14 DIAGNOSIS — Z955 Presence of coronary angioplasty implant and graft: Secondary | ICD-10-CM | POA: Insufficient documentation

## 2020-04-14 DIAGNOSIS — G4733 Obstructive sleep apnea (adult) (pediatric): Secondary | ICD-10-CM | POA: Insufficient documentation

## 2020-04-14 DIAGNOSIS — I2510 Atherosclerotic heart disease of native coronary artery without angina pectoris: Secondary | ICD-10-CM | POA: Insufficient documentation

## 2020-04-14 DIAGNOSIS — E785 Hyperlipidemia, unspecified: Secondary | ICD-10-CM | POA: Insufficient documentation

## 2020-04-14 DIAGNOSIS — Z7982 Long term (current) use of aspirin: Secondary | ICD-10-CM | POA: Insufficient documentation

## 2020-04-14 DIAGNOSIS — N189 Chronic kidney disease, unspecified: Secondary | ICD-10-CM | POA: Insufficient documentation

## 2020-04-14 DIAGNOSIS — E1122 Type 2 diabetes mellitus with diabetic chronic kidney disease: Secondary | ICD-10-CM | POA: Insufficient documentation

## 2020-04-14 DIAGNOSIS — Z794 Long term (current) use of insulin: Secondary | ICD-10-CM | POA: Insufficient documentation

## 2020-04-14 DIAGNOSIS — I5042 Chronic combined systolic (congestive) and diastolic (congestive) heart failure: Secondary | ICD-10-CM | POA: Insufficient documentation

## 2020-04-14 DIAGNOSIS — Z79899 Other long term (current) drug therapy: Secondary | ICD-10-CM | POA: Insufficient documentation

## 2020-04-14 DIAGNOSIS — Z87891 Personal history of nicotine dependence: Secondary | ICD-10-CM | POA: Insufficient documentation

## 2020-04-14 DIAGNOSIS — I130 Hypertensive heart and chronic kidney disease with heart failure and stage 1 through stage 4 chronic kidney disease, or unspecified chronic kidney disease: Secondary | ICD-10-CM | POA: Insufficient documentation

## 2020-04-14 LAB — LIPID PANEL
Cholesterol: 236 mg/dL — ABNORMAL HIGH (ref 0–200)
HDL: 45 mg/dL (ref >40)
LDL Cholesterol: 172 mg/dL — ABNORMAL HIGH (ref 0–99)
Total CHOL/HDL Ratio: 5.2 RATIO
Triglycerides: 94 mg/dL (ref <150)
VLDL: 19 mg/dL (ref 0–40)

## 2020-04-14 LAB — BASIC METABOLIC PANEL
Anion gap: 10 (ref 5–15)
BUN: 10 mg/dL (ref 6–20)
CO2: 25 mmol/L (ref 22–32)
Calcium: 9.4 mg/dL (ref 8.9–10.3)
Chloride: 101 mmol/L (ref 98–111)
Creatinine, Ser: 1.04 mg/dL — ABNORMAL HIGH (ref 0.44–1.00)
GFR calc non Af Amer: 59 mL/min — ABNORMAL LOW (ref >60)
Glucose, Bld: 148 mg/dL — ABNORMAL HIGH (ref 70–99)
Potassium: 3.9 mmol/L (ref 3.5–5.1)
Sodium: 136 mmol/L (ref 135–145)

## 2020-04-14 MED ORDER — SPIRONOLACTONE 50 MG PO TABS: 50.0000 mg | ORAL_TABLET | Freq: Every day | ORAL | 6 refills | Status: DC

## 2020-04-14 MED ORDER — ISOSORBIDE MONONITRATE ER 60 MG PO TB24: 60.0000 mg | ORAL_TABLET | Freq: Every day | ORAL | 6 refills | Status: DC

## 2020-04-14 MED ORDER — CARVEDILOL 25 MG PO TABS: 25.0000 mg | ORAL_TABLET | Freq: Two times a day (BID) | ORAL | 6 refills | Status: DC

## 2020-04-14 MED ORDER — HYDRALAZINE HCL 25 MG PO TABS: 75.0000 mg | ORAL_TABLET | Freq: Three times a day (TID) | ORAL | 6 refills | Status: DC

## 2020-04-14 MED ORDER — FUROSEMIDE 40 MG PO TABS: 40.0000 mg | ORAL_TABLET | Freq: Every day | ORAL | 6 refills | Status: DC

## 2020-04-14 MED ORDER — ATORVASTATIN CALCIUM 80 MG PO TABS: 80.0000 mg | ORAL_TABLET | Freq: Every day | ORAL | 6 refills | Status: DC

## 2020-04-14 NOTE — Progress Notes (Signed)
Date:  04/14/2020   ID:  Misty, Miller 06-Feb-1961, MRN [id number]  Provider location: [address], [place] Type of Visit: Established patient  PCP:  Marcine Matar, MD  Cardiologist:  No primary care provider on file. Primary HF: Dr. Shirlee Latch   History of Present Illness: Misty Miller is a 59 y.o. female who has poorly controlled HTN and CHF.  She has had HTN and diabetes for years.  She was admitted to [facility] in 2/16 with a hypertensive emergency and flash pulmonary edema. She had been off irbesartan for months.  She was initially given IV Lasix (just one dose) and started back on irbesartan.  Creatinine rose from 0.95 at admission to 2.71.  Irbesartan and Lasix were stopped.  Echo was done, showing EF 35-40% with basal inferior akinesis and mild to moderate MR.  Troponin was mildly elevated to peak 0.35.  Due to elevated creatinine, she did not have cardiac cath initially.  Lexiscan Cardiolite was done, showing EF 35% but no ischemia or infarction.  V/Q scan was normal.  BP was controlled and she was sent home. In 6/16, she finally had cardiac cath showing severe RCA stenosis that was treated with DES. Echo in 5/17 showed improvement in EF back to 55%, echo in 5/18 with EF 55-60%.   She returns for followup of CHF and HTN.  BP is high today.  She has been out of all her meds for about 2 wks.  SBP was running in the 130s when she was on her meds.  She denies exertional dyspnea or chest pain.  No orthopnea/PND. No palpitations.  She uses CPAP occasionally but not regularly.    Labs (2/16): K 4.3, creatinine 0.95 => 2.71 => 1.78, TnI 0.35, Hgb 10, LDL 149, HCT 31, TSH normal, plasma aldosterone 1, urine catecholeamines normal.  Labs (3/16): K 4, creatinine 1.11 Labs (4/16): K 3.9, creatinine 1.09 Labs (12/24/14): K 4.1, creatinine 1.08, P2Y12 261 Labs 02/23/2015: K 3.6 Creatinine 0.90, HCT 35.4, BNP 18 Labs (10/16): K 4.1, creatinine 1.03, BNP 14 Labs (2/18):  Creatinine 1.09, K 4.1 Labs (7/18): K 4, creatinine 1.15 Labs (8/19): LDL 89, K 3.6, creatinine 8.31 Labs (2/20): LDL 85, HDL 48, K 3.8, creatinine 5.17 Labs (8/20): LDL 78, HDL 43 Labs (9/20): K 4.2, creatinine 1.09  PMH: 1. HTN: For > 18 yrs, poor control historically. Renal artery dopplers showed no renal artery stenosis (2/16).   2. Type II diabetes. 3. 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  Admitted 2/16 with hypertensive emergency and pulmonary edema.  Echo (2/16) with EF 35-40%, basal inferior akinesis, mild-moderate MR.  Lexiscan Cardiolite (2/16) with EF 35%, no ischemia or infarction but LHC showed severe RCA disease, as below.    - Echo (5/17): EF 55%, mild LVH.  - Echo (5/18): EF 55-60%, mild MR 4. Hyperlipidemia 5. AKI/CKD: AKI in 2/16 in the setting of ARB and diuresis.  6. CAD: LHC (6/16) with 90% proximal RCA, 50% mRCA, 60% dRCA.  Promus DES to proximal RCA.  7. OSA: Moderate, using CPAP. 8. Inadequate platelet inhibition with Plavix.  9. Palpitations: Holter in 4/16 showed primarily NSR, rare PVCs, with 10 beat run of AIVR.  Current Outpatient Medications  Medication Sig Dispense Refill  . aspirin EC 81 MG EC tablet Take 1 tablet (81 mg total) by mouth daily.    Marland Kitchen atorvastatin (LIPITOR) 80 MG tablet Take 1 tablet (80 mg total)  by mouth daily at 6 PM. 30 tablet 6  . carvedilol (COREG) 25 MG tablet Take 1 tablet (25 mg total) by mouth 2 (two) times daily with a meal. 60 tablet 6  . furosemide (LASIX) 40 MG tablet Take 1 tablet (40 mg total) by mouth daily. 30 tablet 6  . glipiZIDE (GLUCOTROL) 5 MG tablet Take 1-2 tablets (5-10 mg total) by mouth daily before supper. 90 tablet 3  . glucose blood test strip Use 2x a day - verio 200 each 3  . hydrALAZINE (APRESOLINE) 25 MG tablet Take 3 tablets (75 mg total) by mouth 3 (three) times daily. 135 tablet 6  . isosorbide mononitrate (IMDUR) 60 MG 24 hr tablet Take 1 tablet (60 mg total) by  mouth daily. 30 tablet 6  . metFORMIN (GLUCOPHAGE) 1000 MG tablet Take 1 tablet (1,000 mg total) by mouth 2 (two) times daily with a meal. 180 tablet 3  . NOVOLIN N RELION 100 UNIT/ML injection INJECT 15 UNITS IN THE MORNING AND 10 UNITS AT BEDTIME UNDER THE SKIN 10 mL 0  . OneTouch Delica Lancets 33G MISC Use 2x a day 200 each 3  . spironolactone (ALDACTONE) 50 MG tablet Take 1 tablet (50 mg total) by mouth daily. 30 tablet 6   No current facility-administered medications for this encounter.    Allergies:   Patient has no known allergies.   Social History:  The patient  reports that she quit smoking about 21 years ago. Her smoking use included cigarettes. She has a 5.00 pack-year smoking history. She has never used smokeless tobacco. She reports current alcohol use. She reports that she does not use drugs.   Family History:  The patient's family history includes Cancer in her mother.   ROS:  Please see the history of present illness.   All other systems are personally reviewed and negative.   Exam:   BP (!) 184/90 (BP Location: Left Arm, Patient Position: Sitting, Cuff Size: Normal)   Pulse 72   Ht 5\' 1"  (1.549 m)   Wt 83.2 kg (183 lb 6.4 oz)   LMP  (LMP Unknown)   SpO2 98%   BMI 34.65 kg/m  General: NAD Neck: No JVD, no thyromegaly or thyroid nodule.  Lungs: Clear to auscultation bilaterally with normal respiratory effort. CV: Nondisplaced PMI.  Heart regular S1/S2, no S3/S4, no murmur.  No peripheral edema.  No carotid bruit.  Normal pedal pulses.  Abdomen: Soft, nontender, no hepatosplenomegaly, no distention.  Skin: Intact without lesions or rashes.  Neurologic: Alert and oriented x 3.  Psych: Normal affect. Extremities: No clubbing or cyanosis.  HEENT: Normal.     Recent Labs: 04/14/2020: BUN 10; Creatinine, Ser 1.04; Potassium 3.9; Sodium 136  Personally reviewed   Wt Readings from Last 3 Encounters:  04/14/20 83.2 kg (183 lb 6.4 oz)  10/31/19 83 kg (183 lb)    05/27/19 80.3 kg (177 lb)     ASSESSMENT AND PLAN:  1. Chronic systolic => diastolic CHF: EF 05/29/19 by echo 08/17/14.  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  HTN has been treated and she is s/p DES to RCA, and EF on 5/17 echo was improved back to 55%.  Echo in 5/18 was similar with EF 55-60%. She is not volume overloaded on exam.    - Restart Coreg 25 mg bid.   - Restart spironolactone 50 mg daily. BMET today and again in 10 days.    - Restart hydralazine to 75 mg tid and Imdur  to 60 mg daily.  - Restart Lasix 40 mg daily.  - I would like to have her obtain a repeat echo to make sure that EF remains normal.  2. CAD: LHC 12/23/14 showed severe proximal RCA stenosis treated with Promus DES.  She was a Plavix non-responder so was transitioned to Brilinta 90 mg bid => now off Brilinta.  No chest pain.  - Continue ASA 81.   - Restart atorvastatin and check lipids.  3. HTN: She had marked AKI with ARB use in the past.  Restart her meds as above.  4. CKD: AKI in past after starting ARB, but fully recovered.  No evidence for renal artery stenosis on renal artery dopplers.  Suspect some baseline renal damage from long-standing HTN and diabetes but recent creatinine within normal range.    5. OSA: Asked her to be more regularly with her CPAP.  6. Diabetes: She has followup with endocrinology.    Followup in 3 wks with HF pharmacist to assess BP control when back on her meds.  Followup with NP/PA in 2 months.   Signed, [name], MD  04/14/2020  [facility] [address] [facility] [place] [address] [phone] (office) [phone] (fax)

## 2020-04-14 NOTE — Telephone Encounter (Signed)
Samara Snide, RN  04/14/2020 5:12 PM EDT Back to Top    Patient advised and made aware. Pt will restart Atorvastatin. Lab orders entered

## 2020-04-14 NOTE — Telephone Encounter (Signed)
-----   Message from Laurey Morale, MD sent at 04/14/2020  4:43 PM EDT ----- She was out of atorvastatin.  Would restart atorvastatin 80 mg daily and recheck lipids/LFTs in 2 months.

## 2020-04-14 NOTE — Patient Instructions (Addendum)
No medication changes today!  Refills have been sent to your pharmacy for all heart medications  Labs today We will only contact you if something comes back abnormal or we need to make some changes. Otherwise no news is good news!  Repeat labs in 10 days  Your physician has requested that you have an echocardiogram. Echocardiography is a painless test that uses sound waves to create images of your heart. It provides your doctor with information about the size and shape of your heart and how well your heart's chambers and valves are working. This procedure takes approximately one hour. There are no restrictions for this test.  Your physician recommends that you schedule a follow-up appointment in:3 weeks with the pharmacist AND 2 months with the Nurse practitioner/Physician Assistant  Please call office at [phone] option 2 if you have any questions or concerns.   At the [facility], you and your health needs are our priority. As part of our continuing mission to provide you with exceptional heart care, we have created designated Provider Care Teams. These Care Teams include your primary Cardiologist (physician) and Advanced Practice Providers (APPs- Physician Assistants and Nurse Practitioners) who all work together to provide you with the care you need, when you need it.   You may see any of the following providers on your designated Care Team at your next follow up: Marland Kitchen Dr Arvilla Meres . Dr [name] . Tonye Becket, NP . Robbie Lis, PA . Karle Plumber, PharmD   Please be sure to bring in all your medications bottles to every appointment.

## 2020-04-24 ENCOUNTER — Ambulatory Visit (HOSPITAL_COMMUNITY)
Admission: RE | Admit: 2020-04-24 | Discharge: 2020-04-24 | Disposition: A | Discharge status: Home / Self Care | Payer: Self-pay | Source: Ambulatory Visit | Attending: Internal Medicine | Admitting: Internal Medicine

## 2020-04-24 ENCOUNTER — Other Ambulatory Visit: Payer: Self-pay

## 2020-04-24 DIAGNOSIS — I5022 Chronic systolic (congestive) heart failure: Secondary | ICD-10-CM | POA: Insufficient documentation

## 2020-04-24 LAB — BASIC METABOLIC PANEL
Anion gap: 8 (ref 5–15)
BUN: 13 mg/dL (ref 6–20)
CO2: 26 mmol/L (ref 22–32)
Calcium: 9.4 mg/dL (ref 8.9–10.3)
Chloride: 100 mmol/L (ref 98–111)
Creatinine, Ser: 1.19 mg/dL — ABNORMAL HIGH (ref 0.44–1.00)
GFR, Estimated: 50 mL/min — ABNORMAL LOW (ref >60)
Glucose, Bld: 294 mg/dL — ABNORMAL HIGH (ref 70–99)
Potassium: 4.4 mmol/L (ref 3.5–5.1)
Sodium: 134 mmol/L — ABNORMAL LOW (ref 135–145)

## 2020-05-07 ENCOUNTER — Inpatient Hospital Stay (HOSPITAL_COMMUNITY): Admission: RE | Admit: 2020-05-07 | Payer: Self-pay | Source: Ambulatory Visit

## 2020-05-14 ENCOUNTER — Other Ambulatory Visit: Payer: Self-pay | Admitting: Internal Medicine

## 2020-05-19 ENCOUNTER — Ambulatory Visit: Payer: Self-pay | Admitting: Internal Medicine

## 2020-06-02 ENCOUNTER — Inpatient Hospital Stay (HOSPITAL_COMMUNITY): Admission: RE | Admit: 2020-06-02 | Payer: Self-pay | Source: Ambulatory Visit

## 2020-06-15 ENCOUNTER — Other Ambulatory Visit: Payer: Self-pay

## 2020-06-15 ENCOUNTER — Encounter (HOSPITAL_COMMUNITY): Payer: Self-pay

## 2020-06-15 ENCOUNTER — Ambulatory Visit (HOSPITAL_COMMUNITY)
Admission: RE | Admit: 2020-06-15 | Discharge: 2020-06-15 | Disposition: A | Discharge status: Home / Self Care | Payer: 59 | Source: Ambulatory Visit | Attending: Cardiology | Admitting: Cardiology

## 2020-06-15 VITALS — BP 147/87 | HR 64 | Wt 182.0 lb

## 2020-06-15 DIAGNOSIS — I130 Hypertensive heart and chronic kidney disease with heart failure and stage 1 through stage 4 chronic kidney disease, or unspecified chronic kidney disease: Secondary | ICD-10-CM | POA: Insufficient documentation

## 2020-06-15 DIAGNOSIS — Z87891 Personal history of nicotine dependence: Secondary | ICD-10-CM | POA: Insufficient documentation

## 2020-06-15 DIAGNOSIS — Z7982 Long term (current) use of aspirin: Secondary | ICD-10-CM | POA: Insufficient documentation

## 2020-06-15 DIAGNOSIS — I5022 Chronic systolic (congestive) heart failure: Secondary | ICD-10-CM | POA: Diagnosis present

## 2020-06-15 DIAGNOSIS — Z955 Presence of coronary angioplasty implant and graft: Secondary | ICD-10-CM | POA: Insufficient documentation

## 2020-06-15 DIAGNOSIS — Z79899 Other long term (current) drug therapy: Secondary | ICD-10-CM | POA: Diagnosis not present

## 2020-06-15 DIAGNOSIS — I2510 Atherosclerotic heart disease of native coronary artery without angina pectoris: Secondary | ICD-10-CM | POA: Insufficient documentation

## 2020-06-15 DIAGNOSIS — N189 Chronic kidney disease, unspecified: Secondary | ICD-10-CM | POA: Diagnosis not present

## 2020-06-15 DIAGNOSIS — Z7984 Long term (current) use of oral hypoglycemic drugs: Secondary | ICD-10-CM | POA: Diagnosis not present

## 2020-06-15 DIAGNOSIS — E785 Hyperlipidemia, unspecified: Secondary | ICD-10-CM | POA: Insufficient documentation

## 2020-06-15 DIAGNOSIS — G4733 Obstructive sleep apnea (adult) (pediatric): Secondary | ICD-10-CM | POA: Diagnosis not present

## 2020-06-15 DIAGNOSIS — E1122 Type 2 diabetes mellitus with diabetic chronic kidney disease: Secondary | ICD-10-CM | POA: Diagnosis not present

## 2020-06-15 DIAGNOSIS — I10 Essential (primary) hypertension: Secondary | ICD-10-CM

## 2020-06-15 DIAGNOSIS — I5042 Chronic combined systolic (congestive) and diastolic (congestive) heart failure: Secondary | ICD-10-CM

## 2020-06-15 MED ORDER — HYDRALAZINE HCL 100 MG PO TABS
100.0000 mg | ORAL_TABLET | Freq: Three times a day (TID) | ORAL | 3 refills | Status: DC
Start: 2020-06-15 — End: 2021-01-27

## 2020-06-15 NOTE — Progress Notes (Signed)
Date:  06/15/2020   ID:  Misty, Miller 08-Dec-1960, MRN [id number]  Provider location: [address], [place] Type of Visit: Established patient  PCP:  Marcine Matar, MD  Cardiologist:  No primary care provider on file. Primary HF: Dr. Shirlee Latch   History of Present Illness: Misty Miller is a 59 y.o. female who has poorly controlled HTN and CHF.  She has had HTN and diabetes for years.  She was admitted to [facility] in 2/16 with a hypertensive emergency and flash pulmonary edema. She had been off irbesartan for months.  She was initially given IV Lasix (just one dose) and started back on irbesartan.  Creatinine rose from 0.95 at admission to 2.71.  Irbesartan and Lasix were stopped.  Echo was done, showing EF 35-40% with basal inferior akinesis and mild to moderate MR.  Troponin was mildly elevated to peak 0.35.  Due to elevated creatinine, she did not have cardiac cath initially.  Lexiscan Cardiolite was done, showing EF 35% but no ischemia or infarction.  V/Q scan was normal.  BP was controlled and she was sent home. In 6/16, she finally had cardiac cath showing severe RCA stenosis that was treated with DES. Echo in 5/17 showed improvement in EF back to 55%, echo in 5/18 with EF 55-60%.   Had recent f/u w/ Dr. Shirlee Latch 04/14/20 and BP was elevated, after running out of meds x 2 weeks. She denied CP and dyspnea. Admitted to poor compliance w/ CPAP. BMP showed normal SCr and K. Lipid panel showed elevated LDL at 172. She was restarted on Coreg, spiro, hydral, lasix as well as high dose Lipitor. She was ordered to f/u w/ PharmD 2 weeks later for f/u for BP and further med titration if needed but canceled the appt. She was also ordered to get repeat 2D echo but she has not yet scheduled this.    She now presents back for 2 month f/u. Feels better. Denies dyspnea. No CP. Back on meds and reports full compliance. BP better but remains mildly elevated today at 147/87. Took am  meds already but attributes higher reading to recent position change and work and more stressful job duties. Checks BP at home and usually in the 130s systolic. Denies wt gain. No LEE. Still not fully compliant w/ CPAP.      Labs (2/16): K 4.3, creatinine 0.95 => 2.71 => 1.78, TnI 0.35, Hgb 10, LDL 149, HCT 31, TSH normal, plasma aldosterone 1, urine catecholeamines normal.  Labs (3/16): K 4, creatinine 1.11 Labs (4/16): K 3.9, creatinine 1.09 Labs (12/24/14): K 4.1, creatinine 1.08, P2Y12 261 Labs 02/23/2015: K 3.6 Creatinine 0.90, HCT 35.4, BNP 18 Labs (10/16): K 4.1, creatinine 1.03, BNP 14 Labs (2/18): Creatinine 1.09, K 4.1 Labs (7/18): K 4, creatinine 1.15 Labs (8/19): LDL 89, K 3.6, creatinine 0.32 Labs (2/20): LDL 85, HDL 48, K 3.8, creatinine 1.22 Labs (8/20): LDL 78, HDL 43 Labs (9/20): K 4.2, creatinine 1.09 Labs (10/21): K 3.9, creatinine 1.19, LDL 172   PMH: 1. HTN: For > 18 yrs, poor control historically. Renal artery dopplers showed no renal artery stenosis (2/16).   2. Type II diabetes. 3. 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  Admitted 2/16 with hypertensive emergency and pulmonary edema.  Echo (2/16) with EF 35-40%, basal inferior akinesis, mild-moderate MR.  Lexiscan Cardiolite (2/16) with EF 35%, no ischemia or infarction but LHC showed severe RCA disease, as below.    -  Echo (5/17): EF 55%, mild LVH.  - Echo (5/18): EF 55-60%, mild MR 4. Hyperlipidemia 5. AKI/CKD: AKI in 2/16 in the setting of ARB and diuresis.  6. CAD: LHC (6/16) with 90% proximal RCA, 50% mRCA, 60% dRCA.  Promus DES to proximal RCA.  7. OSA: Moderate, using CPAP. 8. Inadequate platelet inhibition with Plavix.  9. Palpitations: Holter in 4/16 showed primarily NSR, rare PVCs, with 10 beat run of AIVR.  Current Outpatient Medications  Medication Sig Dispense Refill  . aspirin EC 81 MG EC tablet Take 1 tablet (81 mg total) by mouth daily.    Marland Kitchen atorvastatin  (LIPITOR) 80 MG tablet Take 1 tablet (80 mg total) by mouth daily at 6 PM. 30 tablet 6  . carvedilol (COREG) 25 MG tablet Take 1 tablet (25 mg total) by mouth 2 (two) times daily with a meal. 60 tablet 6  . furosemide (LASIX) 40 MG tablet Take 1 tablet (40 mg total) by mouth daily. 30 tablet 6  . glipiZIDE (GLUCOTROL) 5 MG tablet Take 1-2 tablets (5-10 mg total) by mouth daily before supper. 90 tablet 3  . glucose blood test strip Use 2x a day - verio 200 each 3  . hydrALAZINE (APRESOLINE) 25 MG tablet Take 3 tablets (75 mg total) by mouth 3 (three) times daily. 135 tablet 6  . isosorbide mononitrate (IMDUR) 60 MG 24 hr tablet Take 1 tablet (60 mg total) by mouth daily. 30 tablet 6  . NOVOLIN N RELION 100 UNIT/ML injection INJECT 15 UNITS IN THE MORNING AND 10 UNITS AT BEDTIME UNDER THE SKIN 10 mL 0  . OneTouch Delica Lancets 33G MISC Use 2x a day 200 each 3  . spironolactone (ALDACTONE) 50 MG tablet Take 1 tablet (50 mg total) by mouth daily. 30 tablet 6   No current facility-administered medications for this encounter.    Allergies:   Patient has no known allergies.   Social History:  The patient  reports that she quit smoking about 21 years ago. Her smoking use included cigarettes. She has a 5.00 pack-year smoking history. She has never used smokeless tobacco. She reports current alcohol use. She reports that she does not use drugs.   Family History:  The patient's family history includes Cancer in her mother.   ROS:  Please see the history of present illness.   All other systems are personally reviewed and negative.   Exam:   BP (!) 147/87   Pulse 64   Wt 82.6 kg (182 lb)   LMP  (LMP Unknown)   SpO2 100%   BMI 34.39 kg/m  General:  Well appearing. No respiratory difficulty HEENT: normal Neck: supple. no JVD. Carotids 2+ bilat; no bruits. No lymphadenopathy or thyromegaly appreciated. Cor: PMI nondisplaced. Regular rate & rhythm. No rubs, gallops or murmurs. Lungs: clear  Abdomen: soft, nontender, nondistended. No hepatosplenomegaly. No bruits or masses. Good bowel sounds. Extremities: no cyanosis, clubbing, rash, edema Neuro: alert & oriented x 3, cranial nerves grossly intact. moves all 4 extremities w/o difficulty. Affect pleasant.     Recent Labs: 04/24/2020: BUN 13; Creatinine, Ser 1.19; Potassium 4.4; Sodium 134  Personally reviewed   Wt Readings from Last 3 Encounters:  06/15/20 82.6 kg (182 lb)  04/14/20 83.2 kg (183 lb 6.4 oz)  10/31/19 83 kg (183 lb)     ASSESSMENT AND PLAN:  1. Chronic systolic => diastolic CHF: EF 70-26% by echo 08/17/14.  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  HTN has been treated  and she is s/p DES to RCA, and EF on 5/17 echo was improved back to 55%.  Echo in 5/18 was similar with EF 55-60%. She is not volume overloaded on exam.  NYHA Class II. BMP mildly elevated in 140s systolic.  - Continue Coreg 25 mg bid.   - Continue spironolactone 50 mg daily.   - Increase hydralazine to 100 mg tid and cont Imdur 60 mg daily.  - Continue Lasix 40 mg daily.  - Plan repeat echo to make sure that EF is still normal.  2. CAD: LHC 12/23/14 showed severe proximal RCA stenosis treated with Promus DES.  She was a Plavix non-responder so was transitioned to Brilinta 90 mg bid => now off Brilinta.   - stable w/o CP  - Continue ASA 81 + Coreg 25 + Lipitor 80. LDL goal < 70  - repeat Lipid panel and HFTs today  3. HTN: mildly elevated - increase hydralazine to 100 mg tid and continue other meds per above  4. CKD: AKI in past after starting ARB, but fully recovered.  No evidence for renal artery stenosis on renal artery dopplers.  Suspect some baseline renal damage from long-standing HTN and diabetes but recent creatinine within normal range. - repeat BMP today     5. OSA: admits to poor compliance w/ CPAP. Encouraged to use nightly  6. Diabetes: She has followup with endocrinology.   7. HLD:: Goal in setting of CAD <  70. Recent lipid panel 10/21 w/ elevated LDL at 172 after running out of statin. Now back on Lipitor 80. - repeat FLP and HFTs today  - if not at goal, add Zetia +/- [id number]    F/u in 4 months w/ Dr. Shirlee Latch or sooner if drop in EF of if symptoms return.   Signed, Robbie Lis, PA-C  06/15/2020  [facility] [address] [facility] [place] [address] [phone] (office) [phone] (fax)

## 2020-06-15 NOTE — Patient Instructions (Signed)
INCREASE Hydralazine 100mg  (1 tablet) 3 times a day  Labs done today, your results will be available in MyChart, we will contact you for abnormal readings.  Your physician has requested that you have an echocardiogram. Echocardiography is a painless test that uses sound waves to create images of your heart. It provides your doctor with information about the size and shape of your heart and how well your heart's chambers and valves are working. This procedure takes approximately one hour. There are no restrictions for this procedure.   Your physician recommends that you schedule a follow-up appointment in: 4 months  If you have any questions or concerns before your next appointment please send a message through [facility] or call our office at [phone].    TO LEAVE A MESSAGE FOR THE NURSE SELECT OPTION 2, PLEASE LEAVE A MESSAGE INCLUDING: . YOUR NAME . DATE OF BIRTH . CALL BACK NUMBER . REASON FOR CALL**this is important as we prioritize the call backs  YOU WILL RECEIVE A CALL BACK THE SAME DAY AS LONG AS YOU CALL BEFORE 4:00 PM

## 2020-06-18 ENCOUNTER — Ambulatory Visit: Payer: Self-pay | Admitting: Internal Medicine

## 2020-06-19 ENCOUNTER — Ambulatory Visit (HOSPITAL_COMMUNITY): Payer: Self-pay

## 2020-06-29 ENCOUNTER — Other Ambulatory Visit: Payer: Self-pay

## 2020-06-29 ENCOUNTER — Other Ambulatory Visit (HOSPITAL_COMMUNITY): Payer: Self-pay | Admitting: Cardiology

## 2020-06-29 ENCOUNTER — Ambulatory Visit (HOSPITAL_COMMUNITY)
Admission: RE | Admit: 2020-06-29 | Discharge: 2020-06-29 | Disposition: A | Discharge status: Home / Self Care | Payer: 59 | Source: Ambulatory Visit | Attending: Cardiology | Admitting: Cardiology

## 2020-06-29 DIAGNOSIS — E1122 Type 2 diabetes mellitus with diabetic chronic kidney disease: Secondary | ICD-10-CM | POA: Diagnosis not present

## 2020-06-29 DIAGNOSIS — I340 Nonrheumatic mitral (valve) insufficiency: Secondary | ICD-10-CM | POA: Insufficient documentation

## 2020-06-29 DIAGNOSIS — E785 Hyperlipidemia, unspecified: Secondary | ICD-10-CM | POA: Insufficient documentation

## 2020-06-29 DIAGNOSIS — G4730 Sleep apnea, unspecified: Secondary | ICD-10-CM | POA: Diagnosis not present

## 2020-06-29 DIAGNOSIS — I130 Hypertensive heart and chronic kidney disease with heart failure and stage 1 through stage 4 chronic kidney disease, or unspecified chronic kidney disease: Secondary | ICD-10-CM | POA: Insufficient documentation

## 2020-06-29 DIAGNOSIS — I5022 Chronic systolic (congestive) heart failure: Secondary | ICD-10-CM | POA: Diagnosis not present

## 2020-06-29 DIAGNOSIS — N189 Chronic kidney disease, unspecified: Secondary | ICD-10-CM | POA: Insufficient documentation

## 2020-06-29 LAB — ECHOCARDIOGRAM COMPLETE
AR max vel: 1.96 cm2
AV Area VTI: 2.15 cm2
AV Area mean vel: 2.04 cm2
AV Mean grad: 2 mmHg
AV Peak grad: 4.5 mmHg
Ao pk vel: 1.06 m/s
Area-P 1/2: 3.08 cm2
Calc EF: 61.3 %
S' Lateral: 2.9 cm
Single Plane A2C EF: 62.7 %
Single Plane A4C EF: 60.5 %

## 2020-06-29 MED ORDER — SPIRONOLACTONE 50 MG PO TABS
50.0000 mg | ORAL_TABLET | Freq: Every day | ORAL | 6 refills | Status: DC
Start: 2020-06-29 — End: 2021-01-27

## 2020-06-29 NOTE — Progress Notes (Signed)
  Echocardiogram 2D Echocardiogram has been performed.  Gerda Diss 06/29/2020, 8:34 AM

## 2020-07-02 ENCOUNTER — Encounter (HOSPITAL_COMMUNITY): Payer: Self-pay

## 2020-08-14 ENCOUNTER — Ambulatory Visit: Payer: Self-pay | Admitting: Internal Medicine

## 2020-08-31 ENCOUNTER — Other Ambulatory Visit: Payer: Self-pay | Admitting: Internal Medicine

## 2020-09-15 ENCOUNTER — Other Ambulatory Visit: Payer: Self-pay | Admitting: Internal Medicine

## 2020-10-02 ENCOUNTER — Encounter (HOSPITAL_COMMUNITY): Payer: Self-pay | Admitting: Cardiology

## 2020-10-25 ENCOUNTER — Emergency Department (HOSPITAL_COMMUNITY)
Admission: EM | Admit: 2020-10-25 | Discharge: 2020-10-25 | Disposition: A | Discharge status: Home / Self Care | Payer: 59 | Attending: Emergency Medicine | Admitting: Emergency Medicine

## 2020-10-25 ENCOUNTER — Other Ambulatory Visit: Payer: Self-pay

## 2020-10-25 ENCOUNTER — Emergency Department (HOSPITAL_COMMUNITY): Payer: 59

## 2020-10-25 DIAGNOSIS — I5022 Chronic systolic (congestive) heart failure: Secondary | ICD-10-CM | POA: Diagnosis not present

## 2020-10-25 DIAGNOSIS — K8020 Calculus of gallbladder without cholecystitis without obstruction: Secondary | ICD-10-CM

## 2020-10-25 DIAGNOSIS — N189 Chronic kidney disease, unspecified: Secondary | ICD-10-CM | POA: Diagnosis not present

## 2020-10-25 DIAGNOSIS — Z7984 Long term (current) use of oral hypoglycemic drugs: Secondary | ICD-10-CM | POA: Insufficient documentation

## 2020-10-25 DIAGNOSIS — K469 Unspecified abdominal hernia without obstruction or gangrene: Secondary | ICD-10-CM | POA: Diagnosis not present

## 2020-10-25 DIAGNOSIS — E1122 Type 2 diabetes mellitus with diabetic chronic kidney disease: Secondary | ICD-10-CM | POA: Diagnosis not present

## 2020-10-25 DIAGNOSIS — R109 Unspecified abdominal pain: Secondary | ICD-10-CM | POA: Diagnosis present

## 2020-10-25 DIAGNOSIS — I130 Hypertensive heart and chronic kidney disease with heart failure and stage 1 through stage 4 chronic kidney disease, or unspecified chronic kidney disease: Secondary | ICD-10-CM | POA: Diagnosis not present

## 2020-10-25 DIAGNOSIS — I2510 Atherosclerotic heart disease of native coronary artery without angina pectoris: Secondary | ICD-10-CM | POA: Insufficient documentation

## 2020-10-25 DIAGNOSIS — Z794 Long term (current) use of insulin: Secondary | ICD-10-CM | POA: Insufficient documentation

## 2020-10-25 DIAGNOSIS — Z79899 Other long term (current) drug therapy: Secondary | ICD-10-CM | POA: Insufficient documentation

## 2020-10-25 DIAGNOSIS — Z7982 Long term (current) use of aspirin: Secondary | ICD-10-CM | POA: Diagnosis not present

## 2020-10-25 DIAGNOSIS — Z87891 Personal history of nicotine dependence: Secondary | ICD-10-CM | POA: Insufficient documentation

## 2020-10-25 LAB — COMPREHENSIVE METABOLIC PANEL
ALT: 11 U/L (ref 0–44)
AST: 14 U/L — ABNORMAL LOW (ref 15–41)
Albumin: 3.9 g/dL (ref 3.5–5.0)
Alkaline Phosphatase: 87 U/L (ref 38–126)
Anion gap: 10 (ref 5–15)
BUN: 18 mg/dL (ref 6–20)
CO2: 29 mmol/L (ref 22–32)
Calcium: 9.6 mg/dL (ref 8.9–10.3)
Chloride: 97 mmol/L — ABNORMAL LOW (ref 98–111)
Creatinine, Ser: 1.14 mg/dL — ABNORMAL HIGH (ref 0.44–1.00)
GFR, Estimated: 55 mL/min — ABNORMAL LOW (ref >60)
Glucose, Bld: 309 mg/dL — ABNORMAL HIGH (ref 70–99)
Potassium: 4.4 mmol/L (ref 3.5–5.1)
Sodium: 136 mmol/L (ref 135–145)
Total Bilirubin: 0.9 mg/dL (ref 0.3–1.2)
Total Protein: 7.5 g/dL (ref 6.5–8.1)

## 2020-10-25 LAB — URINALYSIS, ROUTINE W REFLEX MICROSCOPIC
Bacteria, UA: NONE SEEN
Bilirubin Urine: NEGATIVE
Glucose, UA: >=500 mg/dL — AB
Hgb urine dipstick: NEGATIVE
Ketones, ur: NEGATIVE mg/dL
Leukocytes,Ua: NEGATIVE
Nitrite: NEGATIVE
Protein, ur: NEGATIVE mg/dL
Specific Gravity, Urine: 1.014 (ref 1.005–1.030)
pH: 5 (ref 5.0–8.0)

## 2020-10-25 LAB — I-STAT BETA HCG BLOOD, ED (MC, WL, AP ONLY): I-stat hCG, quantitative: <5 m[IU]/mL (ref <5)

## 2020-10-25 LAB — CBC WITH DIFFERENTIAL/PLATELET
Abs Immature Granulocytes: 0.03 10*3/uL (ref 0.00–0.07)
Basophils Absolute: 0 10*3/uL (ref 0.0–0.1)
Basophils Relative: 0 %
Eosinophils Absolute: 0.1 10*3/uL (ref 0.0–0.5)
Eosinophils Relative: 1 %
HCT: 37.4 % (ref 36.0–46.0)
Hemoglobin: 12.5 g/dL (ref 12.0–15.0)
Immature Granulocytes: 0 %
Lymphocytes Relative: 19 %
Lymphs Abs: 1.9 10*3/uL (ref 0.7–4.0)
MCH: 29.5 pg (ref 26.0–34.0)
MCHC: 33.4 g/dL (ref 30.0–36.0)
MCV: 88.2 fL (ref 80.0–100.0)
Monocytes Absolute: 0.5 10*3/uL (ref 0.1–1.0)
Monocytes Relative: 5 %
Neutro Abs: 7.7 10*3/uL (ref 1.7–7.7)
Neutrophils Relative %: 75 %
Platelets: 266 10*3/uL (ref 150–400)
RBC: 4.24 MIL/uL (ref 3.87–5.11)
RDW: 13.2 % (ref 11.5–15.5)
WBC: 10.2 10*3/uL (ref 4.0–10.5)
nRBC: 0 % (ref 0.0–0.2)

## 2020-10-25 LAB — LIPASE, BLOOD: Lipase: 38 U/L (ref 11–51)

## 2020-10-25 LAB — TROPONIN I (HIGH SENSITIVITY): Troponin I (High Sensitivity): 5 ng/L (ref <18)

## 2020-10-25 MED ORDER — PANTOPRAZOLE SODIUM 40 MG IV SOLR
40.0000 mg | Freq: Once | INTRAVENOUS | Status: AC
  Administered 2020-10-25: 40 mg via INTRAVENOUS
  Filled 2020-10-25: qty 40

## 2020-10-25 MED ORDER — ONDANSETRON HCL 4 MG/2ML IJ SOLN
4.0000 mg | Freq: Once | INTRAMUSCULAR | Status: AC | PRN
  Administered 2020-10-25: 4 mg via INTRAVENOUS
  Filled 2020-10-25: qty 2

## 2020-10-25 MED ORDER — OMEPRAZOLE 20 MG PO CPDR: 20.0000 mg | DELAYED_RELEASE_CAPSULE | Freq: Two times a day (BID) | ORAL | 0 refills | Status: DC

## 2020-10-25 MED ORDER — ONDANSETRON 4 MG PO TBDP: 4.0000 mg | ORAL_TABLET | Freq: Three times a day (TID) | ORAL | 0 refills | Status: DC | PRN

## 2020-10-25 MED ORDER — SODIUM CHLORIDE 0.9 % IV BOLUS
500.0000 mL | Freq: Once | INTRAVENOUS | Status: AC
  Administered 2020-10-25: 500 mL via INTRAVENOUS

## 2020-10-25 NOTE — Discharge Instructions (Addendum)
Your history of upper abdominal discomfort and nausea after certain meals with gallstones seen on ultrasound is suggestive of symptomatic cholelithiasis.    I have referred you to general surgery.  Please call them to schedule appointment for ongoing evaluation and management.  I also cannot emphasize enough the importance of getting established with a primary care provider for ongoing evaluation management of her health and wellbeing.  I have prescribed you a proton pump inhibitor which I would like for you to take, as directed.  I would also like for you to take Zofran as needed for nausea symptoms.  There is also an incidental finding of a small mass on your liver measuring 1.2 cm.  This is not related to your symptoms here today.  I would like for you to obtain an MRI on outpatient basis for better characterization.  Your primary care provider should be able to order this for you.  Please return to the ED or seek immediate medical attention should you experience any new or worsening symptoms.

## 2020-10-25 NOTE — ED Triage Notes (Signed)
Pt has been n/v since 4/16. She has had upper stomach pain since last week. Pt said she feels dizzy when she has the n/v symptoms. Pt only have treated symptoms with ginger ale and crackers but unable to provide relief.

## 2020-10-25 NOTE — ED Provider Notes (Signed)
[facility] EMERGENCY DEPARTMENT Provider Note   CSN: [id number] Arrival date & time: 10/25/20  1123     History Chief Complaint  Patient presents with  . Nausea    Misty Miller is a 60 y.o. female with past medical history significant for insulin-dependent diabetes, HTN, HLD, CHF, and CAD with MI who presents the ED with complaints of upper abdominal discomfort with associated nausea and vomiting.  On my examination, patient reports that for the past couple of weeks she has been experiencing intermittent abdominal discomfort described as a "squeezing" in her upper abdomen.  She states that this is usually associated with meals.  She also will develop nausea shortly after eating and when she stands up will become unsteady with disequilibrium.  She denies any room spinning dizziness or feeling as though she might faint.    She states that she has had multiple episodes of nonbloody emesis, but has been able to continue eating and drinking, albeit intermittently limited due to her symptoms.  She states that it comes and goes and she has had some days where she has been asymptomatic.  She does not take any proton pump inhibitors and denies any history of GERD.  Abdominal surgery history notable only for C-section in 1996.  Most recent bowel movement was yesterday, no melena or BRBPR.  She denies any chest pain or shortness of breath, fevers or chills, hematemesis, changes in her bowel habits, urinary symptoms, room spinning dizziness, numbness or weakness, blurred vision, or other focal neurologic deficits.  HPI     Past Medical History:  Diagnosis Date  . CHF (congestive heart failure) (HCC) 08/16/2014  . Chronic kidney disease 08/16/2014  . Coronary artery disease    cath 12/23/2014 single vessel dx, DES to RCA, moderate mid RCA residual  . Family history of adverse reaction to anesthesia    "it's hard to wake my mom after procedure is over"  . History of blood  transfusion 1992   S/P miscarriage  . Hyperlipemia   . Hypertension   . Myocardial infarction (HCC) 08/16/2014   "mild"  . Obesity (BMI 30-39.9) 04/08/2015  . OSA (obstructive sleep apnea)    moderate with an AHI of   . Sleep apnea    "recently tested positive; no mask yet" (12/23/2014)  . Type II diabetes mellitus [facility])     Patient Active Problem List   Diagnosis Date Noted  . Poorly controlled type 2 diabetes mellitus with circulatory disorder (HCC) 10/12/2018  . Obesity (BMI 30-39.9) 04/08/2015  . OSA (obstructive sleep apnea)   . OSA on CPAP 03/05/2015  . CAD in native artery 12/23/2014  . Chest pain at rest 12/18/2014  . Chronic systolic CHF (congestive heart failure) (HCC) 09/01/2014  . CKD (chronic kidney disease) 09/01/2014  . Acute kidney injury (HCC) 08/17/2014  . Flash pulmonary edema (HCC) 08/16/2014  . CHF (congestive heart failure) (HCC) 08/16/2014  . Hypertensive emergency 08/16/2014  . HTN (hypertension) 08/16/2014  . DM (diabetes mellitus), type 2, uncontrolled (HCC) 08/16/2014  . At risk for sleep apnea 08/16/2014    Past Surgical History:  Procedure Laterality Date  . CARDIAC CATHETERIZATION N/A 12/23/2014   Procedure: Left Heart Cath and Coronary Angiography;  Surgeon: Laurey Morale, MD;  Location: [facility] INVASIVE CV LAB;  Service: Cardiovascular;  Laterality: N/A;  . CARDIAC CATHETERIZATION N/A 12/23/2014   Procedure: Intravascular Pressure Wire/FFR Study;  Surgeon: Lyn Records, MD;  Location: [facility] INVASIVE CV LAB;  Service: Cardiovascular;  Laterality: N/A;  . CARDIAC CATHETERIZATION N/A 12/23/2014   Procedure: Coronary Stent Intervention;  Surgeon: Lyn RecordsHenry W Smith, MD;  [facility] LAB;  Service: Cardiovascular;  Laterality: N/A;  . CESAREAN SECTION  06/1995  . CORONARY ANGIOPLASTY WITH STENT PLACEMENT  12/23/2014   "1"  . 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     Family History   Problem Relation Age of Onset  . Cancer Mother     Social History   Tobacco Use  . Smoking status: Former Smoker    Packs/day: 0.25    Years: 20.00    Pack years: 5.00    Types: Cigarettes    Quit date: 11/09/1998    Years since quitting: 21.9  . Smokeless tobacco: Never Used  Vaping Use  . Vaping Use: Never used  Substance Use Topics  . Alcohol use: Yes    Comment: 12/23/2014 "might have a couple mixed drinks once/month"  . Drug use: No    Home Medications Prior to Admission medications   Medication Sig Start Date End Date Taking? Authorizing Provider  omeprazole (PRILOSEC) 20 MG capsule Take 1 capsule (20 mg total) by mouth 2 (two) times daily before a meal. Take the pill 30 minutes before breakfast and dinner 10/25/20  Yes Evelena LeydenGreen, Christabell Loseke L, PA-C  ondansetron (ZOFRAN ODT) 4 MG disintegrating tablet Take 1 tablet (4 mg total) by mouth every 8 (eight) hours as needed for nausea or vomiting. 10/25/20  Yes Lorelee NewGreen, Doss Cybulski L, PA-C  aspirin EC 81 MG EC tablet Take 1 tablet (81 mg total) by mouth daily. 08/20/14   Tisovec, Adelfa Kohichard W, MD  atorvastatin (LIPITOR) 80 MG tablet Take 1 tablet (80 mg total) by mouth daily at 6 PM. 04/14/20   Laurey MoraleMcLean, Dalton S, MD  carvedilol (COREG) 25 MG tablet Take 1 tablet (25 mg total) by mouth 2 (two) times daily with a meal. 04/14/20   Laurey MoraleMcLean, Dalton S, MD  furosemide (LASIX) 40 MG tablet Take 1 tablet (40 mg total) by mouth daily. 04/14/20   Laurey MoraleMcLean, Dalton S, MD  glipiZIDE (GLUCOTROL) 5 MG tablet Take 1-2 tablets (5-10 mg total) by mouth daily before supper. 10/31/19   Carlus PavlovGherghe, Cristina, MD  glucose blood test strip Use 2x a day - verio 01/25/19   Carlus PavlovGherghe, Cristina, MD  hydrALAZINE (APRESOLINE) 100 MG tablet Take 1 tablet (100 mg total) by mouth 3 (three) times daily. 06/15/20   Robbie LisSimmons, Brittainy M, PA-C  isosorbide mononitrate (IMDUR) 60 MG 24 hr tablet Take 1 tablet (60 mg total) by mouth daily. 04/14/20   Laurey MoraleMcLean, Dalton S, MD  NOVOLIN N RELION 100 UNIT/ML  injection INJECT 15 UNITS IN THE MORNING AND 10 UNITS AT BEDTIME UNDER THE SKIN 09/15/20   Carlus PavlovGherghe, Cristina, MD  OneTouch Delica Lancets 33G MISC Use 2x a day 01/25/19   Carlus PavlovGherghe, Cristina, MD  spironolactone (ALDACTONE) 50 MG tablet Take 1 tablet (50 mg total) by mouth daily. 06/29/20   Laurey MoraleMcLean, Dalton S, MD    Allergies    Patient has no known allergies.  Review of Systems   Review of Systems  All other systems reviewed and are negative.   Physical Exam Updated Vital Signs BP 139/73   Pulse 67   Temp 97.9 F (36.6 C) (Oral)   Resp 16   Ht 5\' 2"  (1.575 m)   Wt 77.1 kg   LMP  (LMP Unknown)  SpO2 100%   BMI 31.09 kg/m   Physical Exam Vitals and nursing note reviewed. Exam conducted with a chaperone present.  Constitutional:      Appearance: Normal appearance.  HENT:     Head: Normocephalic and atraumatic.     Right Ear: Tympanic membrane, ear canal and external ear normal. There is no impacted cerumen.     Left Ear: Tympanic membrane, ear canal and external ear normal. There is no impacted cerumen.  Eyes:     General: No scleral icterus.    Conjunctiva/sclera: Conjunctivae normal.  Cardiovascular:     Rate and Rhythm: Normal rate.     Pulses: Normal pulses.  Pulmonary:     Effort: Pulmonary effort is normal. No respiratory distress.  Abdominal:     General: Abdomen is flat. There is no distension.     Palpations: Abdomen is soft.     Tenderness: There is abdominal tenderness. There is no guarding.     Hernia: A hernia is present.     Comments: Soft, nondistended.  Mild tenderness in right upper quadrant.  Negative Murphy sign.  Small hernia appreciated in epigastric region with deep palpation.  Not particularly tender.  No overlying skin changes.  No tenderness or masses noted elsewhere.  Musculoskeletal:        General: Normal range of motion.  Skin:    General: Skin is dry.  Neurological:     Mental Status: She is alert and oriented to person, place, and time.      GCS: GCS eye subscore is 4. GCS verbal subscore is 5. GCS motor subscore is 6.  Psychiatric:        Mood and Affect: Mood normal.        Behavior: Behavior normal.        Thought Content: Thought content normal.     ED Results / Procedures / Treatments   Labs (all labs ordered are listed, but only abnormal results are displayed) Labs Reviewed  COMPREHENSIVE METABOLIC PANEL - Abnormal; Notable for the following components:      Result Value   Chloride 97 (*)    Glucose, Bld 309 (*)    Creatinine, Ser 1.14 (*)    AST 14 (*)    GFR, Estimated 55 (*)    All other components within normal limits  URINALYSIS, ROUTINE W REFLEX MICROSCOPIC - Abnormal; Notable for the following components:   Glucose, UA >=500 (*)    All other components within normal limits  LIPASE, BLOOD  CBC WITH DIFFERENTIAL/PLATELET  I-STAT BETA HCG BLOOD, ED ([facility], [facility], [facility] ONLY)  TROPONIN I (HIGH SENSITIVITY)    EKG EKG Interpretation  Date/Time:  Sunday October 25 2020 11:48:40 EDT Ventricular Rate:  66 PR Interval:  155 QRS Duration: 83 QT Interval:  430 QTC Calculation: 451 R Axis:   7 Text Interpretation: Sinus rhythm Anterior infarct, old Confirmed by Norman Clay ([id number]) on 10/25/2020 2:20:54 PM   Radiology US Abdomen Limited  Result Date: 10/25/2020 CLINICAL DATA:  Right upper quadrant pain. EXAM: ULTRASOUND ABDOMEN LIMITED RIGHT UPPER QUADRANT COMPARISON:  None. FINDINGS: Gallbladder: There is a large shadowing gallstone measuring 2.5 cm. No gallbladder wall thickening. Negative sonographic Murphy sign. Common bile duct: Diameter: 0.2 cm, within normal limits. Liver: There is a small hypoechoic mass in the left hepatic lobe measuring approximately 1.2 x 0.7 x 1.0 cm. Within normal limits in parenchymal echogenicity. Portal vein is patent on color Doppler imaging with normal direction of blood flow towards the  liver. Other: None. IMPRESSION: 1.  Cholelithiasis without evidence of acute cholecystitis. 2.  Indeterminate small mass in the left hepatic lobe measuring 1.2 cm. Recommend contrast enhanced liver MRI on a nonemergent basis. Electronically Signed   By: Emmaline Kluver M.D.   On: 10/25/2020 13:39    Procedures Procedures   Medications Ordered in ED Medications  ondansetron (ZOFRAN) injection 4 mg (4 mg Intravenous Given 10/25/20 1512)  pantoprazole (PROTONIX) injection 40 mg (40 mg Intravenous Given 10/25/20 1241)  sodium chloride 0.9 % bolus 500 mL (0 mLs Intravenous Stopped 10/25/20 1330)    ED Course  I have reviewed the triage vital signs and the nursing notes.  Pertinent labs & imaging results that were available during my care of the patient were reviewed by me and considered in my medical decision making (see chart for details).    MDM Rules/Calculators/A&P                          Misty Miller was evaluated in Emergency Department on 10/25/2020 for the symptoms described in the history of present illness. She was evaluated in the context of the global COVID-19 pandemic, which necessitated consideration that the patient might be at risk for infection with the SARS-CoV-2 virus that causes COVID-19. Institutional protocols and algorithms that pertain to the evaluation of patients at risk for COVID-19 are in a state of rapid change based on information released by regulatory bodies including the CDC and federal and state organizations. These policies and algorithms were followed during the patient's care in the ED.  I personally reviewed patient's medical chart and all notes from triage and staff during today's encounter. I have also ordered and reviewed all labs and imaging that I felt to be medically necessary in the evaluation of this patient's complaints and with consideration of their physical exam. If needed, translation services were available and utilized.   Patient with complaints of upper abdominal discomfort, mostly associated with food, that is intermittent.  She  also endorses associated nausea, multiple episodes of nonbloody emesis, and disequilibrium when standing from seated position.  Negative orthostatics here in the ED.  Laboratory work-up is benign.  She does have hyperglycemia to 309, but no other significant derangement.  Lipase is within normal limits.  No leukocytosis.  She denies fevers at home and her vitals have been largely unremarkable.  Her abdominal exam is also largely benign, aside from mild right upper quadrant abdominal tenderness.  We will proceed with ultrasound of right upper quadrant, particularly given her story.    Ultrasound reveals cholelithiasis without evidence of acute cholecystitis.  There is also an indeterminate small mass in the left hepatic lobe measuring 1.2 cm.  Unclear as to whether or not this is what I palpate on my exam.  She states that she has noticed that chronically.  Recommending nonemergent MRI.  Given patient's intermittent symptoms, suspect possible symptomatic cholelithiasis.  We will refer her to general surgery on outpatient basis.  Also encouraging outpatient MRI.  Will obtain one-time troponin given that she is insulin-dependent diabetic with nausea and abdominal symptoms, particularly given that she has a history of CAD and MI.  Denies chest pain or shortness of breath.  If that is normal, reasonable for discharge.  Troponin resulted at 5.  We will refer her to general surgery given her symptomatic cholelithiasis.  I will also initiate proton pump inhibitors and provide her with a course of Zofran to take  as needed for nausea symptoms.  She has not had any lightheadedness or disequilibrium here in the ED.  She feels improved.  ED return precautions discussed.  Patient and husband at bedside voiced understanding and are agreeable to the plan.  Final Clinical Impression(s) / ED Diagnoses Final diagnoses:  Symptomatic cholelithiasis    Rx / DC Orders ED Discharge Orders         Ordered     ondansetron (ZOFRAN ODT) 4 MG disintegrating tablet  Every 8 hours PRN        10/25/20 1525    omeprazole (PRILOSEC) 20 MG capsule  2 times daily before meals        10/25/20 1525           Elvera Maria 10/25/20 1525    Cheryll Cockayne, MD 10/26/20 [phone]

## 2020-10-25 NOTE — ED Notes (Signed)
Patient transported to room from US

## 2020-10-25 NOTE — ED Notes (Signed)
Patient transported to Ultrasound in stable condition

## 2020-11-27 ENCOUNTER — Ambulatory Visit: Payer: Self-pay | Admitting: Surgery

## 2020-12-04 ENCOUNTER — Telehealth: Payer: Self-pay | Admitting: *Deleted

## 2020-12-04 NOTE — Telephone Encounter (Signed)
   [facility] Pre-operative Risk Assessment    Patient Name: Misty Miller  DOB: 1961-05-28  MRN: [id number]   HEARTCARE STAFF: - Please ensure there is not already an duplicate clearance open for this procedure. - Under Visit Info/Reason for Call, type in Other and utilize the format Clearance MM/DD/YY or Clearance TBD. Do not use dashes or single digits. - If request is for dental extraction, please clarify the # of teeth to be extracted.  Request for surgical clearance:  1. What type of surgery is being performed?  LAPAROSCOPIC CHOLECYSTECTOMY   2. When is this surgery scheduled?  TBD   3. What type of clearance is required (medical clearance vs. Pharmacy clearance to hold med vs. Both)?  BOTH  4. Are there any medications that need to be held prior to surgery and how long?  ASPIRIN  5. Practice name and name of physician performing surgery?  [facility] / DR. GERKIN   6. What is the office phone number?  [phone]   0.   What is the office fax number?  [phone]  ATTN:  KELLY 8.   Anesthesia type (None, local, MAC, general) ? GENERAL   Misty Miller 12/04/2020, 4:19 PM  _________________________________________________________________   (provider comments below)

## 2020-12-04 NOTE — Telephone Encounter (Signed)
Routed surgical clearance over to Heart Failure, pt of Dr. Shirlee Latch.

## 2020-12-04 NOTE — Telephone Encounter (Signed)
   Name: Misty Miller  DOB: March 21, 1961  MRN: [id number]  Primary Cardiologist: None  Chart reviewed as part of pre-operative protocol coverage. Because of [place] R Kalp's past medical history and time since last visit, she will require a follow-up visit in order to better assess preoperative cardiovascular risk.  Pre-op covering staff: - Please schedule appointment and call patient to inform them. If patient already had an upcoming appointment within acceptable timeframe, please add "pre-op clearance" to the appointment notes so provider is aware. - Please contact requesting surgeon's office via preferred method (i.e, phone, fax) to inform them of need for appointment prior to surgery.  If applicable, this message will also be routed to pharmacy pool and/or primary cardiologist for input on holding anticoagulant/antiplatelet agent as requested below so that this information is available to the clearing provider at time of patient's appointment.   Eula Listen, PA-C  12/04/2020, 4:27 PM

## 2020-12-06 NOTE — Telephone Encounter (Signed)
If no new symptoms, ok for surgery.

## 2020-12-08 NOTE — Telephone Encounter (Signed)
   Name: Misty Miller  DOB: 1961/01/13  MRN: [id number]   Primary Cardiologist: [name], MD   Chart reviewed as part of pre-operative protocol coverage.  Per Dr. Shirlee Latch, as long as the patient is not describing any new issues she can be cleared for surgery.  Attempted to contact the patient 12/08/20 however there was no answer. Left a voicemail for patient to call back for ongoing preop assessment.    Beatriz Stallion, PA-C 12/08/2020, 9:27 AM

## 2020-12-10 ENCOUNTER — Other Ambulatory Visit: Payer: Self-pay | Admitting: Surgery

## 2020-12-10 DIAGNOSIS — R160 Hepatomegaly, not elsewhere classified: Secondary | ICD-10-CM

## 2020-12-10 NOTE — Telephone Encounter (Signed)
   Name: Misty Miller  DOB: 1960-09-19  MRN: [id number]   Primary Cardiologist: Dr Shirlee Latch  Chart reviewed as part of pre-operative protocol coverage. Patient was contacted 12/10/2020 in reference to pre-operative risk assessment for pending surgery as outlined below.  [place] R [place] was last seen on 06/15/20 by Dr. Shirlee Latch.  Since that day, [place] Orr has done well. Tells me since taking her cardiac medications her dyspnea has resolved. Exercise tolerance >4 METS. No chest pain..  Therefore, based on ACC/AHA guidelines, the patient would be at acceptable risk for the planned procedure without further cardiovascular testing.   Given history of CAD and DES, would prefer continuation of aspirin throughout the peri procedural period however it could be held if deemed necessary by surgical team.  The patient was advised that if she develops new symptoms prior to surgery to contact our office to arrange for a follow-up visit, and she verbalized understanding.  I will route this recommendation to the requesting party via Epic fax function and remove from pre-op pool. Please call with questions.  [name], NP 12/10/2020, 1:04 PM

## 2020-12-17 NOTE — Progress Notes (Addendum)
COVID Vaccine Completed: Yes Date COVID Vaccine completed: 10/05/19 Has received booster: No COVID vaccine manufacturer: Pfizer     Date of COVID positive in last 90 days: No  PCP - N/A       Cardiologist - Dr. [name], [name], NP cardiac clearance 12/10/20 in epic  Chest x-ray - greater than 1 year in epic EKG - 10/25/20 in epic Stress Test - greater than 2 years  ECHO - 06/29/20 in epic Cardiac Cath - greater than 2 years in epic Pacemaker/ICD device last checked: N/A  Sleep Study - Yes  CPAP - No  Fasting Blood Sugar - 202 Checks Blood Sugar ___3__ times a day  Blood Thinner Instructions: N/A Aspirin Instructions:  N/A Last Dose: N/A  Activity level:  Can go up a flight of stairs and activities of daily living without stopping and without symptoms       Anesthesia review: CHF, History of MI, OSA, DM, HTN, CKD, CAD  Patient denies shortness of breath, fever, cough and chest pain at PAT appointment   Patient verbalized understanding of instructions that were given to them at the PAT appointment. Patient was also instructed that they will need to review over the PAT instructions again at home before surgery.

## 2020-12-18 ENCOUNTER — Encounter (HOSPITAL_COMMUNITY): Payer: Self-pay | Admitting: Surgery

## 2020-12-18 ENCOUNTER — Other Ambulatory Visit: Payer: Self-pay

## 2020-12-18 NOTE — Progress Notes (Signed)
Anesthesia Chart Review:  Pt is a same day work up   Case: [id number] Date/Time: 12/22/20 0845   Procedure: LAPAROSCOPIC CHOLECYSTECTOMY WITH INTRAOPERATIVE CHOLANGIOGRAM   Anesthesia type: General   Pre-op diagnosis: CHRONIC CHOLECYSTITIS, CHOLELITHIASIS   Location: [facility] ROOM 04 / [facility]   Surgeons: Darnell Level, MD       DISCUSSION: Pt is 60 years old with hx CAD (DES to proximal RCA 2016, residual moderate (70%) disease in mid and distal RCA), CHF (EF 55-60% on 06/29/20 echo), HTN, DM, CKD, OSA   PROVIDERS: Pcp, No - Cardiologist is [name], MD. Last office visit 06/15/20 with Robbie Lis, PA. Cleared for surgery 12/10/20 by Gillian Shields, NP   LABS: Will be obtained day of surgery    EKG 10/25/20: Sinus rhythm. Anterior infarct, old   CV: Echo 06/29/20:  1. Left ventricular ejection fraction, by estimation, is 55 to 60%. The left ventricle has normal function. The left ventricle has no regional wall motion abnormalities. There is moderate basal septal hypertrophy. There is mild LVH of the rest of the LV segments. Left ventricular diastolic parameters are consistent with Grade I diastolic dysfunction (impaired relaxation). Elevated left atrial pressure.   2. Right ventricular systolic function is normal. The right ventricular size is normal.   3. The mitral valve is normal in structure. Mild mitral valve regurgitation. No evidence of mitral stenosis.   4. The aortic valve is tricuspid. There is mild calcification of the aortic valve. There is mild thickening of the aortic valve. Aortic valve regurgitation is not visualized.   5. The inferior vena cava is normal in size with greater than 50% respiratory variability, suggesting right atrial pressure of 3 mmHg.    Cardiac cath 12/23/19:  Mid RCA lesion, 70% stenosed. Dist RCA lesion, 70% stenosed. Prox RCA lesion, 95% stenosed. There is a 0% residual stenosis post intervention. A drug-eluting stent was placed. Highly  tortuous and angulated dominant right coronary with hemodynamically significant proximal stenosis reduced from 90% to 0% with drug-eluting stent implantation. Potentially significant mid and distal lesions were not treated due to severe mid vessel tortuosity that prevented assessment with the pressure catheter. This was due to the angulation in the mid vessel. This raised concern that the stent would not be able to traverse the angulation. Future consideration of PCI of the mid and distal vessel should be of the patient develops anginal symptoms or ischemia on myocardial perfusion imaging. She did develop angina during balloon inflation which she has never before felt.   Past Medical History:  Diagnosis Date   CHF (congestive heart failure) (HCC) 08/16/2014   Chronic kidney disease 08/16/2014   Coronary artery disease    cath 12/23/2014 single vessel dx, DES to RCA, moderate mid RCA residual   Family history of adverse reaction to anesthesia    "it's hard to wake my mom after procedure is over"   History of blood transfusion 1992   S/P miscarriage   Hyperlipemia    Hypertension    Myocardial infarction (HCC) 08/16/2014   "mild"   Obesity (BMI 30-39.9) 04/08/2015   OSA (obstructive sleep apnea)    moderate with an AHI of    Sleep apnea    "recently tested positive; no mask yet" (12/23/2014)   Type II diabetes mellitus (HCC)     Past Surgical History:  Procedure Laterality Date   CARDIAC CATHETERIZATION N/A 12/23/2014   Procedure: Left Heart Cath and Coronary Angiography;  Surgeon: Laurey Morale, MD;  Location:  [facility] INVASIVE CV LAB;  Service: Cardiovascular;  Laterality: N/A;   CARDIAC CATHETERIZATION N/A 12/23/2014   Procedure: Intravascular Pressure Wire/FFR Study;  Surgeon: Lyn Records, MD;  Location: [facility] INVASIVE CV LAB;  Service: Cardiovascular;  Laterality: N/A;   CARDIAC CATHETERIZATION N/A 12/23/2014   Procedure: Coronary Stent Intervention;  Surgeon: Lyn Records, MD;  Location: [facility]  INVASIVE CV LAB;  Service: Cardiovascular;  Laterality: N/A;   CESAREAN SECTION  06/11/1995   COLONOSCOPY     CORONARY ANGIOPLASTY WITH STENT PLACEMENT  12/23/2014   "1"   TUMOR EXCISION Right 03/12/1999   "fatty; upper leg"    MEDICATIONS: No current facility-administered medications for this encounter.    aspirin EC 81 MG EC tablet   atorvastatin (LIPITOR) 80 MG tablet   carvedilol (COREG) 25 MG tablet   furosemide (LASIX) 40 MG tablet   glipiZIDE (GLUCOTROL) 5 MG tablet   hydrALAZINE (APRESOLINE) 100 MG tablet   isosorbide mononitrate (IMDUR) 60 MG 24 hr tablet   NOVOLIN N RELION 100 UNIT/ML injection   ondansetron (ZOFRAN ODT) 4 MG disintegrating tablet   spironolactone (ALDACTONE) 50 MG tablet   glucose blood test strip   omeprazole (PRILOSEC) 20 MG capsule   OneTouch Delica Lancets 33G MISC    If labs acceptable day of surgery, I anticipate pt can proceed with surgery as scheduled.  Rica Mast, PhD, FNP-BC [facility]/Anesthesiology Phone: [phone] 12/18/2020 3:55 PM

## 2020-12-18 NOTE — Anesthesia Preprocedure Evaluation (Addendum)
Anesthesia Evaluation  Patient identified by MRN, date of birth, ID band Patient awake    Reviewed: Allergy & Precautions, NPO status , Patient's Chart, lab work & pertinent test results  History of Anesthesia Complications Negative for: history of anesthetic complications  Airway Mallampati: III  TM Distance: >3 FB Neck ROM: Full    Dental  (+) Teeth Intact, Dental Advisory Given,    Pulmonary neg shortness of breath, sleep apnea , neg COPD, neg recent URI, former smoker,  Covid-19 Nucleic Acid Test Results Lab Results      Component                Value               Date                      SARSCOV2NAA              NEGATIVE            12/21/2020            Non compliant cpap   breath sounds clear to auscultation       Cardiovascular hypertension, Pt. on medications and Pt. on home beta blockers (-) angina+ CAD, + Past MI, + Cardiac Stents and +CHF   Rhythm:Regular  1. Left ventricular ejection fraction, by estimation, is 55 to 60%. The  left ventricle has normal function. The left ventricle has no regional  wall motion abnormalities. There is moderate basal septal hypertrophy.  There is mild LVH of the rest of the LV  segments. Left ventricular diastolic parameters are consistent with Grade  I diastolic dysfunction (impaired relaxation). Elevated left atrial  pressure.  2. Right ventricular systolic function is normal. The right ventricular  size is normal.  3. The mitral valve is normal in structure. Mild mitral valve  regurgitation. No evidence of mitral stenosis.  4. The aortic valve is tricuspid. There is mild calcification of the  aortic valve. There is mild thickening of the aortic valve. Aortic valve  regurgitation is not visualized.  5. The inferior vena cava is normal in size with greater than 50%  respiratory variability, suggesting right atrial pressure of 3 mmHg.    Neuro/Psych negative neurological  ROS  negative psych ROS   GI/Hepatic GERD  Medicated, CHRONIC CHOLECYSTITIS, CHOLELITHIASIS   Endo/Other  diabetes, Type 2, Insulin Dependent  Renal/GU Renal diseaseLab Results      Component                Value               Date                      CREATININE               1.14 (H)            10/25/2020           Lab Results      Component                Value               Date                      K  4.4                 10/25/2020                Musculoskeletal negative musculoskeletal ROS (+)   Abdominal   Peds  Hematology negative hematology ROS (+)   Anesthesia Other Findings   Reproductive/Obstetrics                            Anesthesia Physical Anesthesia Plan  ASA: 2  Anesthesia Plan: General   Post-op Pain Management:    Induction: Intravenous  PONV Risk Score and Plan: 3 and Ondansetron and Dexamethasone  Airway Management Planned: Oral ETT  Additional Equipment: None  Intra-op Plan:   Post-operative Plan: Extubation in OR  Informed Consent: I have reviewed the patients History and Physical, chart, labs and discussed the procedure including the risks, benefits and alternatives for the proposed anesthesia with the patient or authorized representative who has indicated his/her understanding and acceptance.     Dental advisory given  Plan Discussed with: CRNA and Surgeon  Anesthesia Plan Comments: (See APP note by Joslyn Hy, FNP   DISCUSSION: Pt is 60 years old with hx CAD (DES to proximal RCA 2016, residual moderate (70%) disease in mid and distal RCA), CHF (EF 55-60% on 06/29/20 echo), HTN, DM, CKD, OSA   PROVIDERS: Pcp, No - Cardiologist is [name], MD. Last office visit 06/15/20 with Robbie Lis, PA. Cleared for surgery 12/10/20 by Gillian Shields, NP   LABS: Will be obtained day of surgery    EKG 10/25/20: Sinus rhythm. Anterior infarct, old   CV: Echo 06/29/20:   1. Left ventricular ejection fraction, by estimation, is 55 to 60%. The left ventricle has normal function. The left ventricle has no regional wall motion abnormalities. There is moderate basal septal hypertrophy. There is mild LVH of the rest of the LV segments. Left ventricular diastolic parameters are consistent with Grade I diastolic dysfunction (impaired relaxation). Elevated left atrial pressure.  2. Right ventricular systolic function is normal. The right ventricular size is normal.  3. The mitral valve is normal in structure. Mild mitral valve regurgitation. No evidence of mitral stenosis.  4. The aortic valve is tricuspid. There is mild calcification of the aortic valve. There is mild thickening of the aortic valve. Aortic valve regurgitation is not visualized.  5. The inferior vena cava is normal in size with greater than 50% respiratory variability, suggesting right atrial pressure of 3 mmHg.    Cardiac cath 12/23/19:  1. Mid RCA lesion, 70% stenosed. 2. Dist RCA lesion, 70% stenosed. 3. Prox RCA lesion, 95% stenosed. There is a 0% residual stenosis post intervention. 4. A drug-eluting stent was placed. ? Highly tortuous and angulated dominant right coronary with hemodynamically significant proximal stenosis reduced from 90% to 0% with drug-eluting stent implantation. ? Potentially significant mid and distal lesions were not treated due to severe mid vessel tortuosity that prevented assessment with the pressure catheter. This was due to the angulation in the mid vessel. This raised concern that the stent would not be able to traverse the angulation. ? Future consideration of PCI of the mid and distal vessel should be of the patient develops anginal symptoms or ischemia on myocardial perfusion imaging. She did develop angina during balloon inflation which she has never before felt. )       Anesthesia Quick Evaluation

## 2020-12-21 ENCOUNTER — Other Ambulatory Visit (HOSPITAL_COMMUNITY)
Admission: RE | Admit: 2020-12-21 | Discharge: 2020-12-21 | Disposition: A | Discharge status: Home / Self Care | Payer: 59 | Source: Ambulatory Visit | Attending: Surgery | Admitting: Surgery

## 2020-12-21 ENCOUNTER — Encounter (HOSPITAL_COMMUNITY): Payer: Self-pay | Admitting: Surgery

## 2020-12-21 DIAGNOSIS — Z20822 Contact with and (suspected) exposure to covid-19: Secondary | ICD-10-CM | POA: Diagnosis not present

## 2020-12-21 DIAGNOSIS — R160 Hepatomegaly, not elsewhere classified: Secondary | ICD-10-CM | POA: Diagnosis present

## 2020-12-21 DIAGNOSIS — K8010 Calculus of gallbladder with chronic cholecystitis without obstruction: Secondary | ICD-10-CM | POA: Diagnosis present

## 2020-12-21 DIAGNOSIS — Z01812 Encounter for preprocedural laboratory examination: Secondary | ICD-10-CM | POA: Diagnosis present

## 2020-12-21 LAB — SARS CORONAVIRUS 2 (TAT 6-24 HRS): SARS Coronavirus 2: NEGATIVE

## 2020-12-21 NOTE — H&P (Signed)
General Surgery [facility]  Misty Miller DOB: [birth year] Married / Language: English / Race: Black or African American Female   History of Present Illness   The patient is a 60 year old female who presents for evaluation of gall stones.  CHIEF COMPLAINT: symptomatic cholelithiasis, chronic cholecystitis, liver lesion  Patient is referred from the emergency department at [facility] by Glenna Fellows, PA-C, for surgical evaluation and management of symptomatic cholelithiasis, chronic cholecystitis, and left hepatic lobe mass.  Patient presented to the emergency department in mid April 2022 with acute onset of epigastric abdominal discomfort, nausea, and emesis.  This was her first such episode.  Evaluation included laboratory studies show a normal hepatic enzymes.  Ultrasound was obtained which showed a large 2.5 cm gallstone without evidence of infection.  Also noted was a 1.2 cm lesion in the left hepatic lobe for which follow-up MRI scan was recommended.  Patient has no prior history of hepatobiliary disease.  She denies jaundice or acholic stools.  She denies hepatitis or pancreatitis.  Previous abdominal surgery includes cesarean section.  Patient does have diabetes.  There is a family history of gallbladder disease and the patient's mother.  Patient now presents for consideration for cholecystectomy for management of symptomatic cholelithiasis and chronic cholecystitis.   Past Surgical History  Cesarean Section - 1   Valve Replacement    Diagnostic Studies History  Colonoscopy   5-10 years ago Pap Smear   >5 years ago  Allergies  No Known Drug Allergies  Allergies Reconciled    Medication History Atorvastatin Calcium  (80MG  Tablet, Oral) Active. Carvedilol  (25MG  Tablet, Oral) Active. Furosemide  (40MG  Tablet, Oral) Active. glipiZIDE  (5MG  Tablet, Oral) Active. hydrALAZINE HCl  (25MG  Tablet, Oral) Active. Isosorbide Mononitrate ER  (60MG  Tablet ER  24HR, Oral) Active. Omeprazole  (20MG  Capsule DR, Oral) Active. Ondansetron  (4MG  Tablet Disint, Oral) Active. NovoLIN N ReliOn  (100UNIT/ML Suspension, Subcutaneous) Active. Spironolactone  (50MG  Tablet, Oral) Active. Medications Reconciled   Social History  Alcohol use   Remotely quit alcohol use. Caffeine use   Coffee. No drug use   Tobacco use   Former smoker.  Family History  Breast Cancer   Mother. Diabetes Mellitus   Mother. Heart Disease   Mother. Hypertension   Father, Mother.  Pregnancy / Birth History  Age of menopause   16-50 Gravida   2 Irregular periods   Maternal age   69-30 Para   1  Other Problems  Cholelithiasis   Congestive Heart Failure   Diabetes Mellitus   High blood pressure   Hypercholesterolemia   Myocardial infarction   Sleep Apnea    Review of Systems  General Not Present- Appetite Loss, Chills, Fatigue, Fever, Night Sweats, Weight Gain and Weight Loss. Skin Not Present- Change in Wart/Mole, Dryness, Hives, Jaundice, New Lesions, Non-Healing Wounds, Rash and Ulcer. HEENT Not Present- Earache, Hearing Loss, Hoarseness, Nose Bleed, Oral Ulcers, Ringing in the Ears, Seasonal Allergies, Sinus Pain, Sore Throat, Visual Disturbances, Wears glasses/contact lenses and Yellow Eyes. Respiratory Not Present- Bloody sputum, Chronic Cough, Difficulty Breathing, Snoring and Wheezing. Breast Not Present- Breast Mass, Breast Pain, Nipple Discharge and Skin Changes. Cardiovascular Present- Swelling of Extremities. Not Present- Chest Pain, Difficulty Breathing Lying Down, Leg Cramps, Palpitations, Rapid Heart Rate and Shortness of Breath. Gastrointestinal Present- Nausea. Not Present- Abdominal Pain, Bloating, Bloody Stool, Change in Bowel Habits, Chronic diarrhea, Constipation, Difficulty Swallowing, Excessive gas, Gets full quickly at meals, Hemorrhoids, Indigestion, Rectal Pain  and Vomiting. Female Genitourinary Not Present- Frequency, Nocturia, Painful  Urination, Pelvic Pain and Urgency. Musculoskeletal Not Present- Back Pain, Joint Pain, Joint Stiffness, Muscle Pain, Muscle Weakness and Swelling of Extremities. Psychiatric Not Present- Anxiety, Bipolar, Change in Sleep Pattern, Depression, Fearful and Frequent crying. Endocrine Present- Hot flashes. Not Present- Cold Intolerance, Excessive Hunger, Hair Changes, Heat Intolerance and New Diabetes. Hematology Not Present- Blood Thinners, Easy Bruising, Excessive bleeding, Gland problems, HIV and Persistent Infections.  Vitals  Weight: 178.5 lb   Height: 62 in  Body Surface Area: 1.82 m   Body Mass Index: 32.65 kg/m   Temp.: 97.5 F    Pulse: 86 (Regular)    P.OX: 98% (Room air) BP: 140/100(Sitting, Left Arm, Standard)  Physical Exam  GENERAL APPEARANCE Development: normal Nutritional status: normal Gross deformities: none  SKIN Rash, lesions, ulcers: none Induration, erythema: none Nodules: none palpable  EYES Conjunctiva and lids: normal Pupils: equal and reactive Iris: normal bilaterally  EARS, NOSE, MOUTH, THROAT External ears: no lesion or deformity External nose: no lesion or deformity Hearing: grossly normal Due to Covid-19 pandemic, patient is wearing a mask.  NECK Symmetric: yes Trachea: midline Thyroid: no palpable nodules in the thyroid bed  CHEST Respiratory effort: normal Retraction or accessory muscle use: no Breath sounds: normal bilaterally Rales, rhonchi, wheeze: none  CARDIOVASCULAR Auscultation: regular rhythm, normal rate Murmurs: none Pulses: radial pulse 2+ palpable Lower extremity edema: none  ABDOMEN Distension: none Masses: none palpable Tenderness: Mild tenderness to palpation in epigastrium and right upper quadrant Hepatosplenomegaly: not present Hernia: not present  MUSCULOSKELETAL Station and gait: normal Digits and nails: no clubbing or cyanosis Muscle strength: grossly normal all extremities Range of motion: grossly normal  all extremities Deformity: none  LYMPHATIC Cervical: none palpable Supraclavicular: none palpable  PSYCHIATRIC Oriented to person, place, and time: yes Mood and affect: normal for situation Judgment and insight: appropriate for situation    Assessment & Plan   CHOLELITHIASIS WITH CHRONIC CHOLECYSTITIS (K80.10) LIVER MASS, LEFT LOBE (R16.0)  Patient is referred from the emergency department with symptomatic cholelithiasis and chronic cholecystitis.  Also noted was a 1.2 cm mass in the left hepatic lobe which will require further evaluation.  Patient is provided with written literature on gallbladder surgery to review at home.  Patient had an episode of significant biliary colic in mid April 2022.  Ultrasound documented gallstones up to 2.5 cm in diameter.  Patient has had mild symptoms intermittently since her emergency room visit.  Symptoms are usually associated with meals.  She denies any jaundice or acholic stools.  Patient has not had further evaluation of the left hepatic lobe mass.  I have recommended proceeding with the contrasted MRI scan as recommended by radiology to better evaluate to 1.2 cm mass in the left hepatic lobe.  Hopefully that will be a benign finding.  If so, then I would recommend proceeding with laparoscopic cholecystectomy with intraoperative cholangiography for management of chronic cholecystitis and cholelithiasis.  I think this is especially important as the patient has remained mildly symptomatic and does have diabetes mellitus.  We discussed the procedure.  We discussed the risk and benefits of surgery.  We discussed the potential for conversion to an open procedure.  We discussed performing intraoperative cholangiography.  We discussed the hospital stay to be anticipated in the postoperative recovery and return to work and activities.  The patient understands and wishes to proceed in the near future.  The risks and benefits of the procedure have been discussed  at length with the patient.  The patient understands the proposed procedure, potential alternative treatments, and the course of recovery to be expected.  All of the patient's questions have been answered at this time.  The patient wishes to proceed with surgery.  Darnell Level, MD [facility] Office: [phone]

## 2020-12-22 ENCOUNTER — Ambulatory Visit (HOSPITAL_COMMUNITY): Payer: 59 | Admitting: Emergency Medicine

## 2020-12-22 ENCOUNTER — Encounter (HOSPITAL_COMMUNITY): Payer: Self-pay | Admitting: Surgery

## 2020-12-22 ENCOUNTER — Encounter (HOSPITAL_COMMUNITY)
Admission: RE | Disposition: A | Discharge status: Home / Self Care | Payer: Self-pay | Source: Home / Self Care | Attending: Surgery

## 2020-12-22 ENCOUNTER — Other Ambulatory Visit: Payer: Self-pay

## 2020-12-22 ENCOUNTER — Ambulatory Visit (HOSPITAL_COMMUNITY): Payer: 59

## 2020-12-22 ENCOUNTER — Ambulatory Visit (HOSPITAL_COMMUNITY)
Admission: RE | Admit: 2020-12-22 | Discharge: 2020-12-23 | Disposition: A | Discharge status: Home / Self Care | Payer: 59 | Attending: Surgery | Admitting: Surgery

## 2020-12-22 DIAGNOSIS — Z833 Family history of diabetes mellitus: Secondary | ICD-10-CM | POA: Insufficient documentation

## 2020-12-22 DIAGNOSIS — N189 Chronic kidney disease, unspecified: Secondary | ICD-10-CM | POA: Diagnosis not present

## 2020-12-22 DIAGNOSIS — Z87891 Personal history of nicotine dependence: Secondary | ICD-10-CM | POA: Diagnosis not present

## 2020-12-22 DIAGNOSIS — Z79899 Other long term (current) drug therapy: Secondary | ICD-10-CM | POA: Insufficient documentation

## 2020-12-22 DIAGNOSIS — I509 Heart failure, unspecified: Secondary | ICD-10-CM | POA: Insufficient documentation

## 2020-12-22 DIAGNOSIS — K828 Other specified diseases of gallbladder: Secondary | ICD-10-CM | POA: Diagnosis not present

## 2020-12-22 DIAGNOSIS — Z794 Long term (current) use of insulin: Secondary | ICD-10-CM | POA: Insufficient documentation

## 2020-12-22 DIAGNOSIS — K8010 Calculus of gallbladder with chronic cholecystitis without obstruction: Secondary | ICD-10-CM | POA: Diagnosis not present

## 2020-12-22 DIAGNOSIS — I130 Hypertensive heart and chronic kidney disease with heart failure and stage 1 through stage 4 chronic kidney disease, or unspecified chronic kidney disease: Secondary | ICD-10-CM | POA: Diagnosis not present

## 2020-12-22 DIAGNOSIS — E1122 Type 2 diabetes mellitus with diabetic chronic kidney disease: Secondary | ICD-10-CM | POA: Diagnosis not present

## 2020-12-22 DIAGNOSIS — Z8249 Family history of ischemic heart disease and other diseases of the circulatory system: Secondary | ICD-10-CM | POA: Diagnosis not present

## 2020-12-22 DIAGNOSIS — Z803 Family history of malignant neoplasm of breast: Secondary | ICD-10-CM | POA: Diagnosis not present

## 2020-12-22 DIAGNOSIS — Z7984 Long term (current) use of oral hypoglycemic drugs: Secondary | ICD-10-CM | POA: Insufficient documentation

## 2020-12-22 DIAGNOSIS — Z955 Presence of coronary angioplasty implant and graft: Secondary | ICD-10-CM | POA: Insufficient documentation

## 2020-12-22 DIAGNOSIS — Z952 Presence of prosthetic heart valve: Secondary | ICD-10-CM | POA: Insufficient documentation

## 2020-12-22 DIAGNOSIS — Z419 Encounter for procedure for purposes other than remedying health state, unspecified: Secondary | ICD-10-CM

## 2020-12-22 DIAGNOSIS — R160 Hepatomegaly, not elsewhere classified: Secondary | ICD-10-CM | POA: Diagnosis present

## 2020-12-22 HISTORY — PX: GALLBLADDER SURGERY: SHX652

## 2020-12-22 HISTORY — PX: CHOLECYSTECTOMY: SHX55

## 2020-12-22 LAB — BASIC METABOLIC PANEL
Anion gap: 7 (ref 5–15)
BUN: 17 mg/dL (ref 6–20)
CO2: 27 mmol/L (ref 22–32)
Calcium: 8.9 mg/dL (ref 8.9–10.3)
Chloride: 103 mmol/L (ref 98–111)
Creatinine, Ser: 0.95 mg/dL (ref 0.44–1.00)
GFR, Estimated: >60 mL/min (ref >60)
Glucose, Bld: 203 mg/dL — ABNORMAL HIGH (ref 70–99)
Potassium: 4 mmol/L (ref 3.5–5.1)
Sodium: 137 mmol/L (ref 135–145)

## 2020-12-22 LAB — GLUCOSE, CAPILLARY
Glucose-Capillary: 208 mg/dL — ABNORMAL HIGH (ref 70–99)
Glucose-Capillary: 248 mg/dL — ABNORMAL HIGH (ref 70–99)
Glucose-Capillary: 218 mg/dL — ABNORMAL HIGH (ref 70–99)
Glucose-Capillary: 291 mg/dL — ABNORMAL HIGH (ref 70–99)

## 2020-12-22 LAB — CBC
HCT: 36.6 % (ref 36.0–46.0)
Hemoglobin: 12.1 g/dL (ref 12.0–15.0)
MCH: 28.7 pg (ref 26.0–34.0)
MCHC: 33.1 g/dL (ref 30.0–36.0)
MCV: 86.9 fL (ref 80.0–100.0)
Platelets: 234 10*3/uL (ref 150–400)
RBC: 4.21 MIL/uL (ref 3.87–5.11)
RDW: 13.6 % (ref 11.5–15.5)
WBC: 7.1 10*3/uL (ref 4.0–10.5)
nRBC: 0 % (ref 0.0–0.2)

## 2020-12-22 SURGERY — LAPAROSCOPIC CHOLECYSTECTOMY WITH INTRAOPERATIVE CHOLANGIOGRAM
Anesthesia: General | Site: Abdomen

## 2020-12-22 MED ORDER — SUGAMMADEX SODIUM 200 MG/2ML IV SOLN
INTRAVENOUS | Status: DC | PRN
  Administered 2020-12-22: 200 mg via INTRAVENOUS

## 2020-12-22 MED ORDER — KETOROLAC TROMETHAMINE 30 MG/ML IJ SOLN
INTRAMUSCULAR | Status: DC | PRN
  Administered 2020-12-22: 15 mg via INTRAVENOUS

## 2020-12-22 MED ORDER — INSULIN ASPART 100 UNIT/ML IJ SOLN
0.0000 [IU] | Freq: Three times a day (TID) | INTRAMUSCULAR | Status: DC
  Administered 2020-12-22: 5 [IU] via SUBCUTANEOUS
  Administered 2020-12-23: 3 [IU] via SUBCUTANEOUS

## 2020-12-22 MED ORDER — ROCURONIUM BROMIDE 10 MG/ML (PF) SYRINGE
PREFILLED_SYRINGE | INTRAVENOUS | Status: DC | PRN
  Administered 2020-12-22: 60 mg via INTRAVENOUS

## 2020-12-22 MED ORDER — INSULIN NPH (HUMAN) (ISOPHANE) 100 UNIT/ML ~~LOC~~ SUSP
10.0000 [IU] | Freq: Every day | SUBCUTANEOUS | Status: DC
  Administered 2020-12-22: 10 [IU] via SUBCUTANEOUS
  Filled 2020-12-22: qty 10

## 2020-12-22 MED ORDER — ACETAMINOPHEN 160 MG/5ML PO SOLN: 1000.0000 mg | Freq: Once | ORAL | Status: DC | PRN

## 2020-12-22 MED ORDER — ACETAMINOPHEN 10 MG/ML IV SOLN: 1000.0000 mg | Freq: Once | INTRAVENOUS | Status: DC | PRN

## 2020-12-22 MED ORDER — FUROSEMIDE 40 MG PO TABS
40.0000 mg | ORAL_TABLET | Freq: Every day | ORAL | Status: DC
  Administered 2020-12-22 – 2020-12-23 (×2): 40 mg via ORAL
  Filled 2020-12-22 (×2): qty 1

## 2020-12-22 MED ORDER — ACETAMINOPHEN 650 MG RE SUPP: 650.0000 mg | Freq: Four times a day (QID) | RECTAL | Status: DC | PRN

## 2020-12-22 MED ORDER — ONDANSETRON HCL 4 MG/2ML IJ SOLN
INTRAMUSCULAR | Status: AC
  Filled 2020-12-22: qty 2

## 2020-12-22 MED ORDER — MIDAZOLAM HCL 5 MG/5ML IJ SOLN
INTRAMUSCULAR | Status: DC | PRN
  Administered 2020-12-22: 2 mg via INTRAVENOUS

## 2020-12-22 MED ORDER — HYDRALAZINE HCL 100 MG PO TABS: 100.0000 mg | ORAL_TABLET | Freq: Three times a day (TID) | ORAL | Status: DC

## 2020-12-22 MED ORDER — LIDOCAINE 2% (20 MG/ML) 5 ML SYRINGE
INTRAMUSCULAR | Status: DC | PRN
  Administered 2020-12-22: 60 mg via INTRAVENOUS

## 2020-12-22 MED ORDER — LACTATED RINGERS IV SOLN: INTRAVENOUS | Status: DC

## 2020-12-22 MED ORDER — BUPIVACAINE-EPINEPHRINE (PF) 0.5% -1:200000 IJ SOLN
INTRAMUSCULAR | Status: AC
  Filled 2020-12-22: qty 30

## 2020-12-22 MED ORDER — LACTATED RINGERS IR SOLN
Status: DC | PRN
  Administered 2020-12-22: 100 mL

## 2020-12-22 MED ORDER — PROPOFOL 10 MG/ML IV BOLUS
INTRAVENOUS | Status: AC
  Filled 2020-12-22: qty 20

## 2020-12-22 MED ORDER — MIDAZOLAM HCL 2 MG/2ML IJ SOLN
INTRAMUSCULAR | Status: AC
  Filled 2020-12-22: qty 2

## 2020-12-22 MED ORDER — ISOSORBIDE MONONITRATE ER 60 MG PO TB24
60.0000 mg | ORAL_TABLET | Freq: Every day | ORAL | Status: DC
  Administered 2020-12-22 – 2020-12-23 (×2): 60 mg via ORAL
  Filled 2020-12-22 (×2): qty 1

## 2020-12-22 MED ORDER — LIDOCAINE 2% (20 MG/ML) 5 ML SYRINGE
INTRAMUSCULAR | Status: AC
  Filled 2020-12-22: qty 5

## 2020-12-22 MED ORDER — ACETAMINOPHEN 10 MG/ML IV SOLN
INTRAVENOUS | Status: AC
  Filled 2020-12-22: qty 100

## 2020-12-22 MED ORDER — ACETAMINOPHEN 10 MG/ML IV SOLN
INTRAVENOUS | Status: DC | PRN
  Administered 2020-12-22: 1000 mg via INTRAVENOUS

## 2020-12-22 MED ORDER — CHLORHEXIDINE GLUCONATE CLOTH 2 % EX PADS: 6.0000 | MEDICATED_PAD | Freq: Once | CUTANEOUS | Status: DC

## 2020-12-22 MED ORDER — ACETAMINOPHEN 325 MG PO TABS
650.0000 mg | ORAL_TABLET | Freq: Four times a day (QID) | ORAL | Status: DC | PRN
  Filled 2020-12-22: qty 2

## 2020-12-22 MED ORDER — FENTANYL CITRATE (PF) 100 MCG/2ML IJ SOLN
INTRAMUSCULAR | Status: AC
  Filled 2020-12-22: qty 2

## 2020-12-22 MED ORDER — BUPIVACAINE-EPINEPHRINE 0.5% -1:200000 IJ SOLN
INTRAMUSCULAR | Status: DC | PRN
Start: 2020-12-22 — End: 2020-12-22
  Administered 2020-12-22: 30 mL

## 2020-12-22 MED ORDER — CEFAZOLIN SODIUM-DEXTROSE 2-4 GM/100ML-% IV SOLN
2.0000 g | INTRAVENOUS | Status: AC
  Administered 2020-12-22: 2 g via INTRAVENOUS
  Filled 2020-12-22: qty 100

## 2020-12-22 MED ORDER — LABETALOL HCL 5 MG/ML IV SOLN
INTRAVENOUS | Status: DC | PRN
  Administered 2020-12-22 (×2): 2.5 mg via INTRAVENOUS

## 2020-12-22 MED ORDER — INSULIN NPH (HUMAN) (ISOPHANE) 100 UNIT/ML ~~LOC~~ SUSP
15.0000 [IU] | Freq: Every day | SUBCUTANEOUS | Status: DC
  Administered 2020-12-23: 15 [IU] via SUBCUTANEOUS
  Filled 2020-12-22: qty 10

## 2020-12-22 MED ORDER — CHLORHEXIDINE GLUCONATE 0.12 % MT SOLN
15.0000 mL | Freq: Once | OROMUCOSAL | Status: AC
  Administered 2020-12-22: 15 mL via OROMUCOSAL

## 2020-12-22 MED ORDER — SPIRONOLACTONE 25 MG PO TABS
50.0000 mg | ORAL_TABLET | Freq: Every day | ORAL | Status: DC
  Administered 2020-12-22 – 2020-12-23 (×2): 50 mg via ORAL
  Filled 2020-12-22 (×3): qty 2

## 2020-12-22 MED ORDER — ROCURONIUM BROMIDE 10 MG/ML (PF) SYRINGE
PREFILLED_SYRINGE | INTRAVENOUS | Status: AC
  Filled 2020-12-22: qty 10

## 2020-12-22 MED ORDER — INSULIN NPH (HUMAN) (ISOPHANE) 100 UNIT/ML ~~LOC~~ SUSP
10.0000 [IU] | Freq: Every day | SUBCUTANEOUS | Status: DC
  Filled 2020-12-22: qty 10

## 2020-12-22 MED ORDER — FENTANYL CITRATE (PF) 100 MCG/2ML IJ SOLN: 25.0000 ug | INTRAMUSCULAR | Status: DC | PRN

## 2020-12-22 MED ORDER — ORAL CARE MOUTH RINSE: 15.0000 mL | Freq: Once | OROMUCOSAL | Status: AC

## 2020-12-22 MED ORDER — DEXAMETHASONE SODIUM PHOSPHATE 10 MG/ML IJ SOLN
INTRAMUSCULAR | Status: AC
  Filled 2020-12-22: qty 1

## 2020-12-22 MED ORDER — OXYCODONE HCL 5 MG PO TABS
5.0000 mg | ORAL_TABLET | Freq: Once | ORAL | Status: AC | PRN
  Administered 2020-12-22: 5 mg via ORAL

## 2020-12-22 MED ORDER — CARVEDILOL 25 MG PO TABS
25.0000 mg | ORAL_TABLET | Freq: Two times a day (BID) | ORAL | Status: DC
  Administered 2020-12-22 – 2020-12-23 (×2): 25 mg via ORAL
  Filled 2020-12-22 (×2): qty 1

## 2020-12-22 MED ORDER — PROPOFOL 10 MG/ML IV BOLUS
INTRAVENOUS | Status: DC | PRN
  Administered 2020-12-22: 180 mg via INTRAVENOUS

## 2020-12-22 MED ORDER — HYDRALAZINE HCL 50 MG PO TABS
100.0000 mg | ORAL_TABLET | Freq: Three times a day (TID) | ORAL | Status: DC
  Administered 2020-12-22 – 2020-12-23 (×3): 100 mg via ORAL
  Filled 2020-12-22 (×3): qty 2

## 2020-12-22 MED ORDER — FENTANYL CITRATE (PF) 100 MCG/2ML IJ SOLN
INTRAMUSCULAR | Status: DC | PRN
  Administered 2020-12-22 (×3): 50 ug via INTRAVENOUS

## 2020-12-22 MED ORDER — OXYCODONE HCL 5 MG/5ML PO SOLN: 5.0000 mg | Freq: Once | ORAL | Status: AC | PRN

## 2020-12-22 MED ORDER — ONDANSETRON HCL 4 MG/2ML IJ SOLN
INTRAMUSCULAR | Status: DC | PRN
  Administered 2020-12-22: 4 mg via INTRAVENOUS

## 2020-12-22 MED ORDER — INSULIN NPH (HUMAN) (ISOPHANE) 100 UNIT/ML ~~LOC~~ SUSP
15.0000 [IU] | Freq: Every day | SUBCUTANEOUS | Status: DC
  Administered 2020-12-22: 7 [IU] via SUBCUTANEOUS
  Filled 2020-12-22: qty 10

## 2020-12-22 MED ORDER — DEXAMETHASONE SODIUM PHOSPHATE 10 MG/ML IJ SOLN
INTRAMUSCULAR | Status: DC | PRN
  Administered 2020-12-22: 4 mg via INTRAVENOUS

## 2020-12-22 MED ORDER — OXYCODONE HCL 5 MG PO TABS
ORAL_TABLET | ORAL | Status: AC
  Filled 2020-12-22: qty 1

## 2020-12-22 MED ORDER — INSULIN NPH (HUMAN) (ISOPHANE) 100 UNIT/ML ~~LOC~~ SUSP: 10.0000 [IU] | SUBCUTANEOUS | Status: DC

## 2020-12-22 MED ORDER — ACETAMINOPHEN 500 MG PO TABS: 1000.0000 mg | ORAL_TABLET | Freq: Once | ORAL | Status: DC | PRN

## 2020-12-22 SURGICAL SUPPLY — 39 items
ADH SKN CLS APL DERMABOND .7 (GAUZE/BANDAGES/DRESSINGS) ×1
APL PRP STRL LF DISP 70% ISPRP (MISCELLANEOUS) ×1
APPLIER CLIP ROT 10 11.4 M/L (STAPLE) ×3
APR CLP MED LRG 11.4X10 (STAPLE) ×1
BAG SPEC RTRVL LRG 6X4 10 (ENDOMECHANICALS) ×1
CABLE HIGH FREQUENCY MONO STRZ (ELECTRODE) ×3 IMPLANT
CHLORAPREP W/TINT 26 (MISCELLANEOUS) ×4 IMPLANT
CLIP APPLIE ROT 10 11.4 M/L (STAPLE) ×1 IMPLANT
CLOSURE WOUND 1/2 X4 (GAUZE/BANDAGES/DRESSINGS)
COVER MAYO STAND STRL (DRAPES) ×1 IMPLANT
COVER SURGICAL LIGHT HANDLE (MISCELLANEOUS) ×3 IMPLANT
COVER WAND RF STERILE (DRAPES) IMPLANT
DECANTER SPIKE VIAL GLASS SM (MISCELLANEOUS) ×1 IMPLANT
DERMABOND ADVANCED (GAUZE/BANDAGES/DRESSINGS) ×2
DERMABOND ADVANCED .7 DNX12 (GAUZE/BANDAGES/DRESSINGS) IMPLANT
DRAPE C-ARM 42X120 X-RAY (DRAPES) ×3 IMPLANT
ELECT REM PT RETURN 15FT ADLT (MISCELLANEOUS) ×3 IMPLANT
GAUZE SPONGE 2X2 8PLY STRL LF (GAUZE/BANDAGES/DRESSINGS) ×1 IMPLANT
GLOVE SURG ORTHO LTX SZ8 (GLOVE) ×3 IMPLANT
GOWN STRL REUS W/TWL XL LVL3 (GOWN DISPOSABLE) ×8 IMPLANT
HEMOSTAT SURGICEL 4X8 (HEMOSTASIS) IMPLANT
KIT BASIN OR (CUSTOM PROCEDURE TRAY) ×3 IMPLANT
KIT TURNOVER KIT A (KITS) ×3 IMPLANT
PENCIL SMOKE EVACUATOR (MISCELLANEOUS) IMPLANT
POUCH SPECIMEN RETRIEVAL 10MM (ENDOMECHANICALS) ×3 IMPLANT
SCISSORS LAP 5X35 DISP (ENDOMECHANICALS) ×3 IMPLANT
SET CHOLANGIOGRAPH MIX (MISCELLANEOUS) ×3 IMPLANT
SET IRRIG TUBING LAPAROSCOPIC (IRRIGATION / IRRIGATOR) ×3 IMPLANT
SET TUBE SMOKE EVAC HIGH FLOW (TUBING) ×2 IMPLANT
SLEEVE XCEL OPT CAN 5 100 (ENDOMECHANICALS) ×3 IMPLANT
SPONGE GAUZE 2X2 STER 10/PKG (GAUZE/BANDAGES/DRESSINGS)
STRIP CLOSURE SKIN 1/2X4 (GAUZE/BANDAGES/DRESSINGS) IMPLANT
SUT MNCRL AB 4-0 PS2 18 (SUTURE) ×3 IMPLANT
TOWEL OR 17X26 10 PK STRL BLUE (TOWEL DISPOSABLE) ×3 IMPLANT
TOWEL OR NON WOVEN STRL DISP B (DISPOSABLE) ×3 IMPLANT
TRAY LAPAROSCOPIC (CUSTOM PROCEDURE TRAY) ×3 IMPLANT
TROCAR BLADELESS OPT 5 100 (ENDOMECHANICALS) ×3 IMPLANT
TROCAR XCEL BLUNT TIP 100MML (ENDOMECHANICALS) ×3 IMPLANT
TROCAR XCEL NON-BLD 11X100MML (ENDOMECHANICALS) ×3 IMPLANT

## 2020-12-22 NOTE — Transfer of Care (Signed)
Immediate Anesthesia Transfer of Care Note  Patient: Misty Miller  Procedure(s) Performed: LAPAROSCOPIC CHOLECYSTECTOMY WITH INTRAOPERATIVE CHOLANGIOGRAM (Abdomen)  Patient Location: PACU  Anesthesia Type:General  Level of Consciousness: awake, drowsy and patient cooperative  Airway & Oxygen Therapy: Patient Spontanous Breathing and Patient connected to face mask oxygen  Post-op Assessment: Report given to RN and Post -op Vital signs reviewed and stable  Post vital signs: Reviewed and stable  Last Vitals:  Vitals Value Taken Time  BP 185/92 12/22/20 1035  Temp    Pulse 58 12/22/20 1039  Resp 19 12/22/20 1039  SpO2 99 % 12/22/20 1039  Vitals shown include unvalidated device data.  Last Pain:  Vitals:   12/22/20 0718  TempSrc: Oral  PainSc: 0-No pain      Patients Stated Pain Goal: 3 (12/18/20 0955)  Complications: No notable events documented.

## 2020-12-22 NOTE — Interval H&P Note (Signed)
History and Physical Interval Note:  12/22/2020 9:06 AM  Misty Miller  has presented today for surgery, with the diagnosis of CHRONIC CHOLECYSTITIS, CHOLELITHIASIS.  The various methods of treatment have been discussed with the patient and family. After consideration of risks, benefits and other options for treatment, the patient has consented to    Procedure(s): LAPAROSCOPIC CHOLECYSTECTOMY WITH INTRAOPERATIVE CHOLANGIOGRAM (N/A) as a surgical intervention.    The patient's history has been reviewed, patient examined, no change in status, stable for surgery.  I have reviewed the patient's chart and labs.  Questions were answered to the patient's satisfaction.    Darnell Level, MD [facility] Office: [phone]   Darnell Level

## 2020-12-22 NOTE — Anesthesia Procedure Notes (Addendum)
Procedure Name: Intubation Date/Time: 12/22/2020 9:26 AM Performed by: West Pugh, CRNA Pre-anesthesia Checklist: Patient identified, Emergency Drugs available, Suction available, Patient being monitored and Timeout performed Patient Re-evaluated:Patient Re-evaluated prior to induction Oxygen Delivery Method: Circle system utilized Preoxygenation: Pre-oxygenation with 100% oxygen Induction Type: IV induction Ventilation: Two handed mask ventilation required and Oral airway inserted - appropriate to patient size Laryngoscope Size: Mac and 3 Grade View: Grade II Tube type: Oral Tube size: 7.0 mm Number of attempts: 1 Airway Equipment and Method: Stylet Placement Confirmation: ETT inserted through vocal cords under direct vision, positive ETCO2, CO2 detector and breath sounds checked- equal and bilateral Secured at: 21 cm Tube secured with: Tape Dental Injury: Teeth and Oropharynx as per pre-operative assessment

## 2020-12-22 NOTE — Plan of Care (Signed)
Discussed with patient and husband about plan of care for post-op day 0. ° ° °Will continue to monitor patient.  ° ° °SWhittemore, RN ° °

## 2020-12-22 NOTE — Op Note (Signed)
Procedure Note  Pre-operative Diagnosis:  chronic cholecystitis, cholelithiasis  Post-operative Diagnosis:  same  Surgeon:  Darnell Level, MD  Assistant:  none   Procedure:  Laparoscopic cholecystectomy with intra-operative cholangiography  Anesthesia:  General  Estimated Blood Loss:  minimal  Drains: none         Specimen: gallbladder to pathology  Indications:  Patient is referred from the emergency department at [facility] by Glenna Fellows, PA-C, for surgical evaluation and management of symptomatic cholelithiasis, chronic cholecystitis, and left hepatic lobe mass.  Patient presented to the emergency department in mid April 2022 with acute onset of epigastric abdominal discomfort, nausea, and emesis.  This was her first such episode.  Evaluation included laboratory studies show a normal hepatic enzymes.  Ultrasound was obtained which showed a large 2.5 cm gallstone without evidence of infection.  Also noted was a 1.2 cm lesion in the left hepatic lobe for which follow-up MRI scan was recommended.  Patient has no prior history of hepatobiliary disease.  She denies jaundice or acholic stools.  She denies hepatitis or pancreatitis.  Previous abdominal surgery includes cesarean section.  Patient does have diabetes.  There is a family history of gallbladder disease and the patient's mother.  Patient now presents for consideration for cholecystectomy for management of symptomatic cholelithiasis and chronic cholecystitis.  Procedure Details:  The patient was seen in the pre-op holding area. The risks, benefits, complications, treatment options, and expected outcomes were previously discussed with the patient. The patient agreed with the proposed plan and has signed the informed consent form.  The patient was transported to operating room #4 at the [facility]. The patient was placed in the supine position on the operating room table. Following induction of general anesthesia, the  abdomen was prepped and draped in the usual aseptic fashion.  An incision was made in the skin near the umbilicus. The midline fascia was incised and the peritoneal cavity was entered and a Hasson cannula was introduced under direct vision. The cannula was secured with a 0-Vicryl pursestring suture. Pneumoperitoneum was established with carbon dioxide. Additional cannulae were introduced under direct vision along the right costal margin in the midline, mid-clavicular line, and anterior axillary line.   The gallbladder was identified and the fundus grasped and retracted cephalad. Adhesions were taken down bluntly and the electrocautery was utilized as needed, taking care not to involve any adjacent structures. The infundibulum was grasped and retracted laterally, exposing the peritoneum overlying the triangle of Calot. The peritoneum was incised and structures exposed with blunt dissection. The cystic duct was clearly identified, bluntly dissected circumferentially, and clipped at the neck of the gallbladder.  An incision was made in the cystic duct and the cholangiogram catheter introduced. The catheter was secured using an ligaclip.  Real-time cholangiography was performed using C-arm fluoroscopy.  There was rapid filling of a normal caliber common bile duct.  There was reflux of contrast into the left and right hepatic ductal systems.  There was free flow distally into the duodenum without filling defect or obstruction.  The catheter was removed from the peritoneal cavity.  The cystic duct was then ligated with ligaclips and divided. The cystic artery was identified, dissected circumferentially, ligated with ligaclips, and divided.  The gallbladder was dissected away from the gallbladder bed using the electrocautery for hemostasis. The gallbladder was completely removed from the liver and placed into an endocatch bag. The gallbladder was removed in the endocatch bag through the umbilical port site and  submitted  to pathology for review.  The right upper quadrant was irrigated and the gallbladder bed was inspected. Hemostasis was achieved with the electrocautery.  Cannulae were removed under direct vision and good hemostasis was noted. Pneumoperitoneum was released and the majority of the carbon dioxide evacuated. The umbilical wound was irrigated and the fascia was then closed with the pursestring suture.  Local anesthetic was infiltrated at all port sites. Skin incisions were closed with 4-0 Monocril subcuticular sutures and Dermabond was applied.  Instrument, sponge, and needle counts were correct at the conclusion of the case.  The patient was awakened from anesthesia and brought to the recovery room in stable condition.  The patient tolerated the procedure well.   Darnell Level, MD [facility] Office: [phone]

## 2020-12-22 NOTE — Progress Notes (Signed)
Inpatient Diabetes Program Recommendations  AACE/ADA: New Consensus Statement on Inpatient Glycemic Control (2015)  Target Ranges:  Prepandial:   less than 140 mg/dL      Peak postprandial:   less than 180 mg/dL (1-2 hours)      Critically ill patients:  140 - 180 mg/dL   Lab Results  Component Value Date   GLUCAP 248 (H) 12/22/2020   HGBA1C 9.7 (A) 10/31/2019    Review of Glycemic Control  Diabetes history: DM 2 Outpatient Diabetes medications: Glipizide 5-10mg  Daily before supper, NPH 15 units qam, 10 units qpm Current orders for Inpatient glycemic control:  NPH 15 units qam, 10 units qpm  Decadron 4 mg Given during surgery  Inpatient Diabetes Program Recommendations:    -  Add Novolog 0-15 units tid + hs  Thanks,  Christena Deem RN, MSN, BC-ADM Inpatient Diabetes Coordinator Team Pager [phone] (8a-5p)

## 2020-12-23 ENCOUNTER — Encounter (HOSPITAL_COMMUNITY): Payer: Self-pay | Admitting: Surgery

## 2020-12-23 DIAGNOSIS — K8010 Calculus of gallbladder with chronic cholecystitis without obstruction: Secondary | ICD-10-CM | POA: Diagnosis not present

## 2020-12-23 LAB — SURGICAL PATHOLOGY

## 2020-12-23 LAB — HEMOGLOBIN A1C
Hgb A1c MFr Bld: 10.3 % — ABNORMAL HIGH (ref 4.8–5.6)
Mean Plasma Glucose: 249 mg/dL

## 2020-12-23 LAB — GLUCOSE, CAPILLARY: Glucose-Capillary: 173 mg/dL — ABNORMAL HIGH (ref 70–99)

## 2020-12-23 NOTE — Discharge Summary (Signed)
    Physician Discharge Summary [facility]  Patient ID: Misty Miller MRN: [id number] DOB/AGE: Sep 07, 1960 60 y.o.  Admit date: 12/22/2020  Discharge date: 12/23/2020  Discharge Diagnoses:  Principal Problem:   Cholelithiasis with chronic cholecystitis Active Problems:   Liver mass, left lobe   Discharged Condition: good  Hospital Course: Patient was admitted for observation following gallbladder surgery.  Post op course was uncomplicated.  Pain was well controlled.  Tolerated diet.  Patient was prepared for discharge home on POD#1.   Consults: None  Treatments: surgery: lap chole with IOC  Discharge Exam: Blood pressure (!) 148/80, pulse 83, temperature 98.3 F (36.8 C), temperature source Oral, resp. rate 17, height 5\' 2"  (1.575 m), weight 79.4 kg, SpO2 98 %. HEENT - clear Neck - soft Chest - clear bilaterally Cor - RRR Abd - soft without distension; wounds dry and intact with Dermabond in place  Disposition: Home  Discharge Instructions     Diet - low sodium heart healthy   Complete by: As directed    Increase activity slowly   Complete by: As directed    No dressing needed   Complete by: As directed          Follow-up Information     , MD. Schedule an appointment as soon as possible for a visit in 3 week(s).   Specialty: General Surgery Why: For wound re-check Contact information: [address] [place] [phone]                 [phone], MD [facility] Office: [phone]   Signed: [phone] 12/23/2020, 8:22 AM

## 2020-12-23 NOTE — Discharge Instructions (Signed)
[facility]  LAPAROSCOPIC SURGERY:  POST-OP INSTRUCTIONS  Always review your discharge instruction sheet given to you by the facility where your surgery was performed.  A prescription for pain medication may be given to you upon discharge.  Take your pain medication as prescribed.  If narcotic pain medicine is not needed, then you may take acetaminophen (Tylenol) or ibuprofen (Advil) as needed.  Take your usually prescribed medications unless otherwise directed.  If you need a refill on your pain medication, please contact your pharmacy.  They will contact our office to request authorization. Prescriptions will not be filled after 5 P.M. or on weekends.  You should follow a light diet the first few days after arrival home, such as soup and crackers or toast.  Be sure to include plenty of fluids daily.  Most patients will experience some swelling and bruising in the area of the incisions.  Ice packs will help.  Swelling and bruising can take several days to resolve.   It is common to experience some constipation after surgery.  Increasing fluid intake and taking a stool softener (such as Colace) will usually help or prevent this problem from occurring.  A mild laxative (Milk of Magnesia or Miralax) should be taken according to package instructions if there has been no bowel movement after 48 hours.  You will likely have Dermabond (topical glue) over your incisions.  This seals the incisions and allows you to bathe and shower at any time after your surgery.  Glue should remain in place for up to 10 days.  It may be removed after 10 days by pealing off the Dermabond material or using Vaseline or naval jelly to remove.  If you have steri-strips over your incisions, you may remove the gauze bandage on the second day after surgery, and you may shower at that time.  Leave your steri-strips (small skin tapes) in place directly over the incision.  These strips should remain on the  skin for 5-7 days and then be removed.  You may get them wet in the shower and pat them dry.  Any sutures or staples will be removed at the office during your follow-up visit.  ACTIVITIES:  You may resume regular (light) daily activities beginning the next day - such as daily self-care, walking, climbing stairs - gradually increasing activities as tolerated.  You may have sexual intercourse when it is comfortable.  Refrain from any heavy lifting or straining until approved by your doctor.  You may drive when you are no longer taking prescription pain medication, when you can comfortably wear a seatbelt, and when you can safely maneuver your car and apply brakes.  You should see your doctor in the office for a follow-up appointment approximately 2-3 weeks after your surgery.  Make sure that you call for this appointment within a day or two after you arrive home to insure a convenient appointment time.  WHEN TO CALL YOUR DOCTOR: Fever over 101.0 Inability to urinate Continued bleeding from incision Increased pain, redness, or drainage from the incision Increasing abdominal pain  The clinic staff is available to answer your questions during regular business hours.  Please don't hesitate to call and ask to speak to one of the nurses for clinical concerns.  If you have a medical emergency, go to the nearest emergency room or call 911.  A surgeon from [facility] is always on call for the hospital.  Marisabel Macpherson, MD [facility] Office: [phone] Toll Free:    [phone] FAX [phone]  Website: [email or web address] 

## 2020-12-23 NOTE — Progress Notes (Signed)
Patient was given discharge instructions, and all questions were answered.  Patient was stable for discharge and was taken to the main exit by wheelchair. 

## 2020-12-27 NOTE — Anesthesia Postprocedure Evaluation (Signed)
Anesthesia Post Note  Patient: [name]  Procedure(s) Performed: LAPAROSCOPIC CHOLECYSTECTOMY WITH INTRAOPERATIVE CHOLANGIOGRAM (Abdomen)     Patient location during evaluation: PACU Anesthesia Type: General Level of consciousness: awake and alert Pain management: pain level controlled Vital Signs Assessment: post-procedure vital signs reviewed and stable Respiratory status: spontaneous breathing, nonlabored ventilation, respiratory function stable and patient connected to nasal cannula oxygen Cardiovascular status: blood pressure returned to baseline and stable Postop Assessment: no apparent nausea or vomiting Anesthetic complications: no   No notable events documented.  Last Vitals:  Vitals:   12/23/20 0504 12/23/20 0930  BP: (!) 148/80 (!) 157/82  Pulse: 83 65  Resp: 17   Temp: 36.8 C 36.7 C  SpO2: 98% 100%    Last Pain:  Vitals:   12/23/20 0930  TempSrc: Oral  PainSc:                  Delynn Olvera

## 2021-01-01 ENCOUNTER — Ambulatory Visit
Admission: RE | Admit: 2021-01-01 | Discharge: 2021-01-01 | Disposition: A | Discharge status: Home / Self Care | Payer: 59 | Source: Ambulatory Visit | Attending: Surgery | Admitting: Surgery

## 2021-01-01 ENCOUNTER — Other Ambulatory Visit: Payer: Self-pay

## 2021-01-01 DIAGNOSIS — R160 Hepatomegaly, not elsewhere classified: Secondary | ICD-10-CM

## 2021-01-01 MED ORDER — GADOBENATE DIMEGLUMINE 529 MG/ML IV SOLN
15.0000 mL | Freq: Once | INTRAVENOUS | Status: AC | PRN
  Administered 2021-01-01: 15 mL via INTRAVENOUS

## 2021-01-27 ENCOUNTER — Other Ambulatory Visit: Payer: Self-pay

## 2021-01-27 ENCOUNTER — Ambulatory Visit (HOSPITAL_COMMUNITY)
Admission: RE | Admit: 2021-01-27 | Discharge: 2021-01-27 | Disposition: A | Discharge status: Home / Self Care | Payer: 59 | Source: Ambulatory Visit | Attending: Cardiology | Admitting: Cardiology

## 2021-01-27 ENCOUNTER — Encounter (HOSPITAL_COMMUNITY): Payer: Self-pay | Admitting: Cardiology

## 2021-01-27 VITALS — BP 138/80 | HR 76 | Wt 177.8 lb

## 2021-01-27 DIAGNOSIS — I5022 Chronic systolic (congestive) heart failure: Secondary | ICD-10-CM | POA: Diagnosis not present

## 2021-01-27 DIAGNOSIS — R9431 Abnormal electrocardiogram [ECG] [EKG]: Secondary | ICD-10-CM | POA: Insufficient documentation

## 2021-01-27 DIAGNOSIS — I5032 Chronic diastolic (congestive) heart failure: Secondary | ICD-10-CM | POA: Diagnosis not present

## 2021-01-27 LAB — BASIC METABOLIC PANEL
Anion gap: 8 (ref 5–15)
BUN: 23 mg/dL — ABNORMAL HIGH (ref 6–20)
CO2: 27 mmol/L (ref 22–32)
Calcium: 9.2 mg/dL (ref 8.9–10.3)
Chloride: 101 mmol/L (ref 98–111)
Creatinine, Ser: 1.19 mg/dL — ABNORMAL HIGH (ref 0.44–1.00)
GFR, Estimated: 52 mL/min — ABNORMAL LOW (ref >60)
Glucose, Bld: 323 mg/dL — ABNORMAL HIGH (ref 70–99)
Potassium: 4.1 mmol/L (ref 3.5–5.1)
Sodium: 136 mmol/L (ref 135–145)

## 2021-01-27 LAB — LIPID PANEL
Cholesterol: 231 mg/dL — ABNORMAL HIGH (ref 0–200)
HDL: 41 mg/dL (ref >40)
LDL Cholesterol: 151 mg/dL — ABNORMAL HIGH (ref 0–99)
Total CHOL/HDL Ratio: 5.6 RATIO
Triglycerides: 197 mg/dL — ABNORMAL HIGH (ref <150)
VLDL: 39 mg/dL (ref 0–40)

## 2021-01-27 MED ORDER — ISOSORBIDE MONONITRATE ER 60 MG PO TB24: 60.0000 mg | ORAL_TABLET | Freq: Every day | ORAL | 3 refills | Status: DC

## 2021-01-27 MED ORDER — CARVEDILOL 25 MG PO TABS: 25.0000 mg | ORAL_TABLET | Freq: Two times a day (BID) | ORAL | 3 refills | Status: DC

## 2021-01-27 MED ORDER — SPIRONOLACTONE 50 MG PO TABS: 50.0000 mg | ORAL_TABLET | Freq: Every day | ORAL | 3 refills | Status: DC

## 2021-01-27 MED ORDER — HYDRALAZINE HCL 100 MG PO TABS: 100.0000 mg | ORAL_TABLET | Freq: Three times a day (TID) | ORAL | 3 refills | Status: DC

## 2021-01-27 MED ORDER — ASPIRIN 81 MG PO TBEC: 81.0000 mg | DELAYED_RELEASE_TABLET | Freq: Every day | ORAL | 3 refills | Status: AC

## 2021-01-27 MED ORDER — ATORVASTATIN CALCIUM 80 MG PO TABS: 80.0000 mg | ORAL_TABLET | Freq: Every day | ORAL | 3 refills | Status: DC

## 2021-01-27 MED ORDER — FUROSEMIDE 40 MG PO TABS: 40.0000 mg | ORAL_TABLET | Freq: Every day | ORAL | 3 refills | Status: DC

## 2021-01-27 NOTE — Patient Instructions (Signed)
EKG done today.  Labs done today. We will contact you only if your labs are abnormal.  Your medications have been refilled.  No medication changes were made. Please continue all current medications as prescribed.  Your physician recommends that you schedule a follow-up appointment in: 6 months with our [facility] here in our office. Please contact our office in December to schedule a January appointment.    If you have any questions or concerns before your next appointment please send [place] a message through [facility] or call our office at [phone].    TO LEAVE A MESSAGE FOR THE NURSE SELECT OPTION 2, PLEASE LEAVE A MESSAGE INCLUDING: YOUR NAME DATE OF BIRTH CALL BACK NUMBER REASON FOR CALL**this is important as we prioritize the call backs  YOU WILL RECEIVE A CALL BACK THE SAME DAY AS LONG AS YOU CALL BEFORE 4:00 PM   Do the following things EVERYDAY: Weigh yourself in the morning before breakfast. Write it down and keep it in a log. Take your medicines as prescribed Eat low salt foods--Limit salt (sodium) to 2000 mg per day.  Stay as active as you can everyday Limit all fluids for the day to less than 2 liters   At the [facility], you and your health needs are our priority. As part of our continuing mission to provide you with exceptional heart care, we have created designated Provider Care Teams. These Care Teams include your primary Cardiologist (physician) and Advanced Practice Providers (APPs- Physician Assistants and Nurse Practitioners) who all work together to provide you with the care you need, when you need it.   You may see any of the following providers on your designated Care Team at your next follow up: Dr Arvilla Meres Dr Carron Curie, NP Robbie Lis, [place] Karle Plumber, PharmD   Please be sure to bring in all your medications bottles to every appointment.

## 2021-01-28 NOTE — Progress Notes (Signed)
Date:  01/28/2021   ID:  Houa, Nie Sep 11, 1960, MRN [id number]  Provider location: [address] [place] Type of Visit: Established patient  PCP:  Pcp, No  Cardiologist:  None Primary HF: Dr. Shirlee Latch   History of Present Illness: Misty Miller is a 60 y.o. female who has poorly controlled HTN and CHF.  She has had HTN and diabetes for years.  She was [facility] to [facility] in 2/16 with a hypertensive emergency and flash pulmonary edema. She had been off irbesartan for months.  She was initially given IV Lasix (just one dose) and started back on irbesartan.  Creatinine rose from 0.95 at admission to 2.71.  Irbesartan and Lasix were stopped.  Echo was done, showing EF 35-40% with basal inferior akinesis and mild to moderate MR.  Troponin was mildly elevated to peak 0.35.  Due to elevated creatinine, she did not have cardiac cath initially.  Lexiscan Cardiolite was done, showing EF 35% but no ischemia or infarction.  V/Q scan was normal.  BP was controlled and she was sent home. In 6/16, she finally had cardiac cath showing severe RCA stenosis that was treated with DES. Echo in 5/17 showed improvement in EF back to 55%, echo in 5/18 with EF 55-60%.  Echo in 12/21 showed EF 55-60%, moderate focal basal septal hypertrophy, mild concentric LVH, normal RV, mild MR.    She returns for followup of CHF and HTN.  BP has been fairly well-controlled recently.  No significant exertional dyspnea.  No chest pain.  No orthopnea/PND.  Weight is down 5 lbs.  She is using CPAP.   ECG (personally reviewed): NSR, septal Qs     Labs (2/16): K 4.3, creatinine 0.95 => 2.71 => 1.78, TnI 0.35, Hgb 10, LDL 149, HCT 31, TSH normal, plasma aldosterone 1, urine catecholeamines normal.  Labs (3/16): K 4, creatinine 1.11 Labs (4/16): K 3.9, creatinine 1.09 Labs (12/24/14): K 4.1, creatinine 1.08, P2Y12 261 Labs 02/23/2015: K 3.6 Creatinine 0.90, HCT 35.4, BNP 18 Labs (10/16): K 4.1, creatinine  1.03, BNP 14 Labs (2/18): Creatinine 1.09, K 4.1 Labs (7/18): K 4, creatinine 1.15 Labs (8/19): LDL 89, K 3.6, creatinine 8.67 Labs (2/20): LDL 85, HDL 48, K 3.8, creatinine 6.19 Labs (8/20): LDL 78, HDL 43 Labs (9/20): K 4.2, creatinine 1.09 Labs (6/22): K 4, creatinine 0.95   PMH: 1. HTN: For > 18 yrs, poor control historically. Renal artery dopplers showed no renal artery stenosis (2/16).   2. Type II diabetes. 3. 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  Admitted 2/16 with hypertensive emergency and pulmonary edema.  Echo (2/16) with EF 35-40%, basal inferior akinesis, mild-moderate MR.  Lexiscan Cardiolite (2/16) with EF 35%, no ischemia or infarction but LHC showed severe RCA disease, as below.    - Echo (5/17): EF 55%, mild LVH.  - Echo (5/18): EF 55-60%, mild MR - Echo (12/21): EF 55-60%, moderate focal basal septal hypertrophy, mild concentric LVH, normal RV, mild MR.  4. Hyperlipidemia 5. AKI/CKD: AKI in 2/16 in the setting of ARB and diuresis.  6. CAD: LHC (6/16) with 90% proximal RCA, 50% mRCA, 60% dRCA.  Promus DES to proximal RCA.  7. OSA: Moderate, using CPAP. 8. Inadequate platelet inhibition with Plavix.  9. Palpitations: Holter in 4/16 showed primarily NSR, rare PVCs, with 10 beat run of AIVR. 10. Cholecystectomy 6/22.   Current Outpatient Medications  Medication Sig Dispense Refill   glipiZIDE (  GLUCOTROL) 5 MG tablet Take 1-2 tablets (5-10 mg total) by mouth daily before supper. 90 tablet 3   glucose blood test strip Use 2x a day - verio 200 each 3   NOVOLIN N RELION 100 UNIT/ML injection INJECT 15 UNITS IN THE MORNING AND 10 UNITS AT BEDTIME UNDER THE SKIN (Patient taking differently: Inject 10-15 Units into the skin See admin instructions. Inject 15 units in the morning and 10 units at bedtime) 6 mL 0   omeprazole (PRILOSEC) 20 MG capsule Take 1 capsule (20 mg total) by mouth 2 (two) times daily before a meal. Take the pill 30  minutes before breakfast and dinner 60 capsule 0   OneTouch Delica Lancets 33G MISC Use 2x a day 200 each 3   aspirin 81 MG EC tablet Take 1 tablet (81 mg total) by mouth daily. 90 tablet 3   atorvastatin (LIPITOR) 80 MG tablet Take 1 tablet (80 mg total) by mouth daily at 6 PM. 90 tablet 3   carvedilol (COREG) 25 MG tablet Take 1 tablet (25 mg total) by mouth 2 (two) times daily with a meal. 180 tablet 3   furosemide (LASIX) 40 MG tablet Take 1 tablet (40 mg total) by mouth daily. 90 tablet 3   hydrALAZINE (APRESOLINE) 100 MG tablet Take 1 tablet (100 mg total) by mouth 3 (three) times daily. 90 tablet 3   isosorbide mononitrate (IMDUR) 60 MG 24 hr tablet Take 1 tablet (60 mg total) by mouth daily. 90 tablet 3   spironolactone (ALDACTONE) 50 MG tablet Take 1 tablet (50 mg total) by mouth daily. 90 tablet 3   No current facility-administered medications for this encounter.    Allergies:   Patient has no known allergies.   Social History:  The patient  reports that she quit smoking about 22 years ago. Her smoking use included cigarettes. She has a 5.00 pack-year smoking history. She has never used smokeless tobacco. She reports current alcohol use. She reports that she does not use drugs.   Family History:  The patient's family history includes Cancer in her mother.   ROS:  Please see the history of present illness.   All other systems are personally reviewed and negative.   Exam:   BP 138/80   Pulse 76   Wt 80.6 kg (177 lb 12.8 oz)   LMP  (LMP Unknown)   SpO2 98%   BMI 32.52 kg/m  General: NAD Neck: No JVD, no thyromegaly or thyroid nodule.  Lungs: Clear to auscultation bilaterally with normal respiratory effort. CV: Nondisplaced PMI.  Heart regular S1/S2, no S3/S4, no murmur.  No peripheral edema.  No carotid bruit.  Normal pedal pulses.  Abdomen: Soft, nontender, no hepatosplenomegaly, no distention.  Skin: Intact without lesions or rashes.  Neurologic: Alert and oriented x 3.   Psych: Normal affect. Extremities: No clubbing or cyanosis.  HEENT: Normal.    Recent Labs: 10/25/2020: ALT 11 12/22/2020: Hemoglobin 12.1; Platelets 234 01/27/2021: BUN 23; Creatinine, Ser 1.19; Potassium 4.1; Sodium 136  Personally reviewed   Wt Readings from Last 3 Encounters:  01/27/21 80.6 kg (177 lb 12.8 oz)  12/22/20 79.4 kg (175 lb)  10/25/20 77.1 kg (170 lb)     ASSESSMENT AND PLAN:  1. Chronic systolic => diastolic CHF: EF 63-14% by echo 08/17/14.  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  HTN has been treated and she is s/p DES to RCA, and EF on 5/17 echo was improved back to 55%.  Echo in  5/18 was similar with EF 55-60%, and echo in 12/21 showed EF 55-60% with moderate focal basal septal hypertrophy. She is not volume overloaded on exam.  NYHA class I-II.  - Continue Coreg 25 mg bid.   - Continue spironolactone 50 mg daily. BMET today.    - Continue hydralazine to 75 mg tid and Imdur to 60 mg daily.  - Continue Lasix 40 mg daily.  2. CAD: LHC 12/23/14 showed severe proximal RCA stenosis treated with Promus DES.  She was a Plavix non-responder so was transitioned to Brilinta 90 mg bid => now off Brilinta.  No chest pain.  - Continue ASA 81.   - Check lipids, need to make sure she is taking atorvastatin 80 mg daily.  3. HTN: She had marked AKI with ARB use in the past. BP is relatively well-controlled at this point.   4. CKD: AKI in past after starting ARB, but fully recovered.  No evidence for renal artery stenosis on renal artery dopplers.  Suspect some baseline renal damage from long-standing HTN and diabetes but recent creatinine within normal range.    - BMET today.  5. OSA: Continue CPAP.  6. Diabetes: She has followup with endocrinology.    Followup in 6 months with APP.   Signed, [name], MD  01/28/2021  [facility] [address] [facility] [place] [address] [phone] (office) [phone]  (fax)

## 2021-02-03 ENCOUNTER — Telehealth (HOSPITAL_COMMUNITY): Payer: Self-pay

## 2021-02-03 DIAGNOSIS — I2510 Atherosclerotic heart disease of native coronary artery without angina pectoris: Secondary | ICD-10-CM

## 2021-02-03 NOTE — Telephone Encounter (Signed)
-----   Message from Laurey Morale, MD sent at 01/28/2021  4:24 PM EDT ----- LDL is way too high.  It looks like she is probably off atorvastatin.  She needs to restart atorvastatin 80 mg daily.  Lipids/LFTs in 2 months.

## 2021-02-03 NOTE — Telephone Encounter (Signed)
Patient advised and verbalized understanding,lab appointment scheduled,lab orders entered. Med list updated to reflect changes.   Orders Placed This Encounter  Procedures   Lipid panel    Standing Status:   Future    Standing Expiration Date:   02/03/2022    Order Specific Question:   Release to patient    Answer:   Immediate   Hepatic function panel    Standing Status:   Future    Standing Expiration Date:   02/03/2022    Order Specific Question:   Release to patient    Answer:   Immediate

## 2021-04-09 ENCOUNTER — Other Ambulatory Visit (HOSPITAL_COMMUNITY): Payer: 59

## 2021-04-16 ENCOUNTER — Other Ambulatory Visit (HOSPITAL_COMMUNITY): Payer: 59

## 2021-07-21 ENCOUNTER — Telehealth (HOSPITAL_COMMUNITY): Payer: Self-pay

## 2021-07-21 NOTE — Telephone Encounter (Signed)
Called to confirm/remind patient of their appointment at the [facility] on 05/12/22.   Patient reminded to bring all medications and/or complete list.  Confirmed patient has transportation. Gave directions, instructed to utilize valet parking.  Confirmed appointment prior to ending call.   

## 2021-07-21 NOTE — Progress Notes (Addendum)
Date:  07/22/2021   ID:  Sherral Hammers, DOB 1961/04/04, MRN [id number]  Type of Visit: Established patient  PCP:  Pcp, No  Cardiologist:  None Primary HF: Dr. Aundra Dubin   History of Present Illness: Misty Miller is a 61 y.o. female who has poorly controlled HTN and CHF.  She has had HTN and diabetes for years.  She was admitted to [facility] in 2/16 with a hypertensive emergency and flash pulmonary edema. She had been off irbesartan for months.  She was initially given IV Lasix (just one dose) and started back on irbesartan.  Creatinine rose from 0.95 at admission to 2.71.  Irbesartan and Lasix were stopped.  Echo demonstrated EF 35-40% with basal inferior akinesis and mild to moderate MR.  Troponin was mildly elevated to peak 0.35.  Due to elevated creatinine, she did not have cardiac cath initially.  Lexiscan Cardiolite with EF 35% but no ischemia or infarction.  V/Q scan was normal.  BP was controlled and she was sent home. In 6/16, she finally had cardiac cath showing severe RCA stenosis that was treated with DES. Echo in 5/17 showed improvement in EF back to 55%, echo in 5/18 with EF 55-60%.   Echo in 12/21 showed EF 55-60%, moderate focal basal septal hypertrophy, mild concentric LVH, normal RV, mild MR.   Last seen for f/u July 2022. Volume appeared stable. BP controlled. No changes made.   Here today for routine f/u. She has been feeling well. No chest pain or shortness of breath. She is walking with her daughter at the park 3 nights a week. No complaints of orthopnea, PND or leg edema. Monitoring sodium intake. She has been out of her statin, carvedilol, lasix, hydralazine, imdur and spiro for several days. Lots of stress at work. Yesterday her blood pressure was 165/99.  No tobacco use, rare alcohol.  Planning to establish with PCP at [facility] in [facility].  ECG (personally reviewed): NSR, septal Qs     Labs (2/16): K 4.3, creatinine 0.95 => 2.71 => 1.78, TnI 0.35, Hgb 10,  LDL 149, HCT 31, TSH normal, plasma aldosterone 1, urine catecholeamines normal.  Labs (3/16): K 4, creatinine 1.11 Labs (4/16): K 3.9, creatinine 1.09 Labs (12/24/14): K 4.1, creatinine 1.08, P2Y12 261 Labs 02/23/2015: K 3.6 Creatinine 0.90, HCT 35.4, BNP 18 Labs (10/16): K 4.1, creatinine 1.03, BNP 14 Labs (2/18): Creatinine 1.09, K 4.1 Labs (7/18): K 4, creatinine 1.15 Labs (8/19): LDL 89, K 3.6, creatinine 0.98 Labs (2/20): LDL 85, HDL 48, K 3.8, creatinine 0.99 Labs (8/20): LDL 78, HDL 43 Labs (9/20): K 4.2, creatinine 1.09 Labs (6/22): K 4, creatinine 0.95   PMH: 1. HTN: For > 18 yrs, poor control historically. Renal artery dopplers showed no renal artery stenosis (2/16).   2. Type II diabetes. 3. 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  Admitted 2/16 with hypertensive emergency and pulmonary edema.  Echo (2/16) with EF 35-40%, basal inferior akinesis, mild-moderate MR.  Lexiscan Cardiolite (2/16) with EF 35%, no ischemia or infarction but LHC showed severe RCA disease, as below.    - Echo (5/17): EF 55%, mild LVH.  - Echo (5/18): EF 55-60%, mild MR - Echo (12/21): EF 55-60%, moderate focal basal septal hypertrophy, mild concentric LVH, normal RV, mild MR.  4. Hyperlipidemia 5. AKI/CKD: AKI in 2/16 in the setting of ARB and diuresis.  6. CAD: LHC (6/16) with 90% proximal RCA, 50% mRCA, 60% dRCA.  Promus DES to proximal RCA.  7. OSA: Moderate, using CPAP. 8. Inadequate platelet inhibition with Plavix.  9. Palpitations: Holter in 4/16 showed primarily NSR, rare PVCs, with 10 beat run of AIVR. 10. Cholecystectomy 6/22.   Current Outpatient Medications  Medication Sig Dispense Refill   aspirin 81 MG EC tablet Take 1 tablet (81 mg total) by mouth daily. 90 tablet 3   glucose blood test strip Use 2x a day - verio 200 each 3   atorvastatin (LIPITOR) 80 MG tablet Take 1 tablet (80 mg total) by mouth daily at 6 PM. 90 tablet 3   carvedilol (COREG) 25  MG tablet Take 1 tablet (25 mg total) by mouth 2 (two) times daily with a meal. 180 tablet 3   furosemide (LASIX) 40 MG tablet Take 1 tablet (40 mg total) by mouth daily. 90 tablet 3   hydrALAZINE (APRESOLINE) 100 MG tablet Take 1 tablet (100 mg total) by mouth 3 (three) times daily. 90 tablet 3   isosorbide mononitrate (IMDUR) 60 MG 24 hr tablet Take 1 tablet (60 mg total) by mouth daily. 90 tablet 3   OneTouch Delica Lancets 99991111 MISC Use 2x a day (Patient not taking: Reported on 07/22/2021) 200 each 3   spironolactone (ALDACTONE) 50 MG tablet Take 1 tablet (50 mg total) by mouth daily. 90 tablet 3   No current facility-administered medications for this encounter.    Allergies:   Patient has no known allergies.   Social History:  The patient  reports that she quit smoking about 22 years ago. Her smoking use included cigarettes. She has a 5.00 pack-year smoking history. She has never used smokeless tobacco. She reports current alcohol use. She reports that she does not use drugs.   Family History:  The patient's family history includes Cancer in her mother.   ROS:  Please see the history of present illness.   All other systems are personally reviewed and negative.   Exam:   BP (!) 182/98    Pulse 69    Wt 77.6 kg (171 lb)    LMP  (LMP Unknown)    SpO2 98%    BMI 31.28 kg/m  General:  Well appearing. No resp difficulty HEENT: normal Neck: supple. no JVD. Carotids 2+ bilat; no bruits. No lymphadenopathy or thryomegaly appreciated. Cor: PMI nondisplaced. Regular rate & rhythm. No rubs, gallops or murmurs. Lungs: clear Abdomen: soft, nontender, nondistended. No hepatosplenomegaly. No bruits or masses. Good bowel sounds. Extremities: no cyanosis, clubbing, rash, edema Neuro: alert & orientedx3, cranial nerves grossly intact. moves all 4 extremities w/o difficulty. Affect pleasant   Recent Labs: 10/25/2020: ALT 11 12/22/2020: Hemoglobin 12.1; Platelets 234 01/27/2021: BUN 23; Creatinine, Ser  1.19; Potassium 4.1; Sodium 136  Personally reviewed   Wt Readings from Last 3 Encounters:  07/22/21 77.6 kg (171 lb)  01/27/21 80.6 kg (177 lb 12.8 oz)  12/22/20 79.4 kg (175 lb)     ASSESSMENT AND PLAN:  1. Chronic systolic => diastolic CHF: EF 123456 by echo 08/17/14.  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  HTN has been treated and she is s/p DES to RCA, and EF on 5/17 echo was improved back to 55%.  Echo in 5/18 was similar with EF 55-60%, and echo in 12/21 showed EF 55-60% with moderate focal basal septal hypertrophy.  - NYHA I. Volume stable. On lasix 40 mg daily - Refilled her medications including lasix, coreg 25 mg BID, spironolactone 50 mg daily, hydralazine 100 mg TID, imdur  60 mg daily - BP significantly elevated. Stressed importance of better adherence with medical therapy. - BMET today 2. CAD: LHC 12/23/14 showed severe proximal RCA stenosis treated with Promus DES.  She was a Plavix non-responder so was transitioned to Brilinta 90 mg bid => now off Brilinta.  No chest pain.  - Continue ASA 81.   - Last LDL was 151 in July 2022. Suspected she had been off Atorvastatin at that time - Currently out of Atorvastatin. Refilled.  - Repeat lipid panel in 6-8 weeks 3. HTN: She had marked AKI with ARB use in the past.  - BP significantly elevated. Likely combination of being off mediations and stress. Medications refilled. She will monitor BP at home - reiterated importance of medication compliance as above   4. CKD IIIa: AKI in past after starting ARB, but fully recovered.  No evidence for renal artery stenosis on renal artery dopplers.  Suspect some baseline renal damage from long-standing HTN and diabetes but recent creatinine within normal range.    - BMET today.  5. OSA: Continue CPAP.  6. Diabetes: Uncontrolled. - A1c 10.3 in June 2022 - Previously followed with Endocrine. She is no longer taking glipizide and novolin.  - Reports she is establishing with  PCP this week at [place]: 4 weeks in APP clinic to assess need for further medication titration. If stable at that time, can likely follow-up in 6 months  Signed, Leata Mouse, PA-C  07/22/2021  [facility] [place] [facility] [place] [address] [phone] (office) [phone] (fax)

## 2021-07-22 ENCOUNTER — Ambulatory Visit (HOSPITAL_COMMUNITY)
Admission: RE | Admit: 2021-07-22 | Discharge: 2021-07-22 | Disposition: A | Discharge status: Home / Self Care | Payer: 59 | Source: Ambulatory Visit | Attending: Physician Assistant | Admitting: Physician Assistant

## 2021-07-22 ENCOUNTER — Other Ambulatory Visit: Payer: Self-pay

## 2021-07-22 ENCOUNTER — Encounter (HOSPITAL_COMMUNITY): Payer: Self-pay

## 2021-07-22 VITALS — BP 182/98 | HR 69 | Wt 171.0 lb

## 2021-07-22 DIAGNOSIS — Z7982 Long term (current) use of aspirin: Secondary | ICD-10-CM | POA: Diagnosis not present

## 2021-07-22 DIAGNOSIS — Z79899 Other long term (current) drug therapy: Secondary | ICD-10-CM | POA: Diagnosis not present

## 2021-07-22 DIAGNOSIS — G4733 Obstructive sleep apnea (adult) (pediatric): Secondary | ICD-10-CM | POA: Diagnosis not present

## 2021-07-22 DIAGNOSIS — E1122 Type 2 diabetes mellitus with diabetic chronic kidney disease: Secondary | ICD-10-CM | POA: Insufficient documentation

## 2021-07-22 DIAGNOSIS — I5042 Chronic combined systolic (congestive) and diastolic (congestive) heart failure: Secondary | ICD-10-CM | POA: Insufficient documentation

## 2021-07-22 DIAGNOSIS — Z87891 Personal history of nicotine dependence: Secondary | ICD-10-CM | POA: Insufficient documentation

## 2021-07-22 DIAGNOSIS — E1165 Type 2 diabetes mellitus with hyperglycemia: Secondary | ICD-10-CM | POA: Diagnosis not present

## 2021-07-22 DIAGNOSIS — I10 Essential (primary) hypertension: Secondary | ICD-10-CM

## 2021-07-22 DIAGNOSIS — Z955 Presence of coronary angioplasty implant and graft: Secondary | ICD-10-CM | POA: Insufficient documentation

## 2021-07-22 DIAGNOSIS — I130 Hypertensive heart and chronic kidney disease with heart failure and stage 1 through stage 4 chronic kidney disease, or unspecified chronic kidney disease: Secondary | ICD-10-CM | POA: Diagnosis not present

## 2021-07-22 DIAGNOSIS — N1831 Chronic kidney disease, stage 3a: Secondary | ICD-10-CM | POA: Insufficient documentation

## 2021-07-22 DIAGNOSIS — I2510 Atherosclerotic heart disease of native coronary artery without angina pectoris: Secondary | ICD-10-CM | POA: Diagnosis not present

## 2021-07-22 DIAGNOSIS — I5032 Chronic diastolic (congestive) heart failure: Secondary | ICD-10-CM

## 2021-07-22 LAB — BASIC METABOLIC PANEL
Anion gap: 12 (ref 5–15)
BUN: 22 mg/dL — ABNORMAL HIGH (ref 6–20)
CO2: 29 mmol/L (ref 22–32)
Calcium: 9.9 mg/dL (ref 8.9–10.3)
Chloride: 102 mmol/L (ref 98–111)
Creatinine, Ser: 1.05 mg/dL — ABNORMAL HIGH (ref 0.44–1.00)
GFR, Estimated: >60 mL/min (ref >60)
Glucose, Bld: 148 mg/dL — ABNORMAL HIGH (ref 70–99)
Potassium: 4 mmol/L (ref 3.5–5.1)
Sodium: 143 mmol/L (ref 135–145)

## 2021-07-22 MED ORDER — CARVEDILOL 25 MG PO TABS: 25.0000 mg | ORAL_TABLET | Freq: Two times a day (BID) | ORAL | 3 refills | Status: AC

## 2021-07-22 MED ORDER — ISOSORBIDE MONONITRATE ER 60 MG PO TB24: 60.0000 mg | ORAL_TABLET | Freq: Every day | ORAL | 3 refills | Status: AC

## 2021-07-22 MED ORDER — FUROSEMIDE 40 MG PO TABS: 40.0000 mg | ORAL_TABLET | Freq: Every day | ORAL | 3 refills | Status: DC

## 2021-07-22 MED ORDER — SPIRONOLACTONE 50 MG PO TABS: 50.0000 mg | ORAL_TABLET | Freq: Every day | ORAL | 3 refills | Status: AC

## 2021-07-22 MED ORDER — FUROSEMIDE 40 MG PO TABS: 40.0000 mg | ORAL_TABLET | Freq: Every day | ORAL | 3 refills | Status: AC

## 2021-07-22 MED ORDER — ATORVASTATIN CALCIUM 80 MG PO TABS: 80.0000 mg | ORAL_TABLET | Freq: Every day | ORAL | 3 refills | Status: AC

## 2021-07-22 MED ORDER — HYDRALAZINE HCL 100 MG PO TABS: 100.0000 mg | ORAL_TABLET | Freq: Three times a day (TID) | ORAL | 3 refills | Status: AC

## 2021-07-22 NOTE — Addendum Note (Signed)
Encounter addended by: Andrey Farmer, PA-C on: 07/22/2021 3:21 PM  Actions taken: Order Reconciliation Section accessed

## 2021-07-22 NOTE — Patient Instructions (Signed)
It was great to see you today! No medication changes are needed at this time.  Labs today We will only contact you if something comes back abnormal or we need to make some changes. Otherwise no news is good news!  Your physician recommends that you schedule a follow-up appointment in: 4 weeks  in the Advanced Practitioners (PA/NP) Clinic     Do the following things EVERYDAY: Weigh yourself in the morning before breakfast. Write it down and keep it in a log. Take your medicines as prescribed Eat low salt foods--Limit salt (sodium) to 2000 mg per day.  Stay as active as you can everyday Limit all fluids for the day to less than 2 liters  At the [facility], you and your health needs are our priority. As part of our continuing mission to provide you with exceptional heart care, we have created designated Provider Care Teams. These Care Teams include your primary Cardiologist (physician) and Advanced Practice Providers (APPs- Physician Assistants and Nurse Practitioners) who all work together to provide you with the care you need, when you need it.   You may see any of the following providers on your designated Care Team at your next follow up: Dr Arvilla Meres Dr Carron Curie, NP Robbie Lis, [place] [facility] [place], [place] Karle Plumber, PharmD   Please be sure to bring in all your medications bottles to every appointment.     If you have any questions or concerns before your next appointment please send [place] a message through [place] or call our office at [phone].    TO LEAVE A MESSAGE FOR THE NURSE SELECT OPTION 2, PLEASE LEAVE A MESSAGE INCLUDING: YOUR NAME DATE OF BIRTH CALL BACK NUMBER REASON FOR CALL**this is important as we prioritize the call backs  YOU WILL RECEIVE A CALL BACK THE SAME DAY AS LONG AS YOU CALL BEFORE 4:00 PM

## 2021-08-17 NOTE — Progress Notes (Incomplete)
Date:  08/17/2021   ID:  Misty Miller, DOB 11/08/1960, MRN [id number]  Type of Visit: Established patient  PCP:  Pcp, No  Cardiologist:  None Primary HF: Dr. Shirlee Latch   History of Present Illness: Misty Miller is a 61 y.o. female who has poorly controlled HTN and CHF.  She has had HTN and diabetes for years.  She was admitted to [facility] in 2/16 with a hypertensive emergency and flash pulmonary edema. She had been off irbesartan for months.  She was initially given IV Lasix (just one dose) and started back on irbesartan.  Creatinine rose from 0.95 at admission to 2.71.  Irbesartan and Lasix were stopped.  Echo demonstrated EF 35-40% with basal inferior akinesis and mild to moderate MR.  Troponin was mildly elevated to peak 0.35.  Due to elevated creatinine, she did not have cardiac cath initially.  Lexiscan Cardiolite with EF 35% but no ischemia or infarction.  V/Q scan was normal.  BP was controlled and she was sent home. In 6/16, she finally had cardiac cath showing severe RCA stenosis that was treated with DES. Echo in 5/17 showed improvement in EF back to 55%, echo in 5/18 with EF 55-60%.   Echo in 12/21 showed EF 55-60%, moderate focal basal septal hypertrophy, mild concentric LVH, normal RV, mild MR.   Last seen for f/u July 2022. Volume appeared stable. BP controlled. No changes made.   Here today for routine f/u. She has been feeling well. No chest pain or shortness of breath. She is walking with her daughter at the park 3 nights a week. No complaints of orthopnea, PND or leg edema. Monitoring sodium intake. She has been out of her statin, carvedilol, lasix, hydralazine, imdur and spiro for several days. Lots of stress at work. Yesterday her blood pressure was 165/99.  No tobacco use, rare alcohol.  Planning to establish with PCP at [facility] in [facility].  ECG (personally reviewed): NSR, septal Qs     Labs (2/16): K 4.3, creatinine 0.95 => 2.71 => 1.78, TnI 0.35, Hgb 10,  LDL 149, HCT 31, TSH normal, plasma aldosterone 1, urine catecholeamines normal.  Labs (3/16): K 4, creatinine 1.11 Labs (4/16): K 3.9, creatinine 1.09 Labs (12/24/14): K 4.1, creatinine 1.08, P2Y12 261 Labs 02/23/2015: K 3.6 Creatinine 0.90, HCT 35.4, BNP 18 Labs (10/16): K 4.1, creatinine 1.03, BNP 14 Labs (2/18): Creatinine 1.09, K 4.1 Labs (7/18): K 4, creatinine 1.15 Labs (8/19): LDL 89, K 3.6, creatinine 0.45 Labs (2/20): LDL 85, HDL 48, K 3.8, creatinine 9.97 Labs (8/20): LDL 78, HDL 43 Labs (9/20): K 4.2, creatinine 1.09 Labs (6/22): K 4, creatinine 0.95   PMH: 1. HTN: For > 18 yrs, poor control historically. Renal artery dopplers showed no renal artery stenosis (2/16).   2. Type II diabetes. 3. 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  Admitted 2/16 with hypertensive emergency and pulmonary edema.  Echo (2/16) with EF 35-40%, basal inferior akinesis, mild-moderate MR.  Lexiscan Cardiolite (2/16) with EF 35%, no ischemia or infarction but LHC showed severe RCA disease, as below.    - Echo (5/17): EF 55%, mild LVH.  - Echo (5/18): EF 55-60%, mild MR - Echo (12/21): EF 55-60%, moderate focal basal septal hypertrophy, mild concentric LVH, normal RV, mild MR.  4. Hyperlipidemia 5. AKI/CKD: AKI in 2/16 in the setting of ARB and diuresis.  6. CAD: LHC (6/16) with 90% proximal RCA, 50% mRCA, 60% dRCA.  Promus DES to proximal RCA.  7. OSA: Moderate, using CPAP. 8. Inadequate platelet inhibition with Plavix.  9. Palpitations: Holter in 4/16 showed primarily NSR, rare PVCs, with 10 beat run of AIVR. 10. Cholecystectomy 6/22.   Current Outpatient Medications  Medication Sig Dispense Refill   aspirin 81 MG EC tablet Take 1 tablet (81 mg total) by mouth daily. 90 tablet 3   atorvastatin (LIPITOR) 80 MG tablet Take 1 tablet (80 mg total) by mouth daily at 6 PM. 90 tablet 3   carvedilol (COREG) 25 MG tablet Take 1 tablet (25 mg total) by mouth 2 (two)  times daily with a meal. 180 tablet 3   furosemide (LASIX) 40 MG tablet Take 1 tablet (40 mg total) by mouth daily. 90 tablet 3   glucose blood test strip Use 2x a day - verio 200 each 3   hydrALAZINE (APRESOLINE) 100 MG tablet Take 1 tablet (100 mg total) by mouth 3 (three) times daily. 90 tablet 3   isosorbide mononitrate (IMDUR) 60 MG 24 hr tablet Take 1 tablet (60 mg total) by mouth daily. 90 tablet 3   OneTouch Delica Lancets 33G MISC Use 2x a day (Patient not taking: Reported on 07/22/2021) 200 each 3   spironolactone (ALDACTONE) 50 MG tablet Take 1 tablet (50 mg total) by mouth daily. 90 tablet 3   No current facility-administered medications for this visit.    Allergies:   Patient has no known allergies.   Social History:  The patient  reports that she quit smoking about 22 years ago. Her smoking use included cigarettes. She has a 5.00 pack-year smoking history. She has never used smokeless tobacco. She reports current alcohol use. She reports that she does not use drugs.   Family History:  The patient's family history includes Cancer in her mother.   ROS:  Please see the history of present illness.   All other systems are personally reviewed and negative.   Exam:   LMP  (LMP Unknown)  General:  Well appearing. No resp difficulty HEENT: normal Neck: supple. no JVD. Carotids 2+ bilat; no bruits. No lymphadenopathy or thryomegaly appreciated. Cor: PMI nondisplaced. Regular rate & rhythm. No rubs, gallops or murmurs. Lungs: clear Abdomen: soft, nontender, nondistended. No hepatosplenomegaly. No bruits or masses. Good bowel sounds. Extremities: no cyanosis, clubbing, rash, edema Neuro: alert & orientedx3, cranial nerves grossly intact. moves all 4 extremities w/o difficulty. Affect pleasant   Recent Labs: 10/25/2020: ALT 11 12/22/2020: Hemoglobin 12.1; Platelets 234 07/22/2021: BUN 22; Creatinine, Ser 1.05; Potassium 4.0; Sodium 143  Personally reviewed   Wt Readings from Last 3  Encounters:  07/22/21 77.6 kg (171 lb)  01/27/21 80.6 kg (177 lb 12.8 oz)  12/22/20 79.4 kg (175 lb)     ASSESSMENT AND PLAN:  1. Chronic systolic => diastolic CHF: EF 09-81% by echo 08/17/14.  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  HTN has been treated and she is s/p DES to RCA, and EF on 5/17 echo was improved back to 55%.  Echo in 5/18 was similar with EF 55-60%, and echo in 12/21 showed EF 55-60% with moderate focal basal septal hypertrophy.  - NYHA I. Volume stable. On lasix 40 mg daily - Refilled her medications including lasix, coreg 25 mg BID, spironolactone 50 mg daily, hydralazine 100 mg TID, imdur 60 mg daily - BP significantly elevated. Stressed importance of better adherence with medical therapy. - BMET today 2. CAD: LHC 12/23/14 showed severe proximal RCA stenosis treated with Promus  DES.  She was a Plavix non-responder so was transitioned to Brilinta 90 mg bid => now off Brilinta.  No chest pain.  - Continue ASA 81.   - Last LDL was 151 in July 2022. Suspected she had been off Atorvastatin at that time - Currently out of Atorvastatin. Refilled.  - Repeat lipid panel in 6-8 weeks 3. HTN: She had marked AKI with ARB use in the past.  - BP significantly elevated. Likely combination of being off mediations and stress. Medications refilled. She will monitor BP at home - reiterated importance of medication compliance as above   4. CKD IIIa: AKI in past after starting ARB, but fully recovered.  No evidence for renal artery stenosis on renal artery dopplers.  Suspect some baseline renal damage from long-standing HTN and diabetes but recent creatinine within normal range.    - BMET today.  5. OSA: Continue CPAP.  6. Diabetes: Uncontrolled. - A1c 10.3 in June 2022 - Previously followed with Endocrine. She is no longer taking glipizide and novolin.  - Reports she is establishing with PCP this week at [facility]  Follow-up: 4 weeks in APP clinic to assess  need for further medication titration. If stable at that time, can likely follow-up in 6 months  Signed, Jacklynn Ganong, FNP  08/17/2021  [facility] [address] [facility] [place] [address] [phone] (office) [phone] (fax)

## 2021-08-20 ENCOUNTER — Encounter (HOSPITAL_COMMUNITY): Payer: 59

## 2021-08-27 ENCOUNTER — Telehealth (HOSPITAL_COMMUNITY): Payer: Self-pay

## 2021-08-27 NOTE — Telephone Encounter (Signed)
Called and left patient a voice message to confirm/remind patient of their appointment at the [facility] on 08/30/21.

## 2021-08-30 ENCOUNTER — Encounter (HOSPITAL_COMMUNITY): Payer: 59

## 2023-03-03 ENCOUNTER — Other Ambulatory Visit: Payer: Self-pay

## 2023-03-31 ENCOUNTER — Other Ambulatory Visit (HOSPITAL_COMMUNITY): Payer: Self-pay

## 2023-03-31 MED ORDER — TRULICITY 0.75 MG/0.5ML ~~LOC~~ SOAJ
0.7500 mg | SUBCUTANEOUS | 0 refills | Status: AC
  Filled 2023-03-31: qty 2, 28d supply, fill #0

## 2023-04-03 ENCOUNTER — Other Ambulatory Visit (HOSPITAL_COMMUNITY): Payer: Self-pay

## 2023-04-03 MED ORDER — TRESIBA FLEXTOUCH 100 UNIT/ML ~~LOC~~ SOPN
10.0000 [IU] | PEN_INJECTOR | Freq: Every day | SUBCUTANEOUS | 2 refills | Status: AC
  Filled 2023-04-03: qty 9, 90d supply, fill #0
  Filled 2023-04-06: qty 3, 30d supply, fill #0

## 2023-04-06 ENCOUNTER — Other Ambulatory Visit (HOSPITAL_COMMUNITY): Payer: Self-pay

## 2023-04-28 ENCOUNTER — Other Ambulatory Visit (HOSPITAL_COMMUNITY): Payer: Self-pay

## 2023-04-28 MED ORDER — TRULICITY 0.75 MG/0.5ML ~~LOC~~ SOAJ
0.7500 mg | SUBCUTANEOUS | 2 refills | Status: AC
  Filled 2023-04-28: qty 2, 28d supply, fill #0
  Filled 2023-05-24: qty 2, 28d supply, fill #1

## 2023-05-03 ENCOUNTER — Other Ambulatory Visit (HOSPITAL_COMMUNITY): Payer: Self-pay

## 2023-05-03 MED ORDER — PEG 3350-KCL-NABCB-NACL-NASULF 236 G PO SOLR
4000.0000 mL | ORAL | 0 refills | Status: AC
  Filled 2023-05-03: qty 4000, 1d supply, fill #0

## 2023-05-09 ENCOUNTER — Other Ambulatory Visit (HOSPITAL_COMMUNITY): Payer: Self-pay

## 2023-05-22 ENCOUNTER — Other Ambulatory Visit (HOSPITAL_COMMUNITY): Payer: Self-pay

## 2023-05-22 MED ORDER — CARVEDILOL 25 MG PO TABS
25.0000 mg | ORAL_TABLET | Freq: Every day | ORAL | 0 refills | Status: AC
  Filled 2023-05-22: qty 90, 90d supply, fill #0

## 2023-05-24 ENCOUNTER — Other Ambulatory Visit (HOSPITAL_COMMUNITY): Payer: Self-pay

## 2023-05-25 ENCOUNTER — Other Ambulatory Visit (HOSPITAL_COMMUNITY): Payer: Self-pay

## 2023-05-29 ENCOUNTER — Other Ambulatory Visit (HOSPITAL_COMMUNITY): Payer: Self-pay

## 2023-06-16 ENCOUNTER — Other Ambulatory Visit (HOSPITAL_COMMUNITY): Payer: Self-pay

## 2023-06-16 MED ORDER — LISINOPRIL 20 MG PO TABS
20.0000 mg | ORAL_TABLET | Freq: Every day | ORAL | 0 refills | Status: AC
  Filled 2023-06-16: qty 90, 90d supply, fill #0

## 2023-06-16 MED ORDER — AMLODIPINE BESYLATE 10 MG PO TABS
10.0000 mg | ORAL_TABLET | Freq: Every day | ORAL | 0 refills | Status: AC
  Filled 2023-06-16: qty 90, 90d supply, fill #0

## 2023-06-16 MED ORDER — JARDIANCE 25 MG PO TABS
25.0000 mg | ORAL_TABLET | Freq: Every day | ORAL | 2 refills | Status: AC
Start: 2023-06-16 — End: ?
  Filled 2023-06-16 – 2024-04-15 (×4): qty 30, 30d supply, fill #0

## 2023-06-16 MED ORDER — RYBELSUS 14 MG PO TABS
1.0000 | ORAL_TABLET | Freq: Every day | ORAL | 2 refills | Status: AC
Start: 2023-06-16 — End: ?
  Filled 2023-06-16: qty 30, 30d supply, fill #0

## 2023-08-22 ENCOUNTER — Other Ambulatory Visit (HOSPITAL_COMMUNITY): Payer: Self-pay

## 2023-08-22 MED ORDER — RYBELSUS 14 MG PO TABS
14.0000 mg | ORAL_TABLET | Freq: Every day | ORAL | 1 refills | Status: DC
Start: 2023-08-22 — End: 2024-02-16
  Filled 2023-08-22: qty 30, 30d supply, fill #0

## 2023-08-22 MED ORDER — JARDIANCE 25 MG PO TABS
25.0000 mg | ORAL_TABLET | Freq: Every day | ORAL | 1 refills | Status: AC
  Filled 2023-08-22: qty 90, 90d supply, fill #0
  Filled 2023-11-18: qty 90, 90d supply, fill #1

## 2023-08-23 ENCOUNTER — Other Ambulatory Visit (HOSPITAL_COMMUNITY): Payer: Self-pay

## 2023-08-23 MED ORDER — ATORVASTATIN CALCIUM 20 MG PO TABS
20.0000 mg | ORAL_TABLET | Freq: Every day | ORAL | 3 refills | Status: AC
  Filled 2023-08-23: qty 30, 30d supply, fill #0
  Filled 2024-04-04: qty 30, 30d supply, fill #1

## 2023-08-23 MED ORDER — GLIPIZIDE ER 5 MG PO TB24
5.0000 mg | ORAL_TABLET | Freq: Every day | ORAL | 3 refills | Status: DC
  Filled 2023-08-23: qty 30, 30d supply, fill #0
  Filled 2023-10-14: qty 30, 30d supply, fill #1
  Filled 2023-11-18: qty 30, 30d supply, fill #2

## 2023-08-25 ENCOUNTER — Other Ambulatory Visit (HOSPITAL_COMMUNITY): Payer: Self-pay

## 2023-10-30 ENCOUNTER — Other Ambulatory Visit (HOSPITAL_COMMUNITY): Payer: Self-pay

## 2023-10-30 MED ORDER — CARVEDILOL 25 MG PO TABS
25.0000 mg | ORAL_TABLET | Freq: Every day | ORAL | 1 refills | Status: AC
  Filled 2023-10-30: qty 90, 90d supply, fill #0
  Filled 2024-04-04: qty 90, 90d supply, fill #1

## 2023-10-30 MED ORDER — FUROSEMIDE 40 MG PO TABS
40.0000 mg | ORAL_TABLET | Freq: Every day | ORAL | 1 refills | Status: AC
  Filled 2023-10-30: qty 90, 90d supply, fill #0
  Filled 2024-04-15: qty 90, 90d supply, fill #1
  Filled ????-??-??: fill #1

## 2023-10-30 MED ORDER — ACCU-CHEK GUIDE W/DEVICE KIT
PACK | 0 refills | Status: AC
  Filled 2023-10-30: qty 1, 30d supply, fill #0

## 2023-10-30 MED ORDER — ACCU-CHEK SOFTCLIX LANCETS MISC
3 refills | Status: AC
  Filled 2023-10-30: qty 100, 90d supply, fill #0
  Filled 2024-03-25 – 2024-04-04 (×2): qty 100, 90d supply, fill #1

## 2023-10-30 MED ORDER — ACCU-CHEK GUIDE TEST VI STRP
ORAL_STRIP | 3 refills | Status: AC
  Filled 2023-10-30: qty 50, 87d supply, fill #0
  Filled 2023-12-27: qty 50, 87d supply, fill #1
  Filled 2024-04-04: qty 50, 30d supply, fill #1
  Filled 2024-05-23: qty 50, 30d supply, fill #2
  Filled 2024-07-23: qty 50, 12d supply, fill #3

## 2023-11-20 ENCOUNTER — Other Ambulatory Visit (HOSPITAL_COMMUNITY): Payer: Self-pay

## 2023-11-20 ENCOUNTER — Other Ambulatory Visit: Payer: Self-pay

## 2023-11-20 MED ORDER — GLIPIZIDE ER 5 MG PO TB24
5.0000 mg | ORAL_TABLET | Freq: Every day | ORAL | 1 refills | Status: AC
  Filled 2023-11-20: qty 90, 90d supply, fill #0
  Filled 2024-02-16 – 2024-04-04 (×3): qty 90, 90d supply, fill #1

## 2023-11-23 ENCOUNTER — Other Ambulatory Visit (HOSPITAL_COMMUNITY): Payer: Self-pay

## 2023-12-27 ENCOUNTER — Other Ambulatory Visit: Payer: Self-pay

## 2024-02-16 ENCOUNTER — Other Ambulatory Visit (HOSPITAL_COMMUNITY): Payer: Self-pay

## 2024-02-21 ENCOUNTER — Other Ambulatory Visit (HOSPITAL_COMMUNITY): Payer: Self-pay

## 2024-02-21 MED ORDER — TRULICITY 0.75 MG/0.5ML ~~LOC~~ SOAJ
0.7500 mg | SUBCUTANEOUS | 2 refills | Status: DC
Start: 2024-02-16 — End: 2024-05-20
  Filled 2024-02-21 – 2024-04-15 (×5): qty 2, 28d supply, fill #0

## 2024-02-23 ENCOUNTER — Other Ambulatory Visit (HOSPITAL_COMMUNITY): Payer: Self-pay

## 2024-02-26 ENCOUNTER — Other Ambulatory Visit (HOSPITAL_COMMUNITY): Payer: Self-pay

## 2024-02-27 ENCOUNTER — Other Ambulatory Visit (HOSPITAL_COMMUNITY): Payer: Self-pay

## 2024-03-25 ENCOUNTER — Encounter (HOSPITAL_COMMUNITY): Payer: Self-pay

## 2024-03-25 ENCOUNTER — Other Ambulatory Visit (HOSPITAL_COMMUNITY): Payer: Self-pay

## 2024-03-25 ENCOUNTER — Other Ambulatory Visit: Payer: Self-pay

## 2024-04-04 ENCOUNTER — Other Ambulatory Visit: Payer: Self-pay

## 2024-04-04 ENCOUNTER — Other Ambulatory Visit (HOSPITAL_COMMUNITY): Payer: Self-pay

## 2024-04-15 ENCOUNTER — Other Ambulatory Visit (HOSPITAL_COMMUNITY): Payer: Self-pay

## 2024-04-17 ENCOUNTER — Other Ambulatory Visit (HOSPITAL_COMMUNITY): Payer: Self-pay

## 2024-05-09 ENCOUNTER — Ambulatory Visit: Payer: Self-pay | Admitting: Cardiovascular Disease

## 2024-05-20 ENCOUNTER — Other Ambulatory Visit (HOSPITAL_COMMUNITY): Payer: Self-pay

## 2024-05-20 MED ORDER — SPIRONOLACTONE 50 MG PO TABS
50.0000 mg | ORAL_TABLET | Freq: Every day | ORAL | 0 refills | Status: AC
  Filled 2024-05-20: qty 10, 10d supply, fill #0

## 2024-05-20 MED ORDER — AMLODIPINE BESYLATE 10 MG PO TABS
10.0000 mg | ORAL_TABLET | Freq: Every day | ORAL | 0 refills | Status: AC
  Filled 2024-05-20: qty 10, 10d supply, fill #0

## 2024-05-23 ENCOUNTER — Other Ambulatory Visit (HOSPITAL_COMMUNITY): Payer: Self-pay

## 2024-05-28 ENCOUNTER — Other Ambulatory Visit (HOSPITAL_COMMUNITY): Payer: Self-pay

## 2024-06-28 ENCOUNTER — Ambulatory Visit: Payer: Self-pay | Attending: Cardiovascular Disease | Admitting: Cardiovascular Disease

## 2024-06-28 NOTE — Progress Notes (Deleted)
 " Cardiology Office Note:    Date:  06/28/2024   ID:  Misty  KALEYAH Miller, DOB [birth year], MRN [id number]  PCP:  Pcp, No   [facility] Providers Cardiologist:  None Advanced Heart Failure:  Misty Shuck, MD     Referring MD: No ref. provider found   No chief complaint on file.   History of Present Illness:    Misty Miller is a 63 y.o. female with a hx of CAD and heart failure, presenting for cardiac evaluation. Previously followed in the [facility], last in 2023. She has a long hx of poorly controlled HTN, chronic systolic HF with LVEF 35-40%, improved/normalized on follow-up with LVEF 55-60%. She has a history of AKI in the setting of ARB use and diuresis. She underwent PCI of the RCA in 2016.    Current Medications: Active Medications[1]   Allergies:   Patient has no known allergies.   ROS:   Please see the history of present illness.    All other systems reviewed and are negative.  EKGs/Labs/Other Studies Reviewed:    The following studies were reviewed today: Cardiac Studies & Procedures   ______________________________________________________________________________________________ CARDIAC CATHETERIZATION  CARDIAC CATHETERIZATION 12/23/2014  Conclusion 1. Mid RCA lesion, 70% stenosed. 2. Dist RCA lesion, 70% stenosed. 3. Prox RCA lesion, 95% stenosed. There is a 0% residual stenosis post intervention. 4. A drug-eluting stent was placed.   Highly tortuous and angulated dominant right coronary with hemodynamically significant proximal stenosis reduced from 90% to 0% with drug-eluting stent implantation.  Potentially significant mid and distal lesions were not treated due to severe mid vessel tortuosity that prevented assessment with the pressure catheter. This was due to the angulation in the mid vessel. This raised concern that the stent would not be able to traverse the angulation.  Future consideration of PCI of the mid and distal  vessel should be of the patient develops anginal symptoms or ischemia on myocardial perfusion imaging. She did develop angina during balloon inflation which she has never before felt.  Recommendations:   Aggressive risk factor modification  Aspirin  and Plavix   Discharge in a.m.  Management per Dr. Shuck  Findings Coronary Findings Diagnostic  Dominance: Right  Right Coronary Artery The lesion is type C tubular eccentric . The lesion is type C tubular eccentric . The lesion is type C discrete .  Intervention  Prox RCA lesion PCI The pre-interventional distal flow is normal (TIMI 3). Pre-stent angioplasty was performed. A drug-eluting stent was placed. Post-stent angioplasty was performed. Lesion length: 10 mm. Maximum pressure: 14 atm. The post-interventional distal flow is normal (TIMI 3). Pressure wire/FFR was performed on the lesion. FFR post intervention: 0.76. There is a 0% residual stenosis post intervention.   CARDIAC CATHETERIZATION 12/23/2014  Conclusion [name] has significant RCA disease with severe proximal stenosis and moderate mid and distal RCA stenosis.  I suspect this is the etiology of her periodic chest pain.  She may have had true ACS at prior admission with mildly elevated troponin. As there was no perfusion defect on Cardiolite , she will have FFR done.  If this suggests significant disease, she will undergo PCI. She is a reasonable candidate for DAPT.  Dr Claudene to do FFR and possible PCI.  Findings Coronary Findings Diagnostic  Dominance: Right  Left Main No significant disease.  Left Anterior Descending Luminal irregularities  Left Circumflex Luminal irregularities  Right Coronary Artery 80-90% discrete proximal RCA stenosis, 50% mid RCA stenosis, 60% distal RCA stenosis.  Intervention  No interventions have been documented.   STRESS TESTS  NM MYOCAR MULTI W/SPECT W 08/19/2014  Narrative CLINICAL DATA:  Chest pain  EXAM: MYOCARDIAL  IMAGING WITH SPECT (REST AND PHARMACOLOGIC-STRESS)  GATED LEFT VENTRICULAR WALL MOTION STUDY  LEFT VENTRICULAR EJECTION FRACTION  TECHNIQUE: Standard myocardial SPECT imaging was performed after resting intravenous injection of 10 mCi Tc-61m sestamibi. Subsequently, intravenous infusion of Lexiscan  was performed under the supervision of the Cardiology staff. At peak effect of the drug, 30 mCi Tc-69m sestamibi was injected intravenously and standard myocardial SPECT imaging was performed. Quantitative gated imaging was also performed to evaluate left ventricular wall motion, and estimate left ventricular ejection fraction.  COMPARISON:  None.  FINDINGS: Baseline EKG showed NSR with nonspecific T wave abnormality. During Lexiscan  infusion there were no changes from baseline EKG.  RAW images show mild breast attenuation artifact and increased gut uptake below the diaphragm.  Perfusion: No decreased activity in the left ventricle on stress imaging to suggest reversible ischemia or infarction.  Wall Motion: Dilated Left ventricular cavity with moderate to severely reduced LV function.  Left Ventricular Ejection Fraction: 35 %  End diastolic volume 149 ml  End systolic volume 97 ml  IMPRESSION: 1. No reversible ischemia or infarction.  2.  Moderate to severely reduced LV function.  3. Left ventricular ejection fraction 35%  4. Intermediate-risk stress test findings*.  *2012 Appropriate Use Criteria for Coronary Revascularization Focused Update: J Am Coll Cardiol. 2012;59(9):857-881. http://content.dementiazones.com.aspx?articleid=1201161   Electronically Signed By: Wilbert Bihari On: 08/19/2014 17:37   ECHOCARDIOGRAM  ECHOCARDIOGRAM COMPLETE 06/29/2020  Narrative ECHOCARDIOGRAM REPORT    Patient Name:   Misty Miller Date of Exam: 06/29/2020 Medical Rec #:  [id number]        Height:       61.0 in Accession #:    [id number]       Weight:       182.0  lb Date of Birth:  1961-02-18         BSA:          1.815 m Patient Age:    63 years         BP:           177/103 mmHg Patient Gender: F                HR:           62 bpm. Exam Location:  Outpatient  Procedure: 2D Echo, Cardiac Doppler and Color Doppler  Indications:    Congestive Heart Failure  History:        Patient has prior history of Echocardiogram examinations, most recent 11/14/2016. Risk Factors:Sleep Apnea, Hypertension, Diabetes and Dyslipidemia. CKD.  Sonographer:    Rome Eans RDCS (AE) Referring Phys: [id number] Misty RAMAN [facility]  IMPRESSIONS   1. Left ventricular ejection fraction, by estimation, is 55 to 60%. The left ventricle has normal function. The left ventricle has no regional wall motion abnormalities. There is moderate basal septal hypertrophy. There is mild LVH of the rest of the LV segments. Left ventricular diastolic parameters are consistent with Grade I diastolic dysfunction (impaired relaxation). Elevated left atrial pressure. 2. Right ventricular systolic function is normal. The right ventricular size is normal. 3. The mitral valve is normal in structure. Mild mitral valve regurgitation. No evidence of mitral stenosis. 4. The aortic valve is tricuspid. There is mild calcification of the aortic valve. There is mild thickening of the aortic valve. Aortic valve regurgitation is not  visualized. 5. The inferior vena cava is normal in size with greater than 50% respiratory variability, suggesting right atrial pressure of 3 mmHg.  Comparison(s): Compared to prior TTE in 2018, there is no significant change.  FINDINGS Left Ventricle: Left ventricular ejection fraction, by estimation, is 55 to 60%. The left ventricle has normal function. The left ventricle has no regional wall motion abnormalities. The left ventricular internal cavity size was normal in size. There is moderate basal-septal hypertrophy. There is mild LVH of the rest of the LV segments. Left ventricular  diastolic parameters are consistent with Grade I diastolic dysfunction (impaired relaxation). Elevated left atrial pressure. The E/e' is 16.4.  Right Ventricle: The right ventricular size is normal. No increase in right ventricular wall thickness. Right ventricular systolic function is normal.  Left Atrium: Left atrial size was normal in size.  Right Atrium: Right atrial size was normal in size.  Pericardium: There is no evidence of pericardial effusion.  Mitral Valve: The mitral valve is normal in structure. There is mild thickening of the mitral valve leaflet(s). There is mild calcification of the mitral valve leaflet(s). Mild mitral annular calcification. Mild mitral valve regurgitation. No evidence of mitral valve stenosis. MV peak gradient, 4.3 mmHg. The mean mitral valve gradient is 2.0 mmHg.  Tricuspid Valve: The tricuspid valve is normal in structure. Tricuspid valve regurgitation is trivial.  Aortic Valve: The aortic valve is tricuspid. There is mild calcification of the aortic valve. There is mild thickening of the aortic valve. Aortic valve regurgitation is not visualized. Aortic valve mean gradient measures 2.0 mmHg. Aortic valve peak gradient measures 4.5 mmHg. Aortic valve area, by VTI measures 2.15 cm.  Pulmonic Valve: The pulmonic valve was normal in structure. Pulmonic valve regurgitation is trivial.  Aorta: The aortic root and ascending aorta are structurally normal, with no evidence of dilitation.  Venous: The inferior vena cava is normal in size with greater than 50% respiratory variability, suggesting right atrial pressure of 3 mmHg.  IAS/Shunts: No atrial level shunt detected by color flow Doppler.   LEFT VENTRICLE PLAX 2D LVIDd:         4.00 cm     Diastology LVIDs:         2.90 cm     LV e' medial:    5.66 cm/s LV PW:         1.30 cm     LV E/e' medial:  16.4 LV IVS:        1.70 cm     LV e' lateral:   6.53 cm/s LVOT diam:     1.80 cm     LV E/e' lateral:  14.2 LV SV:         48 LV SV Index:   27 LVOT Area:     2.54 cm  LV Volumes (MOD) LV vol d, MOD A2C: 82.4 ml LV vol d, MOD A4C: 87.7 ml LV vol s, MOD A2C: 30.7 ml LV vol s, MOD A4C: 34.6 ml LV SV MOD A2C:     51.7 ml LV SV MOD A4C:     87.7 ml LV SV MOD BP:      52.0 ml  RIGHT VENTRICLE            IVC RV Basal diam:  2.50 cm    IVC diam: 1.30 cm RV S prime:     9.25 cm/s TAPSE (M-mode): 2.1 cm  LEFT ATRIUM  Index       RIGHT ATRIUM          Index LA diam:        3.20 cm 1.76 cm/m  RA Area:     8.15 cm LA Vol (A2C):   63.6 ml 35.05 ml/m RA Volume:   12.80 ml 7.05 ml/m LA Vol (A4C):   35.6 ml 19.62 ml/m LA Biplane Vol: 48.4 ml 26.67 ml/m AORTIC VALVE AV Area (Vmax):    1.96 cm AV Area (Vmean):   2.04 cm AV Area (VTI):     2.15 cm AV Vmax:           106.00 cm/s AV Vmean:          71.000 cm/s AV VTI:            0.224 m AV Peak Grad:      4.5 mmHg AV Mean Grad:      2.0 mmHg LVOT Vmax:         81.60 cm/s LVOT Vmean:        56.800 cm/s LVOT VTI:          0.189 m LVOT/AV VTI ratio: 0.84  AORTA Ao Root diam: 3.20 cm Ao Asc diam:  3.50 cm  MITRAL VALVE MV Area (PHT): 3.08 cm    SHUNTS MV Peak grad:  4.3 mmHg    Systemic VTI:  0.19 m MV Mean grad:  2.0 mmHg    Systemic Diam: 1.80 cm MV Vmax:       1.04 m/s MV Vmean:      59.9 cm/s MV Decel Time: 246 msec MV E velocity: 93.00 cm/s MV A velocity: 93.80 cm/s MV E/A ratio:  0.99  Powell Sorrow MD Electronically signed by Powell Sorrow MD Signature Date/Time: 06/29/2020/11:13:21 AM    Final          ______________________________________________________________________________________________      EKG:        Recent Labs: No results found for requested labs within last 365 days.  Recent Lipid Panel    Component Value Date/Time   CHOL 231 (H) 01/27/2021 1114   TRIG 197 (H) 01/27/2021 1114   HDL 41 01/27/2021 1114   CHOLHDL 5.6 01/27/2021 1114   VLDL 39 01/27/2021 1114    LDLCALC 151 (H) 01/27/2021 1114     Risk Assessment/Calculations:      No BP recorded.  {Refresh Note OR Click here to enter BP  :1}***         Physical Exam:    VS:  LMP  (LMP Unknown)     Wt Readings from Last 3 Encounters:  07/22/21 171 lb (77.6 kg)  01/27/21 177 lb 12.8 oz (80.6 kg)  12/22/20 175 lb (79.4 kg)     GEN:  Well nourished, well developed in no acute distress HEENT: Normal NECK: No JVD; No carotid bruits LYMPHATICS: No lymphadenopathy CARDIAC: RRR, no murmurs, rubs, gallops RESPIRATORY:  Clear to auscultation without rales, wheezing or rhonchi  ABDOMEN: Soft, non-tender, non-distended MUSCULOSKELETAL:  No edema; No deformity  SKIN: Warm and dry NEUROLOGIC:  Alert and oriented x 3 PSYCHIATRIC:  Normal affect   Assessment & Plan Coronary artery disease involving native coronary artery of native heart without angina pectoris  Essential hypertension  Mixed hyperlipidemia  Chronic heart failure with preserved ejection fraction (HFpEF) (HCC)        {Are you ordering a CV Procedure (e.g. stress test, cath, DCCV, TEE, etc)?   Press F2        :  [id number]}    Medication Adjustments/Labs and Tests Ordered: Current medicines are reviewed at length with the patient today.  Concerns regarding medicines are outlined above.  No orders of the defined types were placed in this encounter.  No orders of the defined types were placed in this encounter.   There are no Patient Instructions on file for this visit.   Signed, Ozell Fell, MD  06/28/2024 7:05 AM    [facility]    [1]  No outpatient medications have been marked as taking for the 06/28/24 encounter (Appointment) with Fell Ozell, MD.   "

## 2024-07-16 ENCOUNTER — Other Ambulatory Visit (HOSPITAL_COMMUNITY): Payer: Self-pay

## 2024-07-19 ENCOUNTER — Other Ambulatory Visit (HOSPITAL_COMMUNITY): Payer: Self-pay

## 2024-07-22 ENCOUNTER — Other Ambulatory Visit (HOSPITAL_COMMUNITY): Payer: Self-pay

## 2024-07-23 ENCOUNTER — Other Ambulatory Visit (HOSPITAL_COMMUNITY): Payer: Self-pay

## 2024-07-23 ENCOUNTER — Other Ambulatory Visit: Payer: Self-pay

## 2024-07-29 ENCOUNTER — Other Ambulatory Visit: Payer: Self-pay

## 2024-08-01 ENCOUNTER — Other Ambulatory Visit (HOSPITAL_COMMUNITY): Payer: Self-pay
